# Patient Record
Sex: Male | Born: 1980 | Race: White | Hispanic: No | Smoking: Former smoker
Health system: Southern US, Community
[De-identification: ages and names within clinical notes are randomized; demographics above are authoritative.]

## PROBLEM LIST (undated history)

## (undated) DIAGNOSIS — Z87442 Personal history of urinary calculi: Secondary | ICD-10-CM

## (undated) DIAGNOSIS — R7303 Prediabetes: Secondary | ICD-10-CM

## (undated) DIAGNOSIS — R0600 Dyspnea, unspecified: Secondary | ICD-10-CM

## (undated) DIAGNOSIS — K219 Gastro-esophageal reflux disease without esophagitis: Secondary | ICD-10-CM

## (undated) DIAGNOSIS — G4730 Sleep apnea, unspecified: Secondary | ICD-10-CM

## (undated) DIAGNOSIS — J189 Pneumonia, unspecified organism: Secondary | ICD-10-CM

## (undated) DIAGNOSIS — I10 Essential (primary) hypertension: Secondary | ICD-10-CM

## (undated) DIAGNOSIS — I219 Acute myocardial infarction, unspecified: Secondary | ICD-10-CM

## (undated) DIAGNOSIS — J45909 Unspecified asthma, uncomplicated: Secondary | ICD-10-CM

## (undated) DIAGNOSIS — D869 Sarcoidosis, unspecified: Secondary | ICD-10-CM

## (undated) DIAGNOSIS — I82409 Acute embolism and thrombosis of unspecified deep veins of unspecified lower extremity: Secondary | ICD-10-CM

## (undated) HISTORY — PX: NO PAST SURGERIES: SHX2092

## (undated) HISTORY — PX: CARDIAC CATHETERIZATION: SHX172

## (undated) HISTORY — DX: Sarcoidosis, unspecified: D86.9

---

## 2019-11-13 ENCOUNTER — Emergency Department (HOSPITAL_COMMUNITY): Payer: Managed Care, Other (non HMO)

## 2019-11-13 ENCOUNTER — Encounter (HOSPITAL_COMMUNITY): Payer: Self-pay | Admitting: *Deleted

## 2019-11-13 ENCOUNTER — Other Ambulatory Visit: Payer: Self-pay

## 2019-11-13 ENCOUNTER — Emergency Department (HOSPITAL_COMMUNITY)
Admission: EM | Admit: 2019-11-13 | Discharge: 2019-11-13 | Disposition: A | Discharge status: Home / Self Care | Payer: Managed Care, Other (non HMO) | Attending: Emergency Medicine | Admitting: Emergency Medicine

## 2019-11-13 DIAGNOSIS — R911 Solitary pulmonary nodule: Secondary | ICD-10-CM | POA: Diagnosis not present

## 2019-11-13 DIAGNOSIS — M79602 Pain in left arm: Secondary | ICD-10-CM

## 2019-11-13 DIAGNOSIS — R531 Weakness: Secondary | ICD-10-CM | POA: Diagnosis not present

## 2019-11-13 DIAGNOSIS — R072 Precordial pain: Secondary | ICD-10-CM | POA: Insufficient documentation

## 2019-11-13 DIAGNOSIS — I10 Essential (primary) hypertension: Secondary | ICD-10-CM | POA: Insufficient documentation

## 2019-11-13 DIAGNOSIS — R590 Localized enlarged lymph nodes: Secondary | ICD-10-CM | POA: Diagnosis not present

## 2019-11-13 DIAGNOSIS — J45909 Unspecified asthma, uncomplicated: Secondary | ICD-10-CM | POA: Insufficient documentation

## 2019-11-13 DIAGNOSIS — R0789 Other chest pain: Secondary | ICD-10-CM | POA: Diagnosis present

## 2019-11-13 DIAGNOSIS — R29898 Other symptoms and signs involving the musculoskeletal system: Secondary | ICD-10-CM

## 2019-11-13 HISTORY — DX: Unspecified asthma, uncomplicated: J45.909

## 2019-11-13 HISTORY — DX: Acute embolism and thrombosis of unspecified deep veins of unspecified lower extremity: I82.409

## 2019-11-13 HISTORY — DX: Essential (primary) hypertension: I10

## 2019-11-13 LAB — CBC WITH DIFFERENTIAL/PLATELET
Abs Immature Granulocytes: 0.02 10*3/uL (ref 0.00–0.07)
Basophils Absolute: 0.1 10*3/uL (ref 0.0–0.1)
Basophils Relative: 1 %
Eosinophils Absolute: 0.2 10*3/uL (ref 0.0–0.5)
Eosinophils Relative: 3 %
HCT: 45.7 % (ref 39.0–52.0)
Hemoglobin: 14.4 g/dL (ref 13.0–17.0)
Immature Granulocytes: 0 %
Lymphocytes Relative: 27 %
Lymphs Abs: 2 10*3/uL (ref 0.7–4.0)
MCH: 26.8 pg (ref 26.0–34.0)
MCHC: 31.5 g/dL (ref 30.0–36.0)
MCV: 84.9 fL (ref 80.0–100.0)
Monocytes Absolute: 0.6 10*3/uL (ref 0.1–1.0)
Monocytes Relative: 8 %
Neutro Abs: 4.4 10*3/uL (ref 1.7–7.7)
Neutrophils Relative %: 61 %
Platelets: 229 10*3/uL (ref 150–400)
RBC: 5.38 MIL/uL (ref 4.22–5.81)
RDW: 13.2 % (ref 11.5–15.5)
WBC: 7.3 10*3/uL (ref 4.0–10.5)
nRBC: 0 % (ref 0.0–0.2)

## 2019-11-13 LAB — COMPREHENSIVE METABOLIC PANEL
ALT: 24 U/L (ref 0–44)
AST: 21 U/L (ref 15–41)
Albumin: 4 g/dL (ref 3.5–5.0)
Alkaline Phosphatase: 54 U/L (ref 38–126)
Anion gap: 10 (ref 5–15)
BUN: 21 mg/dL — ABNORMAL HIGH (ref 6–20)
CO2: 27 mmol/L (ref 22–32)
Calcium: 8.7 mg/dL — ABNORMAL LOW (ref 8.9–10.3)
Chloride: 103 mmol/L (ref 98–111)
Creatinine, Ser: 1.57 mg/dL — ABNORMAL HIGH (ref 0.61–1.24)
GFR calc Af Amer: >60 mL/min (ref >60)
GFR calc non Af Amer: 54 mL/min — ABNORMAL LOW (ref >60)
Glucose, Bld: 95 mg/dL (ref 70–99)
Potassium: 3.9 mmol/L (ref 3.5–5.1)
Sodium: 140 mmol/L (ref 135–145)
Total Bilirubin: 1 mg/dL (ref 0.3–1.2)
Total Protein: 7.1 g/dL (ref 6.5–8.1)

## 2019-11-13 LAB — TROPONIN I (HIGH SENSITIVITY)
Troponin I (High Sensitivity): 8 ng/L (ref <18)
Troponin I (High Sensitivity): 9 ng/L (ref <18)

## 2019-11-13 MED ORDER — IOHEXOL 350 MG/ML SOLN
100.0000 mL | Freq: Once | INTRAVENOUS | Status: AC | PRN
  Administered 2019-11-13: 14:00:00 100 mL via INTRAVENOUS

## 2019-11-13 NOTE — ED Notes (Signed)
Discharge instructions including neuro and follow up CT discussed with pt. Pt verbalized understanding with no question at this time. Pt ambulatory. To go home with family.

## 2019-11-13 NOTE — ED Notes (Signed)
Pt. Not available for assessment.

## 2019-11-13 NOTE — ED Provider Notes (Signed)
[facility] Emergency Department Provider Note MRN:  [id number]  Arrival date & time: 11/13/19     Chief Complaint   Arm Pain and Shortness of Breath   History of Present Illness   Zachary Weeks is a 38 y.o. year-old male with a history of hypertension, DVT presenting to the ED with chief complaint of arm pain and shortness of breath.  Sudden onset chest pain, shortness of breath, left arm pain upon awakening this morning.  Also endorsing some decreased strength through the left hand and arm.  Sent here after ED evaluation at [facility] hospital for MRI imaging.  Denies nausea or vomiting, no vision change, no leg numbness or weakness, no bowel or bladder dysfunction, no abdominal pain.  Pain is mild, constant, described as an ache, worse with motion.  Review of Systems  A complete 10 system review of systems was obtained and all systems are negative except as noted in the HPI and PMH.   Patient's Health History    Past Medical History:  Diagnosis Date  . Asthma   . DVT (deep venous thrombosis) ([place])   . Hypertension     History reviewed. No pertinent surgical history.  No family history on file.  Social History   Socioeconomic History  . Marital status: Married    Spouse name: Not on file  . Number of children: Not on file  . Years of education: Not on file  . Highest education level: Not on file  Occupational History  . Not on file  Tobacco Use  . Smoking status: Never Smoker  . Smokeless tobacco: Current User    Types: Chew  Substance and Sexual Activity  . Alcohol use: Yes    Comment: OCCASSIONAL  . Drug use: Never  . Sexual activity: Not on file  Other Topics Concern  . Not on file  Social History Narrative  . Not on file   Social Determinants of Health   Financial Resource Strain:   . Difficulty of Paying Living Expenses: Not on file  Food Insecurity:   . Worried About Charity fundraiser in the Last Year: Not on file  . Ran Out of Food  in the Last Year: Not on file  Transportation Needs:   . Lack of Transportation (Medical): Not on file  . Lack of Transportation (Non-Medical): Not on file  Physical Activity:   . Days of Exercise per Week: Not on file  . Minutes of Exercise per Session: Not on file  Stress:   . Feeling of Stress : Not on file  Social Connections:   . Frequency of Communication with Friends and Family: Not on file  . Frequency of Social Gatherings with Friends and Family: Not on file  . Attends Religious Services: Not on file  . Active Member of Clubs or Organizations: Not on file  . Attends Archivist Meetings: Not on file  . Marital Status: Not on file  Intimate Partner Violence:   . Fear of Current or Ex-Partner: Not on file  . Emotionally Abused: Not on file  . Physically Abused: Not on file  . Sexually Abused: Not on file     Physical Exam  Vital Signs and Nursing Notes reviewed Vitals:   11/13/19 1729 11/13/19 2000  BP: 122/78 111/73  Pulse: 78 78  Resp: 14 16  Temp: 98.1 F (36.7 C)   SpO2: 93% 100%    CONSTITUTIONAL: Well-appearing, NAD NEURO:  Alert and oriented x 3, normal sensation,  decreased grip strength to the left arm, no slurred speech, no aphasia, no neglect EYES:  eyes equal and reactive ENT/NECK:  no LAD, no JVD CARDIO: Regular rate, well-perfused, normal S1 and S2 PULM:  CTAB no wheezing or rhonchi GI/GU:  normal bowel sounds, non-distended, non-tender MSK/SPINE:  No gross deformities, no edema SKIN:  no rash, atraumatic PSYCH:  Appropriate speech and behavior  Diagnostic and Interventional Summary    EKG Interpretation  Date/Time:  Monday November 13 2019 12:24:58 EST Ventricular Rate:  85 PR Interval:    QRS Duration: 98 QT Interval:  388 QTC Calculation: 462 R Axis:   36 Text Interpretation: Sinus rhythm No STEMI Confirmed by Nanda Quinton [phone]) on 11/13/2019 2:54:02 PM      Labs Reviewed  COMPREHENSIVE METABOLIC PANEL - Abnormal; Notable  for the following components:      Result Value   BUN 21 (*)    Creatinine, Ser 1.57 (*)    Calcium 8.7 (*)    GFR calc non Af Amer 54 (*)    All other components within normal limits  CBC WITH DIFFERENTIAL/PLATELET  TROPONIN I (HIGH SENSITIVITY)  TROPONIN I (HIGH SENSITIVITY)    MR BRAIN WO CONTRAST  Final Result    MR Cervical Spine Wo Contrast  Final Result    CT Angio Chest PE W and/or Wo Contrast  Final Result    CT Head Wo Contrast  Final Result    CT Cervical Spine Wo Contrast  Final Result      Medications  iohexol (OMNIPAQUE) 350 MG/ML injection 100 mL (100 mLs Intravenous Contrast Given 11/13/19 1409)     Procedures  /  Critical Care Procedures  ED Course and Medical Decision Making  I have reviewed the triage vital signs and the nursing notes.  Pertinent labs & imaging results that were available during my care of the patient were reviewed by me and considered in my medical decision making (see below for details).     Transferred here for MRI to evaluate for cervical myelopathy versus stroke to explain his left arm weakness.  MRI cervical spine revealing some foraminal stenosis but no cord compression.  MRI brain revealing possible demyelinating lesions but no acute stroke.  Patient is appropriate for neurology follow-up.  Work-up is otherwise reassuring, no evidence of PE or dissection on CTA chest.  Strict return precautions.  Barth Kirks. Sedonia Small, [place] [email or web address]  Final Clinical Impressions(s) / ED Diagnoses     ICD-10-CM   1. Left hand weakness  R29.898   2. Precordial chest pain  R07.2   3. Left arm pain  M79.602   4. Mediastinal adenopathy  R59.0   5. Pulmonary nodule  R91.1     ED Discharge Orders         Ordered    Ambulatory referral to Neurology    Comments: An appointment is requested in approximately: 1 week   11/13/19 2253           Discharge Instructions Discussed  with and Provided to Patient:     Discharge Instructions     You were seen in the emergency department today with weakness in the left hand, arm pain, chest discomfort.  We transferred her down to [facility] for MRI.   Your CT scan of the chest did not show blood clot in the lungs but did show enlarged lymph nodes in the chest as well as a nodule in the lungs.  Radiology is recommending a repeat CT scan in 6 months to make sure this is not changing or looking concerning.  Please call your primary doctor tomorrow to discuss scheduling these imaging tests and getting close follow-up.        Maudie Flakes, MD 11/13/19 2303

## 2019-11-13 NOTE — Discharge Instructions (Addendum)
You were seen in the emergency department today with weakness in the left hand, arm pain, chest discomfort.  We transferred her down to [facility] for MRI.   Your CT scan of the chest did not show blood clot in the lungs but did show enlarged lymph nodes in the chest as well as a nodule in the lungs.  Radiology is recommending a repeat CT scan in 6 months to make sure this is not changing or looking concerning.  Please call your primary doctor tomorrow to discuss scheduling these imaging tests and getting close follow-up.

## 2019-11-13 NOTE — ED Provider Notes (Signed)
Emergency Department Provider Note   I have reviewed the triage vital signs and the nursing notes.   HISTORY  Chief Complaint Arm Pain and Shortness of Breath   HPI Zachary Weeks is a 38 y.o. male with past medical history of hypertension, asthma, newly diagnosed DVT, started on Eliquis 2 weeks ago, presents to the emergency department with chest discomfort starting this morning.  He describes his discomfort as a tightness over his center to right chest.  No pleuritic pain.  Patient continues to have discomfort in the right leg but states it is not worsening.  He has been compliant with his Eliquis since being diagnosed with DVT in the right leg.  Patient states he woke up this morning and in addition to chest tightness had weakness and "shooting, electrical pain in the LUE.  Patient feels some radiation from the neck on the left side.  No similar pain in the past.  Denies numbness but states his grip strength seemed significantly weaker compared to yesterday.  He had difficulty opening his bedroom door because of weakness.  He denies any symptoms of weakness or numbness in the left lower extremity.  No vision changes or headache.   Patient states that while he has been taking his Eliquis he was also advised by his primary doctor to continue taking naproxen as needed for pain in the right leg which she has been doing.   Past Medical History:  Diagnosis Date  . Asthma   . DVT (deep venous thrombosis) ([place])   . Hypertension     There are no problems to display for this patient.   History reviewed. No pertinent surgical history.  Allergies Patient has no known allergies.  No family history on file.  Social History Social History   Tobacco Use  . Smoking status: Never Smoker  . Smokeless tobacco: Current User    Types: Chew  Substance Use Topics  . Alcohol use: Yes    Comment: OCCASSIONAL  . Drug use: Never    Review of Systems  Constitutional: No fever/chills Eyes: No  visual changes. ENT: No sore throat. Cardiovascular: Positive chest pain. Respiratory: Denies shortness of breath. Gastrointestinal: No abdominal pain. No nausea, no vomiting.  No diarrhea.  No constipation. Genitourinary: Negative for dysuria. Musculoskeletal: Negative for back pain. Positive left arm pain.   Skin: Negative for rash. Neurological: Negative for headaches or numbness. Positive left arm/hand weakness.   10-point ROS otherwise negative.  ____________________________________________   PHYSICAL EXAM:  VITAL SIGNS: ED Triage Vitals [11/13/19 1123]  Enc Vitals Group     BP 116/90     Pulse Rate 79     Resp 20     Temp 98.1 F (36.7 C)     Temp src      SpO2 98 %   Constitutional: Alert and oriented. Well appearing and in no acute distress. Eyes: Conjunctivae are normal.  Head: Atraumatic. Nose: No congestion/rhinnorhea. Mouth/Throat: Mucous membranes are moist.  Neck: No stridor.   Cardiovascular: Normal rate, regular rhythm. Good peripheral circulation. Grossly normal heart sounds.   Respiratory: Normal respiratory effort.  No retractions. Lungs CTAB. Gastrointestinal: Soft and nontender. No distention.  Musculoskeletal: No lower extremity tenderness nor edema. No gross deformities of extremities. Neurologic:  Normal speech and language. 4/5 grip strength on the left along with 4/5 biceps/triceps. No drift. 5/5 strength in the RUE and bilateral LEs.  Skin:  Skin is warm, dry and intact. No rash noted.  ____________________________________________   Reva Bores (  all labs ordered are listed, but only abnormal results are displayed)  Labs Reviewed  COMPREHENSIVE METABOLIC PANEL - Abnormal; Notable for the following components:      Result Value   BUN 21 (*)    Creatinine, Ser 1.57 (*)    Calcium 8.7 (*)    GFR calc non Af Amer 54 (*)    All other components within normal limits  CBC WITH DIFFERENTIAL/PLATELET  TROPONIN I (HIGH SENSITIVITY)  TROPONIN I (HIGH  SENSITIVITY)   ____________________________________________  EKG   EKG Interpretation  Date/Time:  Monday November 13 2019 12:24:58 EST Ventricular Rate:  85 PR Interval:    QRS Duration: 98 QT Interval:  388 QTC Calculation: 462 R Axis:   36 Text Interpretation: Sinus rhythm No STEMI Confirmed by Nanda Quinton [id number]) on 11/13/2019 2:54:02 PM       ____________________________________________  RADIOLOGY  CT Head Wo Contrast  Result Date: 11/13/2019 CLINICAL DATA:  Neurological deficit, acute, suspected stroke EXAM: CT HEAD WITHOUT CONTRAST CT CERVICAL SPINE WITHOUT CONTRAST TECHNIQUE: Multidetector CT imaging of the head and cervical spine was performed following the standard protocol without intravenous contrast. Multiplanar CT image reconstructions of the cervical spine were also generated. COMPARISON:  None FINDINGS: CT HEAD FINDINGS Brain: Normal ventricular morphology. No midline shift or mass effect. Normal appearance of brain parenchyma. No intracranial hemorrhage, mass lesion, or evidence of acute infarction. No extra-axial fluid collections. Vascular: No hyperdense vessels Skull: Intact Sinuses/Orbits: Clear Other: N/A CT CERVICAL SPINE FINDINGS Alignment: Normal Skull base and vertebrae: Beam hardening artifacts at lower cervical spine secondary to shoulders. Skull base intact. Mineralization normal. Vertebral body heights maintained. No fracture, subluxation, or bone destruction. Soft tissues and spinal canal: Prevertebral soft tissues normal thickness. Disc levels:  No additional abnormalities Upper chest: Lung apices clear Other: N/A IMPRESSION: Normal CT head. Normal CT cervical spine. Electronically Signed   By: Lavonia Dana M.D.   On: 11/13/2019 14:36   CT Angio Chest PE W and/or Wo Contrast  Result Date: 11/13/2019 CLINICAL DATA:  Left arm pain, shortness of breath EXAM: CT ANGIOGRAPHY CHEST WITH CONTRAST TECHNIQUE: Multidetector CT imaging of the chest was performed  using the standard protocol during bolus administration of intravenous contrast. Multiplanar CT image reconstructions and MIPs were obtained to evaluate the vascular anatomy. CONTRAST:  123mL OMNIPAQUE IOHEXOL 350 MG/ML SOLN COMPARISON:  07/28/2006 FINDINGS: Cardiovascular: No filling defects in the pulmonary arteries to suggest pulmonary emboli. Heart is normal size. Aorta is normal caliber. Mediastinum/Nodes: Mildly enlarged bilateral hilar and mediastinal lymph nodes. Prevascular lymph node has a short axis diameter of 14 mm. Subcarinal lymph node has a short axis diameter of 19 mm. Right paratracheal lymph node has a short axis diameter of 12 mm. No axillary adenopathy. Lungs/Pleura: Minimal right middle lobe density medially, favor scarring as there was airspace disease in this area on prior study. No acute confluent opacities or effusions. Upper Abdomen: Imaging into the upper abdomen shows no acute findings. Musculoskeletal: Chest wall soft tissues are unremarkable. No acute bony abnormality. Review of the MIP images confirms the above findings. IMPRESSION: Mild mediastinal and bilateral hilar adenopathy, nonspecific. This could be related to inflammatory processes such as sarcoidosis, but cannot exclude lymphoproliferative disorder/lymphoma. Recommend follow-up CT in 6 months to assess stability. 5 mm right lower lobe peripheral nodule. No follow-up needed if patient is low-risk. Non-contrast chest CT can be considered in 12 months if patient is high-risk. This recommendation follows the consensus statement: Guidelines for Management of Incidental  Pulmonary Nodules Detected on CT Images: From the Fleischner Society 2017; Radiology 2017; [phone]. Right middle lobe density medially, likely scarring. No evidence of pulmonary embolus. Electronically Signed   By: Rolm Baptise M.D.   On: 11/13/2019 14:32   CT Cervical Spine Wo Contrast  Result Date: 11/13/2019 CLINICAL DATA:  Neurological deficit, acute,  suspected stroke EXAM: CT HEAD WITHOUT CONTRAST CT CERVICAL SPINE WITHOUT CONTRAST TECHNIQUE: Multidetector CT imaging of the head and cervical spine was performed following the standard protocol without intravenous contrast. Multiplanar CT image reconstructions of the cervical spine were also generated. COMPARISON:  None FINDINGS: CT HEAD FINDINGS Brain: Normal ventricular morphology. No midline shift or mass effect. Normal appearance of brain parenchyma. No intracranial hemorrhage, mass lesion, or evidence of acute infarction. No extra-axial fluid collections. Vascular: No hyperdense vessels Skull: Intact Sinuses/Orbits: Clear Other: N/A CT CERVICAL SPINE FINDINGS Alignment: Normal Skull base and vertebrae: Beam hardening artifacts at lower cervical spine secondary to shoulders. Skull base intact. Mineralization normal. Vertebral body heights maintained. No fracture, subluxation, or bone destruction. Soft tissues and spinal canal: Prevertebral soft tissues normal thickness. Disc levels:  No additional abnormalities Upper chest: Lung apices clear Other: N/A IMPRESSION: Normal CT head. Normal CT cervical spine. Electronically Signed   By: Lavonia Dana M.D.   On: 11/13/2019 14:36    ____________________________________________   PROCEDURES  Procedure(s) performed:   Procedures  None  ____________________________________________   INITIAL IMPRESSION / ASSESSMENT AND PLAN / ED COURSE  Pertinent labs & imaging results that were available during my care of the patient were reviewed by me and considered in my medical decision making (see chart for details).   Patient presents to the emergency department for evaluation of chest discomfort along with shooting pain in the left arm.  He does have some weakness in grip and biceps/triceps on that side.  Considered aortic pathology but feel this is far less likely.  He has normal blood pressure here and no ripping/tearing chest pain.  Pulses are equal.  Plan  for troponin, CTA PE scan given known history of DVT.  Ordered MRI of the head and cervical spine given his focal neurologic deficit.  Patient is too large to fit in our MRI. Checked with MRI tech and he will fit at [facility].   02:50 PM  CT imaging of the chest, head, C-spine reviewed.  Patient with bilateral adenopathy with recommendation for follow-up in 6 months for repeat imaging.  Discussed this with the patient and included in the AVS.  Patient also with pulmonary nodule.  No PE.  Discussed with Dr. Vanita Panda at [facility] who agrees with ED to ED transfer for MRI of the head and cervical spine to rule out midbrain stroke versus C-spine stenosis/compression given the new left arm/hand weakness. ____________________________________________  FINAL CLINICAL IMPRESSION(S) / ED DIAGNOSES  Final diagnoses:  Left hand weakness  Precordial chest pain  Left arm pain  Mediastinal adenopathy  Pulmonary nodule    MEDICATIONS GIVEN DURING THIS VISIT:  Medications  iohexol (OMNIPAQUE) 350 MG/ML injection 100 mL (100 mLs Intravenous Contrast Given 11/13/19 1409)     Note:  This document was prepared using Dragon voice recognition software and may include unintentional dictation errors.  Nanda Quinton, MD, [facility] Emergency Medicine    Uchenna Seufert, [place], MD 11/13/19 [phone]

## 2019-11-13 NOTE — ED Triage Notes (Signed)
C/o left arm pain and shortness of breath onset today, states he has had muscle cramps for a week, history of DVT

## 2019-11-13 NOTE — ED Notes (Signed)
In room just as pt leaving for MRI

## 2019-11-13 NOTE — ED Notes (Signed)
To CT

## 2019-11-15 ENCOUNTER — Encounter: Payer: Self-pay | Admitting: Diagnostic Neuroimaging

## 2019-11-15 ENCOUNTER — Other Ambulatory Visit: Payer: Self-pay

## 2019-11-15 ENCOUNTER — Ambulatory Visit: Payer: Managed Care, Other (non HMO) | Admitting: Diagnostic Neuroimaging

## 2019-11-15 ENCOUNTER — Telehealth: Payer: Self-pay | Admitting: Diagnostic Neuroimaging

## 2019-11-15 VITALS — BP 142/92 | HR 98 | Temp 97.3°F | Ht 73.0 in | Wt 320.0 lb

## 2019-11-15 DIAGNOSIS — M79642 Pain in left hand: Secondary | ICD-10-CM

## 2019-11-15 DIAGNOSIS — R29898 Other symptoms and signs involving the musculoskeletal system: Secondary | ICD-10-CM

## 2019-11-15 NOTE — Telephone Encounter (Signed)
Called patient and advised him of Dr [employer] message. I advised he should get on [facility] schedule so he has an appointment. He can be put on wait list as well. I advised will let Lovena Le know to schedule him. Patient verbalized understanding, appreciation.

## 2019-11-15 NOTE — Telephone Encounter (Signed)
Patient needs NCV/EMG. I advised patient at check-out that the next available would be in February. Patient states that MD told him that testing would be first of the year. I advised patient that I would need to check with MD to see if he has something specific in mind for patient. Please advise

## 2019-11-15 NOTE — Progress Notes (Signed)
[facility]  PATIENT: Zachary Weeks DOB: 17-Mar-1981  REFERRING CLINICIAN: Caryl Bis, MD   HISTORY FROM: patient  REASON FOR VISIT: new consult    HISTORICAL  CHIEF COMPLAINT:  Chief Complaint  Patient presents with  . Left arm pain, left hand weakness    rm 7 New Pt, "symptoms started 3 days ago seen in ED yesterday for work up with imaging, recent dx DVT in leg"    HISTORY OF PRESENT ILLNESS:   38 year old male here for evaluation of left arm weakness and pain.  About 2 weeks ago patient went to PCP for right leg pain and swelling, diagnosed with right leg DVT.  He has been on Eliquis for past 2 weeks.  11/13/2019 patient woke up felt tightness in the chest and left arm pain and numbness.  He felt weakness in his left grip.  Patient went to the hospital for evaluation.  He was transferred to another hospital for MRI.  MRI of the brain and cervical spine showed no acute findings.  Left C7-T1 foraminal stenosis was noted.  Also patient was noted to have mediastinal lymphadenopathy on CT angiogram of the chest, planning to have follow-up with PCP.  Patient was stable and discharged home.  No prodromal factors.  No increase or change in physical activity, stress, medications or accidents.  Symptoms are stable.  Left arm continues to have some weakness in grip strength with intermittent shooting pain up his left arm to his left shoulder.  No shortness of breath or chest pain.    REVIEW OF SYSTEMS: Full 14 system review of systems performed and negative with exception of: As per HPI.  ALLERGIES: No Known Allergies  HOME MEDICATIONS: Outpatient Medications Prior to Visit  Medication Sig Dispense Refill  . albuterol (VENTOLIN HFA) 108 (90 Base) MCG/ACT inhaler Inhale 1-2 puffs into the lungs every 6 (six) hours as needed for wheezing or shortness of breath.    Marland Kitchen atorvastatin (LIPITOR) 20 MG tablet Take 20 mg by mouth daily.    Marland Kitchen ELIQUIS 5 MG TABS tablet  Take 5 mg by mouth 2 (two) times daily.    . pantoprazole (PROTONIX) 40 MG tablet Take 40 mg by mouth daily.    . valsartan-hydrochlorothiazide (DIOVAN-HCT) 160-12.5 MG tablet Take 1 tablet by mouth daily.    . naproxen (NAPROSYN) 500 MG tablet Take 500 mg by mouth 2 (two) times daily as needed for moderate pain.     No facility-administered medications prior to visit.    PAST MEDICAL HISTORY: Past Medical History:  Diagnosis Date  . Asthma   . DVT (deep venous thrombosis) ([place])   . Hypertension     PAST SURGICAL HISTORY: No past surgical history on file.  FAMILY HISTORY: Family History  Problem Relation Age of Onset  . Pulmonary fibrosis Mother   . Cancer Mother   . Cancer Father        prostate  . Hypertension Father   . Lupus Maternal Grandmother   . Other Maternal Grandmother        clotting disorder  . Fibromyalgia Maternal Aunt   . Other Other        clotting disorder    SOCIAL HISTORY: Social History   Socioeconomic History  . Marital status: Married    Spouse name: Not on file  . Number of children: 2  . Years of education: Not on file  . Highest education level: High school graduate  Occupational History  . Not on  file  Tobacco Use  . Smoking status: Never Smoker  . Smokeless tobacco: Current User    Types: Chew, Snuff  Substance and Sexual Activity  . Alcohol use: Yes    Comment: OCCASSIONAL  . Drug use: Never  . Sexual activity: Not on file  Other Topics Concern  . Not on file  Social History Narrative   Lives with family   Caffeine- occass soda   Social Determinants of Health   Financial Resource Strain:   . Difficulty of Paying Living Expenses: Not on file  Food Insecurity:   . Worried About Charity fundraiser in the Last Year: Not on file  . Ran Out of Food in the Last Year: Not on file  Transportation Needs:   . Lack of Transportation (Medical): Not on file  . Lack of Transportation (Non-Medical): Not on file  Physical Activity:     . Days of Exercise per Week: Not on file  . Minutes of Exercise per Session: Not on file  Stress:   . Feeling of Stress : Not on file  Social Connections:   . Frequency of Communication with Friends and Family: Not on file  . Frequency of Social Gatherings with Friends and Family: Not on file  . Attends Religious Services: Not on file  . Active Member of Clubs or Organizations: Not on file  . Attends Archivist Meetings: Not on file  . Marital Status: Not on file  Intimate Partner Violence:   . Fear of Current or Ex-Partner: Not on file  . Emotionally Abused: Not on file  . Physically Abused: Not on file  . Sexually Abused: Not on file     PHYSICAL EXAM  GENERAL EXAM/CONSTITUTIONAL: Vitals:  Vitals:   11/15/19 0821  BP: (!) 142/92  Pulse: 98  Temp: (!) 97.3 F (36.3 C)  Weight: (!) 320 lb (145.2 kg)  Height: 6\' 1"  (1.854 m)     Body mass index is 42.22 kg/m. Wt Readings from Last 3 Encounters:  11/15/19 (!) 320 lb (145.2 kg)     Patient is in no distress; well developed, nourished and groomed; neck is supple  CARDIOVASCULAR:  Examination of carotid arteries is normal; no carotid bruits  Regular rate and rhythm, no murmurs  Examination of peripheral vascular system by observation and palpation is normal  EYES:  Ophthalmoscopic exam of optic discs and posterior segments is normal; no papilledema or hemorrhages  No exam data present  MUSCULOSKELETAL:  Gait, strength, tone, movements noted in Neurologic exam below  NEUROLOGIC: MENTAL STATUS:  No flowsheet data found.  awake, alert, oriented to person, place and time  recent and remote memory intact  normal attention and concentration  language fluent, comprehension intact, naming intact  fund of knowledge appropriate  CRANIAL NERVE:   2nd - no papilledema on fundoscopic exam  2nd, 3rd, 4th, 6th - pupils equal and reactive to light, visual fields full to confrontation, extraocular  muscles intact, no nystagmus  5th - facial sensation symmetric  7th - facial strength symmetric  8th - hearing intact  9th - palate elevates symmetrically, uvula midline  11th - shoulder shrug symmetric  12th - tongue protrusion midline  MOTOR:   normal bulk and tone, full strength in the BUE, BLE; EXCEPT DECR LEFT HAND FINGER ABDUCTION AND LEFT HAND GRIP (DIGITS 3-5)  SENSORY:   normal and symmetric to light touch, temperature, vibration  COORDINATION:   finger-nose-finger, fine finger movements normal  REFLEXES:   deep  tendon reflexes TRACE and symmetric  GAIT/STATION:   narrow based gait     DIAGNOSTIC DATA (LABS, IMAGING, TESTING) - I reviewed patient records, labs, notes, testing and imaging myself where available.  Lab Results  Component Value Date   WBC 7.3 11/13/2019   HGB 14.4 11/13/2019   HCT 45.7 11/13/2019   MCV 84.9 11/13/2019   PLT 229 11/13/2019      Component Value Date/Time   NA 140 11/13/2019 1230   K 3.9 11/13/2019 1230   CL 103 11/13/2019 1230   CO2 27 11/13/2019 1230   GLUCOSE 95 11/13/2019 1230   BUN 21 (H) 11/13/2019 1230   CREATININE 1.57 (H) 11/13/2019 1230   CALCIUM 8.7 (L) 11/13/2019 1230   PROT 7.1 11/13/2019 1230   ALBUMIN 4.0 11/13/2019 1230   AST 21 11/13/2019 1230   ALT 24 11/13/2019 1230   ALKPHOS 54 11/13/2019 1230   BILITOT 1.0 11/13/2019 1230   GFRNONAA 54 (L) 11/13/2019 1230   GFRAA >60 11/13/2019 1230   No results found for: CHOL, HDL, LDLCALC, LDLDIRECT, TRIG, CHOLHDL No results found for: HGBA1C No results found for: VITAMINB12 No results found for: TSH   11/13/19 CTA CHEST - Mild mediastinal and bilateral hilar adenopathy, nonspecific. This could be related to inflammatory processes such as sarcoidosis, but cannot exclude lymphoproliferative disorder/lymphoma. Recommend follow-up CT in 6 months to assess stability. - 5 mm right lower lobe peripheral nodule. No follow-up needed if patient is low-risk.  Non-contrast chest CT can be considered in 12 months if patient is high-risk. This recommendation follows the consensus statement: Guidelines for Management of Incidental Pulmonary Nodules Detected on CT Images: From the Fleischner Society 2017; Radiology 2017; 284:228-243.  Right middle lobe density medially, likely scarring.  - No evidence of pulmonary embolus.  11/13/19 MRI brain [I reviewed images myself. Minimal gliosis, but not likely demyelinating. -VRP]  - No evidence of recent infarction, intracranial hemorrhage, or mass. Minimal foci of nonspecific gliosis/demyelination in the cerebral white matter.   11/13/19 MRI cervical spine [I reviewed images myself and agree with interpretation. Moderate left C7-T1 foraminal stenosis.  -VRP]  - Multilevel degenerative changes as detailed above. No high-grade canal stenosis. Left foraminal stenosis is greatest at C7-T1. There is no abnormal cord signal.    ASSESSMENT AND PLAN  38 y.o. year old male here with new onset left arm pain and weakness, on 11/13/2019.  Could represent peripheral nerve process (cervical radiculopathy, brachial plexopathy, ulnar neuropathy).  We will proceed with EMG nerve conduction study.  Also recommend follow-up with PCP regarding mediastinal lymphadenopathy and rule out autoimmune, inflammatory or neoplastic processes.  Ddx: left C8-T1 radiculopathy vs left ulnar neuropathy  1. Left arm weakness   2. Left hand pain     PLAN:  - check EMG/NCS - follow up mediastinal lymphadenopathy with PCP  Orders Placed This Encounter  Procedures  . NCV with EMG(electromyography)   Return for for NCV/EMG.    Penni Bombard, MD 0000000, 0000000 AM Certified in Neurology, Neurophysiology and Neuroimaging  [facility] [address] [place], [place] [address] [phone]

## 2019-11-15 NOTE — Patient Instructions (Signed)
-   check EMG/NCS (electrical nerve testing)  - follow up mediastinal lymphadenopathy (enlarged lymph notes) with Dr. Quillian Quince (PCP)

## 2019-11-15 NOTE — Telephone Encounter (Signed)
Pls advise pt that we will try to work patient in sooner (if any cancellations). Also will request if Dr Krista Blue has any availability. -VRP

## 2019-12-04 NOTE — Telephone Encounter (Signed)
Pt is scheduled on 12/06/19 with Dr. Jannifer Franklin.

## 2019-12-06 ENCOUNTER — Ambulatory Visit (INDEPENDENT_AMBULATORY_CARE_PROVIDER_SITE_OTHER): Payer: Managed Care, Other (non HMO) | Admitting: Neurology

## 2019-12-06 ENCOUNTER — Other Ambulatory Visit: Payer: Self-pay

## 2019-12-06 ENCOUNTER — Ambulatory Visit: Payer: Managed Care, Other (non HMO) | Admitting: Neurology

## 2019-12-06 ENCOUNTER — Encounter: Payer: Self-pay | Admitting: Neurology

## 2019-12-06 DIAGNOSIS — R29898 Other symptoms and signs involving the musculoskeletal system: Secondary | ICD-10-CM

## 2019-12-06 DIAGNOSIS — G540 Brachial plexus disorders: Secondary | ICD-10-CM

## 2019-12-06 DIAGNOSIS — M79642 Pain in left hand: Secondary | ICD-10-CM

## 2019-12-06 HISTORY — DX: Brachial plexus disorders: G54.0

## 2019-12-06 NOTE — Progress Notes (Signed)
Please refer to EMG and nerve conduction procedure note.  

## 2019-12-06 NOTE — Procedures (Signed)
     HISTORY:  Zachary Weeks is a 39 year old gentleman with a spontaneous onset in mid December of discomfort in the left shoulder and arm with onset of weakness in the left hand.  The pain has resolved but the left hand weakness has continued.  The patient also reports a history of enlarged lymph nodes in the chest.  The patient is being evaluated for a possible neuropathy or a cervical radiculopathy.  He denies any neck pain.  NERVE CONDUCTION STUDIES:  Nerve conduction studies were performed on both upper extremities. The distal motor latencies and motor amplitudes for the median and ulnar nerves were within normal limits. The nerve conduction velocities for these nerves were also normal. The sensory latencies for the median, radial, and ulnar nerves were normal. The F wave latencies for the ulnar nerves were within normal limits.   EMG STUDIES:  EMG study was performed on the left upper extremity:  The first dorsal interosseous muscle reveals 2 to 4 K units with decreased recruitment. 2+ positive waves were noted. The abductor pollicis brevis muscle reveals 2 to 4 K units with decreased recruitment. 1+ positive waves were noted. The extensor indicis proprius muscle reveals 1 to 3 K units with decreased recruitment. 2+ positive waves were noted. The pronator teres muscle reveals 2 to 3 K units with decreased recruitment. 3+ positive waves were noted. The biceps muscle reveals 1 to 2 K units with full recruitment. No fibrillations or positive waves were noted. The triceps muscle reveals 2 to 4 K units with full recruitment. No fibrillations or positive waves were noted. The anterior deltoid muscle reveals 2 to 3 K units with full recruitment. No fibrillations or positive waves were noted. The cervical paraspinal muscles were tested at 2 levels. No abnormalities of insertional activity were seen at either level tested. There was good relaxation.   IMPRESSION:  Nerve conduction studies  done on both upper extremities were within normal limits.  No evidence of neuropathy is seen.  EMG evaluation of the left upper extremity shows denervation below the elbow diffusely involving muscles innervated by the C7, C8, and T1 nerve roots.  Sparing of the triceps muscle however is seen.  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  Clinical correlation is required.  Jill Alexanders MD 12/06/2019 2:28 PM  [facility] [address] [place], [place] [address]  Phone [phone] Fax [phone]

## 2019-12-11 NOTE — Progress Notes (Signed)
[place]    Nerve / Sites Muscle Latency Ref. Amplitude Ref. Rel Amp Segments Distance Velocity Ref. Area    ms ms mV mV %  cm m/s m/s mVms  R Median - APB     Wrist APB 3.2 ?4.4 13.6 ?4.0 100 Wrist - APB 7   59.8     Upper arm APB 7.8  12.6  92.7 Upper arm - Wrist 23 50 ?49 55.0  L Median - APB     Wrist APB 3.1 ?4.4 10.8 ?4.0 100 Wrist - APB 7   43.0     Upper arm APB 7.1  10.7  99.3 Upper arm - Wrist 23 58 ?49 41.5  R Ulnar - ADM     Wrist ADM 3.0 ?3.3 3.1 ?6.0 100 Wrist - ADM 7   8.5     B.Elbow ADM 6.1  7.0  224 B.Elbow - Wrist 22 69 ?49 29.8     A.Elbow ADM 8.1  7.3  105 A.Elbow - B.Elbow 10 52 ?49 29.4         A.Elbow - Wrist      L Ulnar - ADM     Wrist ADM 2.9 ?3.3 8.2 ?6.0 100 Wrist - ADM 7   32.2     B.Elbow ADM 6.7  8.3  100 B.Elbow - Wrist 22 58 ?49 32.1     A.Elbow ADM 8.7  8.0  96.3 A.Elbow - B.Elbow 10 49 ?49 31.6         A.Elbow - Wrist                 SNC    Nerve / Sites Rec. Site Peak Lat Ref.  Amp Ref. Segments Distance Peak Diff Ref.    ms ms V V  cm ms ms  R Radial - Anatomical snuff box (Forearm)     Forearm Wrist 2.1 ?2.9 26 ?15 Forearm - Wrist 10    L Radial - Anatomical snuff box (Forearm)     Forearm Wrist 1.9 ?2.9 28 ?15 Forearm - Wrist 10    R Median, Ulnar - Transcarpal comparison     Median Palm Wrist 1.8 ?2.2 84 ?35 Median Palm - Wrist 8       Ulnar Palm Wrist 1.9 ?2.2 19 ?12 Ulnar Palm - Wrist 8          Median Palm - Ulnar Palm  -0.1 ?0.4  L Median, Ulnar - Transcarpal comparison     Median Palm Wrist 1.8 ?2.2 101 ?35 Median Palm - Wrist 8       Ulnar Palm Wrist 1.7 ?2.2 12 ?12 Ulnar Palm - Wrist 8          Median Palm - Ulnar Palm  0.1 ?0.4  R Median - Orthodromic (Dig II, Mid palm)     Dig II Wrist 2.9 ?3.4 12 ?10 Dig II - Wrist 13    L Median - Orthodromic (Dig II, Mid palm)     Dig II Wrist 2.8 ?3.4 19 ?10 Dig II - Wrist 13    R Ulnar - Orthodromic, (Dig V, Mid palm)     Dig V Wrist 2.4 ?3.1 5 ?5 Dig V - Wrist 11    L Ulnar - Orthodromic,  (Dig V, Mid palm)     Dig V Wrist 2.5 ?3.1 6 ?5 Dig V - Wrist 11  F  Wave    Nerve F Lat Ref.   ms ms  R Ulnar - ADM 28.1 ?32.0  L Ulnar - ADM 30.4 ?32.0

## 2020-01-04 ENCOUNTER — Encounter: Payer: Managed Care, Other (non HMO) | Admitting: Diagnostic Neuroimaging

## 2020-01-19 NOTE — Progress Notes (Signed)
Emg suggests brachial plexopathy. Patient also has mediastinal lymphadenopathy on CT chest. These could be related. Recommend PCP evaluation of lymphadenopathy. May need biopsy. -VRP

## 2020-02-12 ENCOUNTER — Telehealth: Payer: Self-pay | Admitting: *Deleted

## 2020-02-12 NOTE — Telephone Encounter (Signed)
Called patient and advised him the  Emg suggests brachial plexopathy. Dr Leta Baptist stated he also has mediastinal lymphadenopathy on CT chest. These could be related. Dr Leta Baptist recommends  PCP evaluation of lymphadenopathy. He may need biopsy. Patient stated his PCP didn't get copy of EMG results. EMG was ordered by [facility] hospital MD. I advised I will fax copy to PCP if he agrees; patient agreed. He state dhe has othre symptoms that are concerning him, and he wants to discuss with PCP. I advised him Il'l fax results today. Patient verbalized understanding, appreciation. EMG/NCS faxed to Dr Quillian Quince, f [phone].

## 2020-10-18 ENCOUNTER — Ambulatory Visit: Payer: Managed Care, Other (non HMO) | Admitting: Pulmonary Disease

## 2020-10-18 ENCOUNTER — Encounter: Payer: Self-pay | Admitting: Pulmonary Disease

## 2020-10-18 ENCOUNTER — Other Ambulatory Visit: Payer: Self-pay

## 2020-10-18 VITALS — BP 120/90 | HR 91 | Temp 97.9°F | Ht 72.0 in | Wt 335.0 lb

## 2020-10-18 DIAGNOSIS — R590 Localized enlarged lymph nodes: Secondary | ICD-10-CM | POA: Diagnosis not present

## 2020-10-18 DIAGNOSIS — G4733 Obstructive sleep apnea (adult) (pediatric): Secondary | ICD-10-CM | POA: Insufficient documentation

## 2020-10-18 DIAGNOSIS — N183 Chronic kidney disease, stage 3 unspecified: Secondary | ICD-10-CM | POA: Insufficient documentation

## 2020-10-18 DIAGNOSIS — N182 Chronic kidney disease, stage 2 (mild): Secondary | ICD-10-CM | POA: Diagnosis not present

## 2020-10-18 DIAGNOSIS — N1831 Chronic kidney disease, stage 3a: Secondary | ICD-10-CM

## 2020-10-18 DIAGNOSIS — D869 Sarcoidosis, unspecified: Secondary | ICD-10-CM | POA: Insufficient documentation

## 2020-10-18 NOTE — Assessment & Plan Note (Signed)
Differential of mediastinal and hilar lymphadenopathy includes benign condition such as sarcoidosis, less likely subacute infection since he has always lived in [place], unlikely malignancy and this never smoker, no other lymphadenopathy to suggest lymphoma and low-grade hypermetabolism on PET scan. He has mild oxygen desaturation not to the point of requiring oxygen.  He does not have any parenchymal involvement on imaging.  The strategy here would be to proceed with bronchoscopy with EBUS to see if we can diagnose sarcoidosis.  Skin lesions do not seem to be significant enough to biopsy.  We will also schedule PFTs to get a sense of baseline lung function.  If biopsy shows granulomas we will proceed with treatment with steroids

## 2020-10-18 NOTE — Assessment & Plan Note (Signed)
High pretest probability of OSA Proceed with home sleep testing   The pathophysiology of obstructive sleep apnea , it's cardiovascular consequences & modes of treatment including CPAP were discused with the patient in detail & they evidenced understanding.

## 2020-10-18 NOTE — Assessment & Plan Note (Signed)
Nephrology consultation in [place]

## 2020-10-18 NOTE — Patient Instructions (Addendum)
  Ambulatory satn Home sleep study has been scheduled Schedule PFTs Schedule bronchoscopy with biopsy of lymph nodes in [facility]  Renal consultation

## 2020-10-18 NOTE — Progress Notes (Signed)
Subjective:    Patient ID: Zachary Weeks, male    DOB: 1981/01/02, 39 y.o.   MRN: [id number]  HPI  Chief Complaint  Patient presents with  . Consult    Patient is here for shortness of breath and feels like he can't catch his breath sometimes. Dry cough, going on for a little over a year.     39 year old obese Man referred for evaluation of mediastinal and hilar lymphadenopathy. He presented to the ED 11/13/2019 with left arm pain, weakness and shortness of breath.  Underwent neurological evaluation and CT angiogram chest which picked up mediastinal and hilar lymphadenopathy -subcarinal, prevascular.  Subsequent neurological evaluation included EMG, MRI and no clear cause was found for his and weakness, this was attributed to brachial plexus involvement.  He also developed right lower extremity DVT in 10/2019 and was on Eliquis for a period of time.  Underwent hematology evaluation and detailed hypercoagulable work-up which was mostly negative except PCR-ABL positive with suggested CML.  He underwent bone marrow biopsy 02/2020 which did not show any evidence of leukemia and repeat BCR ABL was negative.  I have reviewed hematology evaluation. He also underwent PET scan 04/2020 which showed mild hypermetabolism in mediastinal and hilar lymphadenopathy favored to represent benign changes.  Repeat CT chest with contrast 08/2020 showed persistent enlarged lymph nodes  08/2020 BUN/creatinine 15/1.67  He reports shortness of breath on walking and other activities for the past year which has been stable and nonprogressive.  He also reports a dry cough.  Reports skin lesions on his right shin which he believes are healed tick bites no redness of eyes  He was asked by the hematologist discontinue Eliquis  Wife is noted loud snoring and he reports weight gain in the past 2 years.  No witnessed apneas or gasping or choking episodes in his sleep  PMH -reflux, hypertension, undergoing evaluation for sleep  apnea  Ambulatory saturation-oxygen saturation was 92% on room air at rest and desaturated 91% on walking Significant tests/ events reviewed  10/2019 CT angio chest >> Mild mediastinal and bilateral hilar adenopathy, Prevascular lymph node has a short axis diameter of 14 mm. Subcarinal lymph node has a short axis diameter of 19 mm. Right paratracheal lymph node has a short axis diameter of 12 mm. No axillary adenopathy. 5 mm right lower lobe peripheral nodule  PET 04/2020 >> Mediastinal and bilateral hilar lymphadenopathy with associated increased metabolic activity, favored to represent benign, reactive changes.  CT chest W con 08/2020 Unchanged enlarged mediastinal and bilateral hilar lymph nodes,  which remain nonspecific    Past Medical History:  Diagnosis Date  . Asthma   . DVT (deep venous thrombosis) ([place])   . Hypertension   . 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     Review of Systems   Shortness of breath with activity Nonproductive cough Acid heartburn Weight gain  Constitutional: negative for anorexia, fevers and sweats  Eyes: negative for irritation, redness and visual disturbance  Ears, nose, mouth, throat, and face: negative for earaches, epistaxis, nasal congestion and sore throat  Respiratory: negative for sputum and wheezing  Cardiovascular: negative for chest pain,  lower extremity edema, orthopnea, palpitations and syncope  Gastrointestinal: negative for abdominal pain, constipation, diarrhea, melena, nausea and vomiting  Genitourinary:negative for dysuria, frequency and hematuria  Hematologic/lymphatic: negative for bleeding, easy bruising and lymphadenopathy  Musculoskeletal:negative for arthralgias, muscle weakness and stiff joints  Neurological: negative for coordination problems, gait problems, headaches and weakness  Endocrine: negative for diabetic symptoms  including polydipsia, polyuria and weight loss     Objective:   Physical Exam  Gen. Pleasant,  obese, in no distress, normal affect ENT - no pallor,icterus, no post nasal drip, class 2-3 airway Neck: No JVD, no thyromegaly, no carotid bruits Lungs: no use of accessory muscles, no dullness to percussion, decreased without rales or rhonchi  Cardiovascular: Rhythm regular, heart sounds  normal, no murmurs or gallops, no peripheral edema Abdomen: soft and non-tender, no hepatosplenomegaly, BS normal. Musculoskeletal: No deformities, no cyanosis or clubbing Neuro:  alert, non focal, no tremors SKin- lesions on RT shin ? Healing tick bites      Assessment & Plan:

## 2020-10-18 NOTE — H&P (View-Only) (Signed)
Subjective:    Patient ID: Zachary Weeks, male    DOB: 02/28/1981, 39 y.o.   MRN: [id number]  HPI  Chief Complaint  Patient presents with  . Consult    Patient is here for shortness of breath and feels like he can't catch his breath sometimes. Dry cough, going on for a little over a year.     39 year old obese Man referred for evaluation of mediastinal and hilar lymphadenopathy. He presented to the ED 11/13/2019 with left arm pain, weakness and shortness of breath.  Underwent neurological evaluation and CT angiogram chest which picked up mediastinal and hilar lymphadenopathy -subcarinal, prevascular.  Subsequent neurological evaluation included EMG, MRI and no clear cause was found for his and weakness, this was attributed to brachial plexus involvement.  He also developed right lower extremity DVT in 10/2019 and was on Eliquis for a period of time.  Underwent hematology evaluation and detailed hypercoagulable work-up which was mostly negative except PCR-ABL positive with suggested CML.  He underwent bone marrow biopsy 02/2020 which did not show any evidence of leukemia and repeat BCR ABL was negative.  I have reviewed hematology evaluation. He also underwent PET scan 04/2020 which showed mild hypermetabolism in mediastinal and hilar lymphadenopathy favored to represent benign changes.  Repeat CT chest with contrast 08/2020 showed persistent enlarged lymph nodes  08/2020 BUN/creatinine 15/1.67  He reports shortness of breath on walking and other activities for the past year which has been stable and nonprogressive.  He also reports a dry cough.  Reports skin lesions on his right shin which he believes are healed tick bites no redness of eyes  He was asked by the hematologist discontinue Eliquis  Wife is noted loud snoring and he reports weight gain in the past 2 years.  No witnessed apneas or gasping or choking episodes in his sleep  PMH -reflux, hypertension, undergoing evaluation for sleep  apnea  Ambulatory saturation-oxygen saturation was 92% on room air at rest and desaturated 91% on walking Significant tests/ events reviewed  10/2019 CT angio chest >> Mild mediastinal and bilateral hilar adenopathy, Prevascular lymph node has a short axis diameter of 14 mm. Subcarinal lymph node has a short axis diameter of 19 mm. Right paratracheal lymph node has a short axis diameter of 12 mm. No axillary adenopathy. 5 mm right lower lobe peripheral nodule  PET 04/2020 >> Mediastinal and bilateral hilar lymphadenopathy with associated increased metabolic activity, favored to represent benign, reactive changes.  CT chest W con 08/2020 Unchanged enlarged mediastinal and bilateral hilar lymph nodes,  which remain nonspecific    Past Medical History:  Diagnosis Date  . Asthma   . DVT (deep venous thrombosis) ([place])   . Hypertension   . 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     Review of Systems   Shortness of breath with activity Nonproductive cough Acid heartburn Weight gain  Constitutional: negative for anorexia, fevers and sweats  Eyes: negative for irritation, redness and visual disturbance  Ears, nose, mouth, throat, and face: negative for earaches, epistaxis, nasal congestion and sore throat  Respiratory: negative for sputum and wheezing  Cardiovascular: negative for chest pain,  lower extremity edema, orthopnea, palpitations and syncope  Gastrointestinal: negative for abdominal pain, constipation, diarrhea, melena, nausea and vomiting  Genitourinary:negative for dysuria, frequency and hematuria  Hematologic/lymphatic: negative for bleeding, easy bruising and lymphadenopathy  Musculoskeletal:negative for arthralgias, muscle weakness and stiff joints  Neurological: negative for coordination problems, gait problems, headaches and weakness  Endocrine: negative for diabetic symptoms  including polydipsia, polyuria and weight loss     Objective:   Physical Exam  Gen. Pleasant,  obese, in no distress, normal affect ENT - no pallor,icterus, no post nasal drip, class 2-3 airway Neck: No JVD, no thyromegaly, no carotid bruits Lungs: no use of accessory muscles, no dullness to percussion, decreased without rales or rhonchi  Cardiovascular: Rhythm regular, heart sounds  normal, no murmurs or gallops, no peripheral edema Abdomen: soft and non-tender, no hepatosplenomegaly, BS normal. Musculoskeletal: No deformities, no cyanosis or clubbing Neuro:  alert, non focal, no tremors SKin- lesions on RT shin ? Healing tick bites      Assessment & Plan:

## 2020-10-23 ENCOUNTER — Telehealth: Payer: Self-pay | Admitting: *Deleted

## 2020-10-23 NOTE — Telephone Encounter (Signed)
-----   Message from Joellen Jersey sent at 10/21/2020  9:05 AM EST ----- Regarding: bronch/ebus Bronch/ebus@[facility] 11/01/20@7 :30am(only time to be done) covid 10/29/20@10 :15

## 2020-10-29 ENCOUNTER — Encounter: Payer: Self-pay | Admitting: Pulmonary Disease

## 2020-10-29 ENCOUNTER — Other Ambulatory Visit (HOSPITAL_COMMUNITY)
Admission: RE | Admit: 2020-10-29 | Discharge: 2020-10-29 | Disposition: A | Discharge status: Home / Self Care | Payer: Managed Care, Other (non HMO) | Source: Ambulatory Visit | Attending: Pulmonary Disease | Admitting: Pulmonary Disease

## 2020-10-29 DIAGNOSIS — Z20822 Contact with and (suspected) exposure to covid-19: Secondary | ICD-10-CM | POA: Diagnosis not present

## 2020-10-29 DIAGNOSIS — Z01812 Encounter for preprocedural laboratory examination: Secondary | ICD-10-CM | POA: Insufficient documentation

## 2020-10-29 LAB — SARS CORONAVIRUS 2 (TAT 6-24 HRS): SARS Coronavirus 2: NEGATIVE

## 2020-10-31 ENCOUNTER — Encounter (HOSPITAL_COMMUNITY): Payer: Self-pay | Admitting: Pulmonary Disease

## 2020-10-31 ENCOUNTER — Other Ambulatory Visit: Payer: Self-pay

## 2020-10-31 ENCOUNTER — Telehealth: Payer: Self-pay | Admitting: Pulmonary Disease

## 2020-10-31 NOTE — Telephone Encounter (Signed)
Spoke with the pt  He states that nobody from hospital has called him with pre procedure instructions for Bronch 11/01/20  We called Endo and spoke with person in charge today to let them know, and was advised that pt will be called today  Pt aware  Nothing further needed

## 2020-10-31 NOTE — Anesthesia Preprocedure Evaluation (Addendum)
Anesthesia Evaluation  Patient identified by MRN, date of birth, ID band Patient awake    Reviewed: Allergy & Precautions, NPO status , Patient's Chart, lab work & pertinent test results  Airway Mallampati: III  TM Distance: >3 FB Neck ROM: Full    Dental no notable dental hx.    Pulmonary asthma , former smoker,    Pulmonary exam normal breath sounds clear to auscultation       Cardiovascular hypertension, Pt. on medications + DVT  Normal cardiovascular exam Rhythm:Regular Rate:Normal  ECG: SR, rate 85   Neuro/Psych  Neuromuscular disease negative psych ROS   GI/Hepatic Neg liver ROS, GERD  Medicated and Controlled,  Endo/Other  Morbid obesity  Renal/GU negative Renal ROS     Musculoskeletal negative musculoskeletal ROS (+)   Abdominal (+) + obese,   Peds  Hematology hld   Anesthesia Other Findings BILATERAL LUNG MASS  Reproductive/Obstetrics                            Anesthesia Physical Anesthesia Plan  ASA: III  Anesthesia Plan: General   Post-op Pain Management:    Induction: Intravenous  PONV Risk Score and Plan: 2 and Ondansetron, Dexamethasone, Midazolam and Treatment may vary due to age or medical condition  Airway Management Planned: Oral ETT  Additional Equipment:   Intra-op Plan:   Post-operative Plan: Extubation in OR  Informed Consent: I have reviewed the patients History and Physical, chart, labs and discussed the procedure including the risks, benefits and alternatives for the proposed anesthesia with the patient or authorized representative who has indicated his/her understanding and acceptance.     Dental advisory given  Plan Discussed with: CRNA  Anesthesia Plan Comments:        Anesthesia Quick Evaluation

## 2020-10-31 NOTE — Progress Notes (Signed)
Mr. Zachary Weeks denies chest pain or shortness of breath at this time. Patient was tested for Covid and has been in quarantine since that time.  Mr. Zachary Weeks has shortness of breath at times and that is why he saw a Pulmonologist, patient's "mother died from Pulmonary Fibrosis and he wants to know early if he has it."

## 2020-11-01 ENCOUNTER — Ambulatory Visit (HOSPITAL_COMMUNITY): Payer: Managed Care, Other (non HMO) | Admitting: Physician Assistant

## 2020-11-01 ENCOUNTER — Encounter (HOSPITAL_COMMUNITY): Payer: Self-pay | Admitting: Pulmonary Disease

## 2020-11-01 ENCOUNTER — Encounter (HOSPITAL_COMMUNITY)
Admission: RE | Disposition: A | Discharge status: Home / Self Care | Payer: Self-pay | Source: Home / Self Care | Attending: Pulmonary Disease

## 2020-11-01 ENCOUNTER — Ambulatory Visit (HOSPITAL_COMMUNITY)
Admission: RE | Admit: 2020-11-01 | Discharge: 2020-11-01 | Disposition: A | Discharge status: Home / Self Care | Payer: Managed Care, Other (non HMO) | Attending: Pulmonary Disease | Admitting: Pulmonary Disease

## 2020-11-01 DIAGNOSIS — Z79899 Other long term (current) drug therapy: Secondary | ICD-10-CM | POA: Insufficient documentation

## 2020-11-01 DIAGNOSIS — Z6841 Body Mass Index (BMI) 40.0 and over, adult: Secondary | ICD-10-CM | POA: Diagnosis not present

## 2020-11-01 DIAGNOSIS — Z86718 Personal history of other venous thrombosis and embolism: Secondary | ICD-10-CM | POA: Insufficient documentation

## 2020-11-01 DIAGNOSIS — Z87891 Personal history of nicotine dependence: Secondary | ICD-10-CM | POA: Insufficient documentation

## 2020-11-01 DIAGNOSIS — R0602 Shortness of breath: Secondary | ICD-10-CM | POA: Diagnosis not present

## 2020-11-01 DIAGNOSIS — R059 Cough, unspecified: Secondary | ICD-10-CM | POA: Diagnosis not present

## 2020-11-01 DIAGNOSIS — R635 Abnormal weight gain: Secondary | ICD-10-CM | POA: Insufficient documentation

## 2020-11-01 DIAGNOSIS — R590 Localized enlarged lymph nodes: Secondary | ICD-10-CM | POA: Insufficient documentation

## 2020-11-01 DIAGNOSIS — K219 Gastro-esophageal reflux disease without esophagitis: Secondary | ICD-10-CM | POA: Insufficient documentation

## 2020-11-01 DIAGNOSIS — I10 Essential (primary) hypertension: Secondary | ICD-10-CM | POA: Insufficient documentation

## 2020-11-01 HISTORY — DX: Personal history of urinary calculi: Z87.442

## 2020-11-01 HISTORY — PX: VIDEO BRONCHOSCOPY: SHX5072

## 2020-11-01 HISTORY — DX: Dyspnea, unspecified: R06.00

## 2020-11-01 HISTORY — DX: Pneumonia, unspecified organism: J18.9

## 2020-11-01 HISTORY — PX: BRONCHIAL NEEDLE ASPIRATION BIOPSY: SHX5106

## 2020-11-01 HISTORY — DX: Gastro-esophageal reflux disease without esophagitis: K21.9

## 2020-11-01 HISTORY — PX: BRONCHIAL WASHINGS: SHX5105

## 2020-11-01 LAB — CBC
HCT: 43.7 % (ref 39.0–52.0)
Hemoglobin: 13.2 g/dL (ref 13.0–17.0)
MCH: 22.2 pg — ABNORMAL LOW (ref 26.0–34.0)
MCHC: 30.2 g/dL (ref 30.0–36.0)
MCV: 73.6 fL — ABNORMAL LOW (ref 80.0–100.0)
Platelets: 229 10*3/uL (ref 150–400)
RBC: 5.94 MIL/uL — ABNORMAL HIGH (ref 4.22–5.81)
RDW: 16.2 % — ABNORMAL HIGH (ref 11.5–15.5)
WBC: 6.2 10*3/uL (ref 4.0–10.5)
nRBC: 0 % (ref 0.0–0.2)

## 2020-11-01 LAB — BODY FLUID CELL COUNT WITH DIFFERENTIAL
Eos, Fluid: 1 %
Lymphs, Fluid: 3 %
Monocyte-Macrophage-Serous Fluid: 26 % — ABNORMAL LOW (ref 50–90)
Neutrophil Count, Fluid: 70 % — ABNORMAL HIGH (ref 0–25)
Total Nucleated Cell Count, Fluid: 30 cu mm (ref 0–1000)

## 2020-11-01 LAB — BASIC METABOLIC PANEL
Anion gap: 9 (ref 5–15)
BUN: 13 mg/dL (ref 6–20)
CO2: 28 mmol/L (ref 22–32)
Calcium: 8.8 mg/dL — ABNORMAL LOW (ref 8.9–10.3)
Chloride: 102 mmol/L (ref 98–111)
Creatinine, Ser: 1.4 mg/dL — ABNORMAL HIGH (ref 0.61–1.24)
GFR, Estimated: >60 mL/min (ref >60)
Glucose, Bld: 100 mg/dL — ABNORMAL HIGH (ref 70–99)
Potassium: 4.6 mmol/L (ref 3.5–5.1)
Sodium: 139 mmol/L (ref 135–145)

## 2020-11-01 SURGERY — VIDEO BRONCHOSCOPY WITHOUT FLUORO
Anesthesia: General

## 2020-11-01 MED ORDER — CHLORHEXIDINE GLUCONATE 0.12 % MT SOLN
OROMUCOSAL | Status: AC
  Administered 2020-11-01: 15 mL
  Filled 2020-11-01: qty 15

## 2020-11-01 MED ORDER — LIDOCAINE 2% (20 MG/ML) 5 ML SYRINGE
INTRAMUSCULAR | Status: DC | PRN
  Administered 2020-11-01: 60 mg via INTRAVENOUS

## 2020-11-01 MED ORDER — ROCURONIUM BROMIDE 10 MG/ML (PF) SYRINGE
PREFILLED_SYRINGE | INTRAVENOUS | Status: DC | PRN
  Administered 2020-11-01: 60 mg via INTRAVENOUS

## 2020-11-01 MED ORDER — OXYCODONE HCL 5 MG PO TABS: 5.0000 mg | ORAL_TABLET | Freq: Once | ORAL | Status: DC | PRN

## 2020-11-01 MED ORDER — PROMETHAZINE HCL 25 MG/ML IJ SOLN: 6.2500 mg | INTRAMUSCULAR | Status: DC | PRN

## 2020-11-01 MED ORDER — OXYCODONE HCL 5 MG/5ML PO SOLN: 5.0000 mg | Freq: Once | ORAL | Status: DC | PRN

## 2020-11-01 MED ORDER — FENTANYL CITRATE (PF) 100 MCG/2ML IJ SOLN
25.0000 ug | INTRAMUSCULAR | Status: DC | PRN
Start: 2020-11-01 — End: 2020-11-01

## 2020-11-01 MED ORDER — PROPOFOL 10 MG/ML IV BOLUS
INTRAVENOUS | Status: DC | PRN
  Administered 2020-11-01: 150 mg via INTRAVENOUS
  Administered 2020-11-01: 50 mg via INTRAVENOUS

## 2020-11-01 MED ORDER — ACETAMINOPHEN 10 MG/ML IV SOLN
1000.0000 mg | Freq: Once | INTRAVENOUS | Status: DC | PRN
Start: 2020-11-01 — End: 2020-11-01

## 2020-11-01 MED ORDER — FENTANYL CITRATE (PF) 100 MCG/2ML IJ SOLN
INTRAMUSCULAR | Status: DC | PRN
  Administered 2020-11-01: 100 ug via INTRAVENOUS

## 2020-11-01 MED ORDER — LACTATED RINGERS IV SOLN: INTRAVENOUS | Status: DC

## 2020-11-01 MED ORDER — ONDANSETRON HCL 4 MG/2ML IJ SOLN
INTRAMUSCULAR | Status: DC | PRN
  Administered 2020-11-01: 4 mg via INTRAVENOUS

## 2020-11-01 MED ORDER — SUGAMMADEX SODIUM 200 MG/2ML IV SOLN
INTRAVENOUS | Status: DC | PRN
  Administered 2020-11-01: 400 mg via INTRAVENOUS

## 2020-11-01 MED ORDER — MIDAZOLAM HCL 2 MG/2ML IJ SOLN
INTRAMUSCULAR | Status: DC | PRN
  Administered 2020-11-01: 2 mg via INTRAVENOUS

## 2020-11-01 MED ORDER — DEXAMETHASONE SODIUM PHOSPHATE 10 MG/ML IJ SOLN
INTRAMUSCULAR | Status: DC | PRN
  Administered 2020-11-01: 5 mg via INTRAVENOUS

## 2020-11-01 NOTE — Op Note (Signed)
  Name:  [name] MRN:  [id number] DOB:  February 13, 1981  PROCEDURE NOTE  Procedure(s): Flexible bronchoscopy [phone]) Bronchial alveolar lavage [phone]) of the RUL Endobronchial ultrasound ([id number]) Transbronchial needle aspiration (67672) of the subcarinal station 7 LN   Indications:  Hilar / mediastinal lymphadenopathy.  Consent:  Written informed consent was obtained prior to the procedure. The risks of the procedure including coughing, bleeding and the small chance of lung puncture requiring chest tube were discussed in great detail. The benefits & alternatives including serial follow up were also discussed.  Anesthesia:  General endotracheal.  Procedure summary:  Appropriate equipment was assembled.  The patient was  identified as Zachary Weeks. Interim history obtained and brought to the operating room. Safety timeout was performed. The patient was placed supine on the operating table, airway established and general anesthesia administered by Anesthesia team.   After the appropriate level of anesthesia was assured, flexible video bronchoscope was lubricated and inserted through the endotracheal tube.    Airway examination was performed bilaterally to subsegmental level.  Minimal clear secretions were noted, mucosa appeared normal and no endobronchial lesions were identified.  Endobronchial ultrasound video bronchoscope was then lubricated and inserted through the endotracheal tube. Surveillance of the mediastinal and and bilateral hilar lymph node stations was performed.  Pathologically enlarged lymph nodes were noted at station 7 & staion 4  Endobronchial ultrasound guided transbronchial needle aspiration of station7  (passes x 6-7), was performed, after which EBUS bronchoscope was withdrawn.  Flexible video bronchoscope was used again to perform rinspection.  After ensuring hemostasis , the bronchoscope was withdrawn.  The patient was extubated in operating room and transferred to  PACU.   Specimens sent: Bronchial alveolar lavage specimen of the RUL for  microbiology and cell count TBNA for cytology - prelim - lymphocytes  Complications:  No immediate complications were noted.  Hemodynamic parameters and oxygenation remained stable throughout the procedure.  Estimated blood loss:  Less then 5 mL.   Kara Mead MD. Shade Flood. [facility] Pager [phone] If no response call [phone]   11/01/2020 8:30 AM

## 2020-11-01 NOTE — Anesthesia Procedure Notes (Signed)
Procedure Name: Intubation Date/Time: 11/01/2020 7:45 AM Performed by: Dorthea Cove, CRNA Pre-anesthesia Checklist: Patient identified, Emergency Drugs available, Suction available and Patient being monitored Patient Re-evaluated:Patient Re-evaluated prior to induction Oxygen Delivery Method: Circle system utilized Preoxygenation: Pre-oxygenation with 100% oxygen Induction Type: IV induction Ventilation: Mask ventilation without difficulty and Two handed mask ventilation required Laryngoscope Size: Glidescope and 4 Grade View: Grade I Tube type: Oral Tube size: 8.5 mm Number of attempts: 1 Airway Equipment and Method: Stylet and Oral airway Placement Confirmation: ETT inserted through vocal cords under direct vision,  positive ETCO2 and breath sounds checked- equal and bilateral Secured at: 24 cm Tube secured with: Tape Dental Injury: Teeth and Oropharynx as per pre-operative assessment

## 2020-11-01 NOTE — Discharge Instructions (Signed)
Biopsy results will be called in to you in 1 week

## 2020-11-01 NOTE — Transfer of Care (Signed)
Immediate Anesthesia Transfer of Care Note  Patient: Zachary Weeks  Procedure(s) Performed: VIDEO BRONCHOSCOPY WITH ENDOBRONCHIAL ULTRASOUND (N/A ) BRONCHIAL NEEDLE ASPIRATION BIOPSIES BRONCHIAL WASHINGS  Patient Location: PACU  Anesthesia Type:General  Level of Consciousness: awake, alert  and oriented  Airway & Oxygen Therapy: Patient Spontanous Breathing and Patient connected to face mask oxygen  Post-op Assessment: Report given to RN and Post -op Vital signs reviewed and stable  Post vital signs: Reviewed and stable  Last Vitals:  Vitals Value Taken Time  BP 130/61 11/01/20 0851  Temp    Pulse 105 11/01/20 0852  Resp 18 11/01/20 0852  SpO2 95 % 11/01/20 0852  Vitals shown include unvalidated device data.  Last Pain:  Vitals:   11/01/20 0617  TempSrc: Oral  PainSc: 0-No pain         Complications: No complications documented.

## 2020-11-01 NOTE — Interval H&P Note (Signed)
History and Physical Interval Note:  11/01/2020 7:26 AM  Zachary Weeks  has presented today for surgery, with the diagnosis of BILATERAL LUNG MASS.  The various methods of treatment have been discussed with the patient and family. After consideration of risks, benefits and other options for treatment, the patient has consented to  Procedure(s): [place] (N/A) as a surgical intervention.  The patient's history has been reviewed, patient examined, no change in status, stable for surgery.  I have reviewed the patient's chart and labs.  Questions were answered to the patient's satisfaction.     Leanna Sato Elsworth Soho

## 2020-11-02 LAB — ACID FAST SMEAR (AFB, MYCOBACTERIA): Acid Fast Smear: NEGATIVE

## 2020-11-02 NOTE — Anesthesia Postprocedure Evaluation (Signed)
Anesthesia Post Note  Patient: Zachary Weeks  Procedure(s) Performed: VIDEO BRONCHOSCOPY WITH ENDOBRONCHIAL ULTRASOUND (N/A ) BRONCHIAL NEEDLE ASPIRATION BIOPSIES BRONCHIAL WASHINGS     Patient location during evaluation: PACU Anesthesia Type: General Level of consciousness: awake Pain management: pain level controlled Vital Signs Assessment: post-procedure vital signs reviewed and stable Respiratory status: spontaneous breathing, nonlabored ventilation, respiratory function stable and patient connected to nasal cannula oxygen Cardiovascular status: blood pressure returned to baseline and stable Postop Assessment: no apparent nausea or vomiting Anesthetic complications: no   No complications documented.  Last Vitals:  Vitals:   11/01/20 0951 11/01/20 0955  BP: 106/77   Pulse: 96 93  Resp: 18 15  Temp:  36.8 C  SpO2: 94% 95%    Last Pain:  Vitals:   11/01/20 0915  TempSrc:   PainSc: 0-No pain                 Eulala Newcombe P Trew Sunde

## 2020-11-03 ENCOUNTER — Encounter (HOSPITAL_COMMUNITY): Payer: Self-pay | Admitting: Pulmonary Disease

## 2020-11-04 LAB — CYTOLOGY - NON PAP

## 2020-11-06 ENCOUNTER — Telehealth: Payer: Self-pay | Admitting: Pulmonary Disease

## 2020-11-06 DIAGNOSIS — R0602 Shortness of breath: Secondary | ICD-10-CM

## 2020-11-06 NOTE — Telephone Encounter (Signed)
Called and spoke with pt to confirm that RA had called and spoken with him and he stated that he had. Stated to pt that he could go to [facility] for the cxr and he verbalized understanding. Order has been placed for the cxr. Nothing further needed.

## 2020-11-06 NOTE — Telephone Encounter (Signed)
He has mild shortness of breath, wonders if this is residual effects of anesthesia. He was seen to desaturate under anesthesia suggestive of OSA.  He is undergoing evaluation by [facility] but has not had a sleep study yet.  Please place order for him to get chest x-ray at [facility] so that I can review by tomorrow. He will check his O2 saturation with an oximeter and let us know if low He will call us back in 1 week if he is unable to schedule sleep study with [facility]

## 2020-11-06 NOTE — Progress Notes (Signed)
Per verbal from Dr Elsworth Soho, Dr Elsworth Soho spoke with patient on the phone today (11/06/2020) and went over this lab result. Nothing further needed at this time.

## 2020-11-06 NOTE — Telephone Encounter (Signed)
Primary Pulmonologist: Elsworth Soho Last office visit and with whom: 10/18/2020 What do we see them for (pulmonary problems): Bilateral hilar adenophathy syndrome and OSa Last OV assessment/plan: 10/18/2020 Assessment & Plan Note by Rigoberto Noel, MD at 10/18/2020 12:40 PM  Author: Rigoberto Noel, MD Author Type: Physician Filed: 10/18/2020 12:42 PM  Note Status: Written Cosign: Cosign Not Required Encounter Date: 10/18/2020  Problem: Mediastinal lymphadenopathy  Editor: Rigoberto Noel, MD (Physician)               Differential of mediastinal and hilar lymphadenopathy includes benign condition such as sarcoidosis, less likely subacute infection since he has always lived in [place], unlikely malignancy and this never smoker, no other lymphadenopathy to suggest lymphoma and low-grade hypermetabolism on PET scan. He has mild oxygen desaturation not to the point of requiring oxygen.  He does not have any parenchymal involvement on imaging.  The strategy here would be to proceed with bronchoscopy with EBUS to see if we can diagnose sarcoidosis.  Skin lesions do not seem to be significant enough to biopsy.  We will also schedule PFTs to get a sense of baseline lung function.  If biopsy shows granulomas we will proceed with treatment with steroids        Patient Instructions by Rigoberto Noel, MD at 10/18/2020 11:30 AM  Author: Rigoberto Noel, MD Author Type: Physician Filed: 10/18/2020 12:24 PM  Note Status: Addendum Mickle Mallory: Cosign Not Required Encounter Date: 10/18/2020  Editor: Rigoberto Noel, MD (Physician)      Prior Versions: 1. Rigoberto Noel, MD (Physician) at 10/18/2020 12:23 PM - Signed     Ambulatory satn Home sleep study has been scheduled Schedule PFTs Schedule bronchoscopy with biopsy of lymph nodes in [facility]  Renal consultation        Instructions     Return in about 2 months (around 12/18/2020).   Ambulatory satn Home sleep study has been  scheduled Schedule PFTs Schedule bronchoscopy with biopsy of lymph nodes in [facility]  Renal consultation        After Visit Summary (Printed 10/18/2020)     Reason for call:  Patient states he has been having increase sob since his bronch on 10/31/2020, now when he stands up he gets sob and he has some dizziness.  He thinks it is possibly related to the anesthesia he received.  He is unable to check his oxygen saturations at home.  He is having no other symptoms.  Dr. Elsworth Soho, please advise.  Thank you.  (examples of things to ask: : When did symptoms start? Fever? Cough? Productive? Color to sputum? More sputum than usual? Wheezing? Have you needed increased oxygen? Are you taking your respiratory medications? What over the counter measures have you tried?)  No Known Allergies  Immunization History  Administered Date(s) Administered  . Moderna Sars-Covid-2 Vaccination 06/03/2020, 07/01/2020  . Tdap 08/13/1995, 08/13/2020

## 2020-11-07 ENCOUNTER — Ambulatory Visit (HOSPITAL_COMMUNITY)
Admission: RE | Admit: 2020-11-07 | Discharge: 2020-11-07 | Disposition: A | Discharge status: Home / Self Care | Payer: Managed Care, Other (non HMO) | Source: Ambulatory Visit | Attending: Pulmonary Disease | Admitting: Pulmonary Disease

## 2020-11-07 ENCOUNTER — Other Ambulatory Visit: Payer: Self-pay

## 2020-11-07 DIAGNOSIS — R0602 Shortness of breath: Secondary | ICD-10-CM

## 2020-11-07 NOTE — Telephone Encounter (Signed)
Please let him know that chest x-ray appears clear.  No evidence of damage from the procedure

## 2020-12-04 LAB — FUNGUS CULTURE WITH STAIN

## 2020-12-04 LAB — FUNGUS CULTURE RESULT

## 2020-12-04 LAB — FUNGAL ORGANISM REFLEX

## 2020-12-13 ENCOUNTER — Other Ambulatory Visit: Payer: Self-pay

## 2020-12-13 ENCOUNTER — Ambulatory Visit: Payer: Managed Care, Other (non HMO) | Admitting: Pulmonary Disease

## 2020-12-13 ENCOUNTER — Encounter: Payer: Self-pay | Admitting: Pulmonary Disease

## 2020-12-13 DIAGNOSIS — R590 Localized enlarged lymph nodes: Secondary | ICD-10-CM | POA: Diagnosis not present

## 2020-12-13 DIAGNOSIS — G4733 Obstructive sleep apnea (adult) (pediatric): Secondary | ICD-10-CM

## 2020-12-13 NOTE — Patient Instructions (Signed)
°  Repeat CT chest W con in may 2022  Check CPAP report & sleep study  with [facility] We will take care of CPAP supplies as needed

## 2020-12-13 NOTE — Assessment & Plan Note (Signed)
He was diagnosed with severe OSA on CPAP is already helping him within 2 weeks.  He had a luna device severe unable to obtain a download today we will asked DME to obtain it for [place] and reviewed to make sure the settings are okay. He likes his full facemask and seems to have settled down well which is a good prognostic sign I would be curious to see if his pedal edema improves.  He is already on thiazide diuretic and we can continue that if hypoxia worsens or shortness of breath we can consider a trial of Lasix  Weight loss encouraged, compliance with goal of at least 4-6 hrs every night is the expectation. Advised against medications with sedative side effects Cautioned against driving when sleepy - understanding that sleepiness will vary on a day to day basis

## 2020-12-13 NOTE — Progress Notes (Signed)
   Subjective:    Patient ID: Zachary Weeks, male    DOB: [birth year], 40 y.o.   MRN: [id number]  HPI  40 year old obese Man  for FU of mediastinal and hilar lymphadenopathy & OSA   PMH - RLE DVT in 10/2019   Underwent hematology evaluation and detailed hypercoagulable work-up which was mostly negative except PCR-ABL positive .  bone marrow biopsy 02/2020 neg  Chief Complaint  Patient presents with  . Follow-up    No complaints currently    We discussed bronchoscopy results.  All of some shortness of breath, chest x-ray did not show any complications.  He underwent sleep study and was told he has severe OSA, he received Luna CPAP device from [facility].  Seems like he received an AirFit F 30 fullface mask which has really helped.  He is compliant he feels better has more energy feels like breathing is improved too Oxygen saturation 93% today he has hardly used albuterol inhaler  Significant tests/ events reviewed  HST 11/2020 [facility] severe OSA TBNA 10/2020 neg, scant lymphoid tissue  10/18/20 Ambulatory saturation-oxygen saturation was 92% on room air at rest and desaturated 91% on walking   10/2019 CT angio chest >> Mild mediastinal and bilateral hilar adenopathy, Prevascular lymph node has a short axis diameter of 14 mm. Subcarinal lymph node has a short axis diameter of 19 mm. Right paratracheal lymph node has a short axis diameter of 12 mm. No axillary adenopathy. 5 mm right lower lobe peripheral nodule  PET 04/2020 >> Mediastinal and bilateral hilar lymphadenopathy with associated increased metabolic activity, favored to represent benign, reactive changes.  CT chest W con 08/2020 Unchanged enlarged mediastinal and bilateral hilar lymph nodes,  which remain nonspecific  Review of Systems neg for any significant sore throat, dysphagia, itching, sneezing, nasal congestion or excess/ purulent secretions, fever, chills, sweats, unintended wt loss, pleuritic or exertional cp, hempoptysis,  orthopnea pnd or change in chronic leg swelling. Also denies presyncope, palpitations, heartburn, abdominal pain, nausea, vomiting, diarrhea or change in bowel or urinary habits, dysuria,hematuria, rash, arthralgias, visual complaints, headache, numbness weakness or ataxia.     Objective:   Physical Exam  Gen. Pleasant, obese, in no distress ENT - no lesions, no post nasal drip Neck: No JVD, no thyromegaly, no carotid bruits Lungs: no use of accessory muscles, no dullness to percussion, decreased without rales or rhonchi  Cardiovascular: Rhythm regular, heart sounds  normal, no murmurs or gallops, 1+ peripheral edema Musculoskeletal: No deformities, no cyanosis or clubbing , no tremors       Assessment & Plan:

## 2020-12-13 NOTE — Assessment & Plan Note (Signed)
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  No evidence of malignancy on TB NA or bone marrow biopsy. 68-monthfollow-up CT chest with contrast in April

## 2020-12-14 LAB — ACID FAST CULTURE WITH REFLEXED SENSITIVITIES (MYCOBACTERIA): Acid Fast Culture: NEGATIVE

## 2020-12-19 ENCOUNTER — Other Ambulatory Visit: Payer: Self-pay | Admitting: Nephrology

## 2020-12-19 ENCOUNTER — Other Ambulatory Visit (HOSPITAL_COMMUNITY): Payer: Self-pay | Admitting: Nephrology

## 2020-12-19 DIAGNOSIS — I129 Hypertensive chronic kidney disease with stage 1 through stage 4 chronic kidney disease, or unspecified chronic kidney disease: Secondary | ICD-10-CM

## 2020-12-19 DIAGNOSIS — N1831 Chronic kidney disease, stage 3a: Secondary | ICD-10-CM

## 2020-12-27 ENCOUNTER — Other Ambulatory Visit: Payer: Self-pay

## 2020-12-27 ENCOUNTER — Ambulatory Visit (HOSPITAL_COMMUNITY)
Admission: RE | Admit: 2020-12-27 | Discharge: 2020-12-27 | Disposition: A | Discharge status: Home / Self Care | Payer: Managed Care, Other (non HMO) | Source: Ambulatory Visit | Attending: Nephrology | Admitting: Nephrology

## 2020-12-27 DIAGNOSIS — I129 Hypertensive chronic kidney disease with stage 1 through stage 4 chronic kidney disease, or unspecified chronic kidney disease: Secondary | ICD-10-CM | POA: Insufficient documentation

## 2020-12-27 DIAGNOSIS — N1831 Chronic kidney disease, stage 3a: Secondary | ICD-10-CM | POA: Diagnosis not present

## 2021-01-20 ENCOUNTER — Encounter: Payer: Self-pay | Admitting: Internal Medicine

## 2021-01-22 NOTE — Progress Notes (Signed)
Subjective: 1. Renal stones   2. Urgency of urination      Consult requested by Ulice Bold MD  Zachary Weeks is a 40 yo male with CKD 3 who had a renal US as part of his evaluation and was found to have bilateral non-obstructing renal stones.   He had a PET CT for adenopathy on 04/24/20 and no stones were seen.   He has previously passed stones.  He has had no flank pain or hematuria.  His UA is clear today.   He has some increased frequency and urgency. He has no hesitancy or a reduced stream.  He has increased his water intake but doesn't drink coffee or tea.   He has normocalcemia.  ROS:  ROS  No Known Allergies  Past Medical History:  Diagnosis Date  . Asthma   . DVT (deep venous thrombosis) ([facility])   . Dyspnea    at times - no known reason.   Marland Kitchen GERD (gastroesophageal reflux disease)   . History of kidney stones    passed  . Hypertension   . 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  . Pneumonia    29- 62 years of age    Past Surgical History:  Procedure Laterality Date  . BRONCHIAL NEEDLE ASPIRATION BIOPSY  11/01/2020   Procedure: BRONCHIAL NEEDLE ASPIRATION BIOPSIES;  Surgeon: Rigoberto Noel, MD;  Location[facility] ENDOSCOPY;  Service: Cardiopulmonary;;  . BRONCHIAL WASHINGS  11/01/2020   Procedure: BRONCHIAL WASHINGS;  Surgeon: Rigoberto Noel, MD;  Location: [facility] ENDOSCOPY;  Service: Cardiopulmonary;;  . NO PAST SURGERIES    . VIDEO BRONCHOSCOPY N/A 11/01/2020   Procedure: VIDEO BRONCHOSCOPY WITH ENDOBRONCHIAL ULTRASOUND;  Surgeon: Rigoberto Noel, MD;  Location: [facility];  Service: Cardiopulmonary;  Laterality: N/A;    Social History   Socioeconomic History  . Marital status: Married    Spouse name: Not on file  . Number of children: 2  . Years of education: Not on file  . Highest education level: High school graduate  Occupational History  . Not on file  Tobacco Use  . Smoking status: Former Smoker    Packs/day: 1.00    Years: 3.00    Pack years: 3.00    Types: Cigarettes     Quit date: 1999    Years since quitting: 23.1  . Smokeless tobacco: Current User    Types: Chew, Snuff  Vaping Use  . Vaping Use: Never used  Substance and Sexual Activity  . Alcohol use: Yes    Comment: rarely- maybe 2 mixed drinks in a year  . Drug use: Never  . Sexual activity: Not on file  Other Topics Concern  . Not on file  Social History Narrative   Lives with family   Caffeine- occass soda   Social Determinants of Health   Financial Resource Strain: Not on file  Food Insecurity: Not on file  Transportation Needs: Not on file  Physical Activity: Not on file  Stress: Not on file  Social Connections: Not on file  Intimate Partner Violence: Not on file    Family History  Problem Relation Age of Onset  . Pulmonary fibrosis Mother   . Cancer Mother   . Cancer Father        prostate  . Hypertension Father   . Lupus Maternal Grandmother   . Other Maternal Grandmother        clotting disorder  . Fibromyalgia Maternal Aunt   . Other Other  clotting disorder    Anti-infectives: Anti-infectives (From admission, onward)   None      Current Outpatient Medications  Medication Sig Dispense Refill  . albuterol (VENTOLIN HFA) 108 (90 Base) MCG/ACT inhaler Inhale 1-2 puffs into the lungs every 6 (six) hours as needed for wheezing or shortness of breath.    Marland Kitchen atorvastatin (LIPITOR) 20 MG tablet Take 20 mg by mouth daily.    . cholecalciferol (VITAMIN D) 25 MCG (1000 UNIT) tablet Take 1,000 Units by mouth daily.    . Cholecalciferol 25 MCG (1000 UT) capsule Take by mouth.    . FEROSUL 325 (65 Fe) MG tablet Take 325 mg by mouth 4 (four) times daily.    . pantoprazole (PROTONIX) 40 MG tablet Take 40 mg by mouth daily.    . valsartan-hydrochlorothiazide (DIOVAN-HCT) 160-12.5 MG tablet Take 1 tablet by mouth daily.     No current facility-administered medications for this visit.     Objective: Vital signs in last 24 hours: BP 122/87   Pulse 93   Temp 98.4  F (36.9 C)   Ht 6' (1.829 m)   Wt (!) 335 lb (152 kg)   BMI 45.43 kg/m   Intake/Output from previous day: No intake/output data recorded. Intake/Output this shift: _0 @   Physical Exam Constitutional:      Appearance: Normal appearance. He is obese.  HENT:     Head: Normocephalic and atraumatic.  Cardiovascular:     Rate and Rhythm: Normal rate and regular rhythm.     Heart sounds: Normal heart sounds.  Pulmonary:     Effort: Pulmonary effort is normal.     Breath sounds: Normal breath sounds.  Abdominal:     Palpations: Abdomen is soft. There is no mass.     Tenderness: There is no abdominal tenderness.  Genitourinary:    Comments: Deferred.  Will examine at next visit. Musculoskeletal:        General: No swelling or tenderness. Normal range of motion.  Skin:    General: Skin is warm and dry.  Neurological:     General: No focal deficit present.     Mental Status: He is alert and oriented to person, place, and time.  Psychiatric:        Mood and Affect: Mood normal.        Behavior: Behavior normal.     Lab Results:  Recent Results (from the past 2160 hour(s))  SARS CORONAVIRUS 2 (TAT 6-24 HRS) Nasopharyngeal Nasopharyngeal Swab     Status: None   Collection Time: 10/29/20  9:52 AM   Specimen: Nasopharyngeal Swab  Result Value Ref Range   SARS Coronavirus 2 NEGATIVE NEGATIVE    Comment: (NOTE) SARS-CoV-2 target nucleic acids are NOT DETECTED.  The SARS-CoV-2 RNA is generally detectable in upper and lower respiratory specimens during the acute phase of infection. Negative results do not preclude SARS-CoV-2 infection, do not rule out co-infections with other pathogens, and should not be used as the sole basis for treatment or other patient management decisions. Negative results must be combined with clinical observations, patient history, and epidemiological information. The expected result is Negative.  Fact Sheet for  Patients: SugarRoll.be  Fact Sheet for Healthcare Providers: https://www.woods-mathews.com/  This test is not yet approved or cleared by the [place] FDA and  has been authorized for detection and/or diagnosis of SARS-CoV-2 by FDA under an Emergency Use Authorization (EUA). This EUA will remain  in effect (meaning this test can be used) for the  duration of the COVID-19 declaration under Se ction 564(b)(1) of the Act, 21 U.S.C. section 360bbb-3(b)(1), unless the authorization is terminated or revoked sooner.  Performed at [facility], [place] [address], [place], [place] [address]   Basic metabolic panel per protocol     Status: Abnormal   Collection Time: 11/01/20  6:50 AM  Result Value Ref Range   Sodium 139 135 - 145 mmol/L   Potassium 4.6 3.5 - 5.1 mmol/L   Chloride 102 98 - 111 mmol/L   CO2 28 22 - 32 mmol/L   Glucose, Bld 100 (H) 70 - 99 mg/dL    Comment: Glucose reference range applies only to samples taken after fasting for at least 8 hours.   BUN 13 6 - 20 mg/dL   Creatinine, Ser 1.40 (H) 0.61 - 1.24 mg/dL   Calcium 8.8 (L) 8.9 - 10.3 mg/dL   GFR, Estimated >60 >60 mL/min    Comment: (NOTE) Calculated using the CKD-EPI Creatinine Equation (2021)    Anion gap 9 5 - 15    Comment: Performed at [facility] [address] [place], [place] [address]  CBC per protocol     Status: Abnormal   Collection Time: 11/01/20  6:50 AM  Result Value Ref Range   WBC 6.2 4.0 - 10.5 K/uL   RBC 5.94 (H) 4.22 - 5.81 MIL/uL   Hemoglobin 13.2 13.0 - 17.0 g/dL   HCT 43.7 39.0 - 52.0 %   MCV 73.6 (L) 80.0 - 100.0 fL   MCH 22.2 (L) 26.0 - 34.0 pg   MCHC 30.2 30.0 - 36.0 g/dL   RDW 16.2 (H) 11.5 - 15.5 %   Platelets 229 150 - 400 K/uL   nRBC 0.0 0.0 - 0.2 %    Comment: Performed at [facility], [place] [address], [place], [place] [address]  Body fluid cell count with differential     Status: Abnormal   Collection Time:  11/01/20  7:54 AM  Result Value Ref Range   Fluid Type-FCT [place]    Color, Fluid COLORLESS (A) YELLOW   Appearance, Fluid HAZY (A) CLEAR   Total Nucleated Cell Count, Fluid 30 0 - 1,000 cu mm    Comment: COUNT MAY BE INACCURATE DUE TO FIBRIN CLUMPS.   Neutrophil Count, Fluid 70 (H) 0 - 25 %   Lymphs, Fluid 3 %   Monocyte-Macrophage-Serous Fluid 26 (L) 50 - 90 %   Eos, Fluid 1 %   Other Cells, Fluid MANY LINING CELLS. %    Comment: Performed at [facility], [place] [address] [place], [place] [address]  Fungus Culture With Stain     Status: None   Collection Time: 11/01/20  7:54 AM   Specimen: Bronchial Alveolar Lavage; Respiratory  Result Value Ref Range   Fungus Stain Final report    Fungus (Mycology) Culture Final report     Comment: (NOTE) Performed At: [facility] [place], [place] [id number] Rush Farmer MD [id number]    Fungal Source RUL BAL SPEC A     Comment: Performed at [facility], [place] [address] [place], [place] [address]  Acid Fast Culture with reflexed sensitivities     Status: None   Collection Time: 11/01/20  7:54 AM   Specimen: Bronchial Alveolar Lavage; Respiratory  Result Value Ref Range   Acid Fast Culture Negative     Comment: (NOTE) No acid fast bacilli isolated after 6 weeks. Performed  At: [facility] [place], [place] [id number] Rush Farmer MD [id number]    Source of Sample RUL BAL [facility] A     Comment: Performed at [facility], [place] [address] [place], [place] [address]  Acid Fast Smear (AFB)     Status: None   Collection Time: 11/01/20  7:54 AM   Specimen: Bronchial Alveolar Lavage; Respiratory  Result Value Ref Range   AFB Specimen Processing Concentration    Acid Fast Smear Negative     Comment: (NOTE) Performed At: [facility] [place], [place] [id number] Rush Farmer MD [id number]    Source (AFB) RUL BAL SPEC A     Comment:  Performed at [facility], [place] [address], [place], [place] [address]  Fungus Culture Result     Status: None   Collection Time: 11/01/20  7:54 AM  Result Value Ref Range   Result 1 Comment     Comment: (NOTE) KOH/Calcofluor preparation:  no fungus observed. Performed At: [facility] [place], [place] [id number] Rush Farmer MD [id number]   Fungal organism reflex     Status: None   Collection Time: 11/01/20  7:54 AM  Result Value Ref Range   Fungal result 1 Comment     Comment: (NOTE) No yeast or mold isolated after 4 weeks. Performed At: [facility] [place], [place] [id number] Rush Farmer MD [id number]   Cytology - Non PAP;     Status: None   Collection Time: 11/01/20  8:03 AM  Result Value Ref Range   CYTOLOGY - NON GYN      CYTOLOGY - NON PAP CASE: [id number] PATIENT: Francesca Jewett Non-Gynecological Cytology Report     Clinical History: Bilateral lung mass    FINAL MICROSCOPIC DIAGNOSIS:  A. LYMPH NODE, 7, FINE NEEDLE ASPIRATION: - No malignant cells identified - Scant lymphoid tissue.  SPECIMEN ADEQUACY:  A. Satisfactory for Evaluation  GROSS:  Received is/are (B)(1)2 Slides (1 Quick stain). (2)2 Slides (1 Quick stain). Also received are 15cc's of peach saline solution from needle rinses.(TS:GW:gw) Smears: (B)4 Concentration Method (Thin Prep): (B)1 Cell Block: (B)1 Conventional Additional Studies: N/A    Final Diagnosis performed by Vicente Males, MD.   Electronically signed 11/04/2020 Technical component performed at [employer]. [facility], [place] [address], [place], [place] [address].  Professional component performed at [facility], [place] [address], [place], [place] [address].  Immunohistochemistry Technical component (if applicable) was  performed at [facility]. [address] [place], [place], [place] [address].   IMMUNOHISTOCHEMISTRY  DISCLAIMER (if applicable): Some of these immunohistochemical stains may have been developed and the performance characteristics determine by [facility]. Some may not have been cleared or approved by the U.S. Food and Drug Administration. The FDA has determined that such clearance or approval is not necessary. This test is used for clinical purposes. It should not be regarded as investigational or for research. This laboratory is certified under the [place] (CLIA-88) as qualified to perform high complexity clinical laboratory testing.  The controls stained appropriately.   Urinalysis, Routine w reflex microscopic     Status: Abnormal   Collection Time: 01/23/21  9:14 AM  Result Value Ref Range   Specific Gravity, UA 1.025 1.005 - 1.030   pH, UA 5.5 5.0 - 7.5   Color, UA Amber (A) Yellow   Appearance Ur Clear Clear  Leukocytes,UA Negative Negative   Protein,UA Negative Negative/Trace   Glucose, UA Negative Negative   Ketones, UA Negative Negative   RBC, UA Negative Negative   Bilirubin, UA Negative Negative   Urobilinogen, Ur 0.2 0.2 - 1.0 mg/dL   Nitrite, UA Negative Negative   Microscopic Examination Comment     Comment: Microscopic follows if indicated.   UA is clear  Studies/Results: I have reviewed the [place] films and report and the PET/CT films and report.     Assessment/Plan: Renal stone.   The stones were seen on [place] but not on a CT in 6/21.  In order to confirm the presence of the stones, I will order a CT stone study.    Frequency and urgency.   I will get a PSA and have him return for a flowrate, PVR and prostate exam.   No orders of the defined types were placed in this encounter.    Orders Placed This Encounter  Procedures  . CT RENAL STONE STUDY    Standing Status:   Future    Standing Expiration Date:   02/23/2021    Order Specific Question:   Preferred imaging location?    Answer:   [facility]     Order Specific Question:   Radiology Contrast Protocol - do NOT remove file path    Answer:   [email or web address]  . Urinalysis, Routine w reflex microscopic  . PSA, total and free     Return for Next available with CT results for a flowrate, PVR and exam.    CC: Dr. Ulice Bold and Dr. Gar Ponto.      Irine Seal 01/23/2021 [phone]

## 2021-01-23 ENCOUNTER — Ambulatory Visit (INDEPENDENT_AMBULATORY_CARE_PROVIDER_SITE_OTHER): Payer: Managed Care, Other (non HMO) | Admitting: Urology

## 2021-01-23 ENCOUNTER — Other Ambulatory Visit: Payer: Self-pay

## 2021-01-23 ENCOUNTER — Encounter: Payer: Self-pay | Admitting: Urology

## 2021-01-23 VITALS — BP 122/87 | HR 93 | Temp 98.4°F | Ht 72.0 in | Wt 335.0 lb

## 2021-01-23 DIAGNOSIS — N200 Calculus of kidney: Secondary | ICD-10-CM | POA: Diagnosis not present

## 2021-01-23 DIAGNOSIS — R3915 Urgency of urination: Secondary | ICD-10-CM | POA: Diagnosis not present

## 2021-01-23 LAB — URINALYSIS, ROUTINE W REFLEX MICROSCOPIC
Bilirubin, UA: NEGATIVE
Glucose, UA: NEGATIVE
Ketones, UA: NEGATIVE
Leukocytes,UA: NEGATIVE
Nitrite, UA: NEGATIVE
Protein,UA: NEGATIVE
RBC, UA: NEGATIVE
Specific Gravity, UA: 1.025 (ref 1.005–1.030)
Urobilinogen, Ur: 0.2 mg/dL (ref 0.2–1.0)
pH, UA: 5.5 (ref 5.0–7.5)

## 2021-01-23 NOTE — Progress Notes (Signed)
Urological Symptom Review  Patient is experiencing the following symptoms: Frequent urination Hard to postpone urination Get up at night to urinate Leakage of urine  Kidney stones  Review of Systems  Gastrointestinal (upper)  : Indigestion/heartburn  Gastrointestinal (lower) : Negative for lower GI symptoms  Constitutional : Negative for symptoms  Skin: Negative for skin symptoms  Eyes: Negative for eye symptoms  Ear/Nose/Throat : Negative for Ear/Nose/Throat symptoms  Hematologic/Lymphatic: Negative for Hematologic/Lymphatic symptoms  Cardiovascular : Negative for cardiovascular symptoms  Respiratory : Negative for respiratory symptoms  Endocrine: Excessive thirst  Musculoskeletal: Negative for musculoskeletal symptoms  Neurological: Negative for neurological symptoms  Psychologic: Negative for psychiatric symptoms

## 2021-01-24 LAB — PSA, TOTAL AND FREE
PSA, Free Pct: 31 %
PSA, Free: 0.31 ng/mL
Prostate Specific Ag, Serum: 1 ng/mL (ref 0.0–4.0)

## 2021-01-27 ENCOUNTER — Telehealth: Payer: Self-pay

## 2021-01-27 NOTE — Telephone Encounter (Signed)
Message left with normal psa results on patients voicemail.

## 2021-01-27 NOTE — Telephone Encounter (Signed)
-----   Message from Irine Seal, MD sent at 01/27/2021  8:34 AM EST ----- PSA is normal

## 2021-02-19 ENCOUNTER — Ambulatory Visit: Payer: Managed Care, Other (non HMO) | Admitting: Gastroenterology

## 2021-02-19 ENCOUNTER — Encounter: Payer: Self-pay | Admitting: Internal Medicine

## 2021-02-21 ENCOUNTER — Ambulatory Visit (HOSPITAL_COMMUNITY): Payer: Managed Care, Other (non HMO)

## 2021-03-13 ENCOUNTER — Ambulatory Visit: Payer: Managed Care, Other (non HMO) | Admitting: Urology

## 2021-04-04 ENCOUNTER — Ambulatory Visit (HOSPITAL_COMMUNITY): Payer: Managed Care, Other (non HMO)

## 2021-04-09 ENCOUNTER — Other Ambulatory Visit: Payer: Self-pay

## 2021-04-09 DIAGNOSIS — N200 Calculus of kidney: Secondary | ICD-10-CM

## 2021-04-09 DIAGNOSIS — R109 Unspecified abdominal pain: Secondary | ICD-10-CM

## 2021-05-04 ENCOUNTER — Inpatient Hospital Stay (HOSPITAL_COMMUNITY)
Admission: EM | Admit: 2021-05-04 | Discharge: 2021-05-07 | DRG: 280 | Disposition: A | Discharge status: Home / Self Care | Payer: Managed Care, Other (non HMO) | Attending: Internal Medicine | Admitting: Internal Medicine

## 2021-05-04 ENCOUNTER — Encounter (HOSPITAL_COMMUNITY): Payer: Self-pay

## 2021-05-04 ENCOUNTER — Other Ambulatory Visit: Payer: Self-pay

## 2021-05-04 DIAGNOSIS — G4733 Obstructive sleep apnea (adult) (pediatric): Secondary | ICD-10-CM | POA: Diagnosis present

## 2021-05-04 DIAGNOSIS — Z832 Family history of diseases of the blood and blood-forming organs and certain disorders involving the immune mechanism: Secondary | ICD-10-CM

## 2021-05-04 DIAGNOSIS — Z79899 Other long term (current) drug therapy: Secondary | ICD-10-CM

## 2021-05-04 DIAGNOSIS — J45909 Unspecified asthma, uncomplicated: Secondary | ICD-10-CM | POA: Diagnosis present

## 2021-05-04 DIAGNOSIS — N183 Chronic kidney disease, stage 3 unspecified: Secondary | ICD-10-CM | POA: Diagnosis present

## 2021-05-04 DIAGNOSIS — Z86718 Personal history of other venous thrombosis and embolism: Secondary | ICD-10-CM

## 2021-05-04 DIAGNOSIS — N1831 Chronic kidney disease, stage 3a: Secondary | ICD-10-CM | POA: Diagnosis present

## 2021-05-04 DIAGNOSIS — Z87442 Personal history of urinary calculi: Secondary | ICD-10-CM

## 2021-05-04 DIAGNOSIS — R0902 Hypoxemia: Secondary | ICD-10-CM | POA: Diagnosis present

## 2021-05-04 DIAGNOSIS — K219 Gastro-esophageal reflux disease without esophagitis: Secondary | ICD-10-CM | POA: Diagnosis present

## 2021-05-04 DIAGNOSIS — Z8249 Family history of ischemic heart disease and other diseases of the circulatory system: Secondary | ICD-10-CM

## 2021-05-04 DIAGNOSIS — Z720 Tobacco use: Secondary | ICD-10-CM

## 2021-05-04 DIAGNOSIS — I214 Non-ST elevation (NSTEMI) myocardial infarction: Secondary | ICD-10-CM | POA: Diagnosis not present

## 2021-05-04 DIAGNOSIS — R0602 Shortness of breath: Secondary | ICD-10-CM

## 2021-05-04 DIAGNOSIS — Z809 Family history of malignant neoplasm, unspecified: Secondary | ICD-10-CM

## 2021-05-04 DIAGNOSIS — Z20822 Contact with and (suspected) exposure to covid-19: Secondary | ICD-10-CM | POA: Diagnosis present

## 2021-05-04 DIAGNOSIS — R590 Localized enlarged lymph nodes: Secondary | ICD-10-CM | POA: Diagnosis present

## 2021-05-04 DIAGNOSIS — I129 Hypertensive chronic kidney disease with stage 1 through stage 4 chronic kidney disease, or unspecified chronic kidney disease: Secondary | ICD-10-CM | POA: Diagnosis present

## 2021-05-04 DIAGNOSIS — R739 Hyperglycemia, unspecified: Secondary | ICD-10-CM | POA: Diagnosis present

## 2021-05-04 DIAGNOSIS — J9811 Atelectasis: Secondary | ICD-10-CM | POA: Diagnosis present

## 2021-05-04 DIAGNOSIS — J9601 Acute respiratory failure with hypoxia: Secondary | ICD-10-CM | POA: Diagnosis present

## 2021-05-04 DIAGNOSIS — I10 Essential (primary) hypertension: Secondary | ICD-10-CM | POA: Diagnosis present

## 2021-05-04 DIAGNOSIS — E785 Hyperlipidemia, unspecified: Secondary | ICD-10-CM | POA: Diagnosis present

## 2021-05-04 DIAGNOSIS — I2510 Atherosclerotic heart disease of native coronary artery without angina pectoris: Secondary | ICD-10-CM | POA: Diagnosis present

## 2021-05-04 DIAGNOSIS — R079 Chest pain, unspecified: Secondary | ICD-10-CM

## 2021-05-04 DIAGNOSIS — Z6841 Body Mass Index (BMI) 40.0 and over, adult: Secondary | ICD-10-CM

## 2021-05-04 DIAGNOSIS — D869 Sarcoidosis, unspecified: Secondary | ICD-10-CM | POA: Diagnosis present

## 2021-05-04 NOTE — ED Triage Notes (Signed)
Pt arrived POV c/o intermittent chest pain x 3 days with SOB. States home oxygen levels in the high 80's made him divide to come to ED

## 2021-05-05 ENCOUNTER — Emergency Department (HOSPITAL_COMMUNITY): Payer: Managed Care, Other (non HMO)

## 2021-05-05 ENCOUNTER — Ambulatory Visit (HOSPITAL_COMMUNITY)
Admission: EM | Disposition: A | Discharge status: Home / Self Care | Payer: Self-pay | Source: Home / Self Care | Attending: Internal Medicine

## 2021-05-05 DIAGNOSIS — E785 Hyperlipidemia, unspecified: Secondary | ICD-10-CM

## 2021-05-05 DIAGNOSIS — I129 Hypertensive chronic kidney disease with stage 1 through stage 4 chronic kidney disease, or unspecified chronic kidney disease: Secondary | ICD-10-CM | POA: Diagnosis not present

## 2021-05-05 DIAGNOSIS — G4733 Obstructive sleep apnea (adult) (pediatric): Secondary | ICD-10-CM | POA: Diagnosis not present

## 2021-05-05 DIAGNOSIS — R739 Hyperglycemia, unspecified: Secondary | ICD-10-CM

## 2021-05-05 DIAGNOSIS — J45909 Unspecified asthma, uncomplicated: Secondary | ICD-10-CM | POA: Diagnosis not present

## 2021-05-05 DIAGNOSIS — Z809 Family history of malignant neoplasm, unspecified: Secondary | ICD-10-CM | POA: Diagnosis not present

## 2021-05-05 DIAGNOSIS — I214 Non-ST elevation (NSTEMI) myocardial infarction: Secondary | ICD-10-CM | POA: Diagnosis present

## 2021-05-05 DIAGNOSIS — Z20822 Contact with and (suspected) exposure to covid-19: Secondary | ICD-10-CM | POA: Diagnosis not present

## 2021-05-05 DIAGNOSIS — K219 Gastro-esophageal reflux disease without esophagitis: Secondary | ICD-10-CM

## 2021-05-05 DIAGNOSIS — Z832 Family history of diseases of the blood and blood-forming organs and certain disorders involving the immune mechanism: Secondary | ICD-10-CM | POA: Diagnosis not present

## 2021-05-05 DIAGNOSIS — Z79899 Other long term (current) drug therapy: Secondary | ICD-10-CM | POA: Diagnosis not present

## 2021-05-05 DIAGNOSIS — R590 Localized enlarged lymph nodes: Secondary | ICD-10-CM | POA: Diagnosis not present

## 2021-05-05 DIAGNOSIS — Z87442 Personal history of urinary calculi: Secondary | ICD-10-CM | POA: Diagnosis not present

## 2021-05-05 DIAGNOSIS — J9601 Acute respiratory failure with hypoxia: Secondary | ICD-10-CM | POA: Diagnosis not present

## 2021-05-05 DIAGNOSIS — Z6841 Body Mass Index (BMI) 40.0 and over, adult: Secondary | ICD-10-CM | POA: Diagnosis not present

## 2021-05-05 DIAGNOSIS — Z720 Tobacco use: Secondary | ICD-10-CM | POA: Diagnosis not present

## 2021-05-05 DIAGNOSIS — I10 Essential (primary) hypertension: Secondary | ICD-10-CM | POA: Diagnosis not present

## 2021-05-05 DIAGNOSIS — Z86718 Personal history of other venous thrombosis and embolism: Secondary | ICD-10-CM | POA: Diagnosis not present

## 2021-05-05 DIAGNOSIS — N1831 Chronic kidney disease, stage 3a: Secondary | ICD-10-CM | POA: Diagnosis not present

## 2021-05-05 DIAGNOSIS — J9811 Atelectasis: Secondary | ICD-10-CM | POA: Diagnosis not present

## 2021-05-05 DIAGNOSIS — Z8249 Family history of ischemic heart disease and other diseases of the circulatory system: Secondary | ICD-10-CM | POA: Diagnosis not present

## 2021-05-05 DIAGNOSIS — I2510 Atherosclerotic heart disease of native coronary artery without angina pectoris: Secondary | ICD-10-CM | POA: Diagnosis not present

## 2021-05-05 HISTORY — PX: LEFT HEART CATH AND CORONARY ANGIOGRAPHY: CATH118249

## 2021-05-05 HISTORY — PX: CORONARY ULTRASOUND/IVUS: CATH118244

## 2021-05-05 LAB — BASIC METABOLIC PANEL
Anion gap: 9 (ref 5–15)
Anion gap: 9 (ref 5–15)
BUN: 16 mg/dL (ref 6–20)
BUN: 18 mg/dL (ref 6–20)
CO2: 24 mmol/L (ref 22–32)
CO2: 24 mmol/L (ref 22–32)
Calcium: 8.5 mg/dL — ABNORMAL LOW (ref 8.9–10.3)
Calcium: 8.9 mg/dL (ref 8.9–10.3)
Chloride: 100 mmol/L (ref 98–111)
Chloride: 101 mmol/L (ref 98–111)
Creatinine, Ser: 1.4 mg/dL — ABNORMAL HIGH (ref 0.61–1.24)
Creatinine, Ser: 1.68 mg/dL — ABNORMAL HIGH (ref 0.61–1.24)
GFR, Estimated: 52 mL/min — ABNORMAL LOW (ref >60)
GFR, Estimated: >60 mL/min (ref >60)
Glucose, Bld: 141 mg/dL — ABNORMAL HIGH (ref 70–99)
Glucose, Bld: 234 mg/dL — ABNORMAL HIGH (ref 70–99)
Potassium: 3.6 mmol/L (ref 3.5–5.1)
Potassium: 4.1 mmol/L (ref 3.5–5.1)
Sodium: 133 mmol/L — ABNORMAL LOW (ref 135–145)
Sodium: 134 mmol/L — ABNORMAL LOW (ref 135–145)

## 2021-05-05 LAB — HIV ANTIBODY (ROUTINE TESTING W REFLEX): HIV Screen 4th Generation wRfx: NONREACTIVE

## 2021-05-05 LAB — CBC
HCT: 50.7 % (ref 39.0–52.0)
Hemoglobin: 16.6 g/dL (ref 13.0–17.0)
MCH: 28 pg (ref 26.0–34.0)
MCHC: 32.7 g/dL (ref 30.0–36.0)
MCV: 85.6 fL (ref 80.0–100.0)
Platelets: 190 10*3/uL (ref 150–400)
RBC: 5.92 MIL/uL — ABNORMAL HIGH (ref 4.22–5.81)
RDW: 13.6 % (ref 11.5–15.5)
WBC: 9 10*3/uL (ref 4.0–10.5)
nRBC: 0 % (ref 0.0–0.2)

## 2021-05-05 LAB — TROPONIN I (HIGH SENSITIVITY)
Troponin I (High Sensitivity): 3829 ng/L (ref <18)
Troponin I (High Sensitivity): 4161 ng/L (ref <18)
Troponin I (High Sensitivity): 4403 ng/L (ref <18)
Troponin I (High Sensitivity): 4682 ng/L (ref <18)

## 2021-05-05 LAB — HEPARIN LEVEL (UNFRACTIONATED): Heparin Unfractionated: <0.1 IU/mL — ABNORMAL LOW (ref 0.30–0.70)

## 2021-05-05 LAB — LIPID PANEL
Cholesterol: 158 mg/dL (ref 0–200)
HDL: 32 mg/dL — ABNORMAL LOW (ref >40)
LDL Cholesterol: 81 mg/dL (ref 0–99)
Total CHOL/HDL Ratio: 4.9 RATIO
Triglycerides: 227 mg/dL — ABNORMAL HIGH (ref <150)
VLDL: 45 mg/dL — ABNORMAL HIGH (ref 0–40)

## 2021-05-05 LAB — TSH: TSH: 3.716 u[IU]/mL (ref 0.350–4.500)

## 2021-05-05 LAB — RESP PANEL BY RT-PCR (FLU A&B, COVID) ARPGX2
Influenza A by PCR: NEGATIVE
Influenza B by PCR: NEGATIVE
SARS Coronavirus 2 by RT PCR: NEGATIVE

## 2021-05-05 LAB — BRAIN NATRIURETIC PEPTIDE: B Natriuretic Peptide: 44 pg/mL (ref 0.0–100.0)

## 2021-05-05 LAB — POCT ACTIVATED CLOTTING TIME: Activated Clotting Time: 300 seconds

## 2021-05-05 SURGERY — LEFT HEART CATH AND CORONARY ANGIOGRAPHY
Anesthesia: LOCAL

## 2021-05-05 MED ORDER — ACETAMINOPHEN 325 MG PO TABS: 650.0000 mg | ORAL_TABLET | ORAL | Status: DC | PRN

## 2021-05-05 MED ORDER — FENTANYL CITRATE (PF) 100 MCG/2ML IJ SOLN
INTRAMUSCULAR | Status: AC
  Filled 2021-05-05: qty 2

## 2021-05-05 MED ORDER — SODIUM CHLORIDE 0.9 % IV SOLN: INTRAVENOUS | Status: DC

## 2021-05-05 MED ORDER — VERAPAMIL HCL 2.5 MG/ML IV SOLN
INTRAVENOUS | Status: DC | PRN
  Administered 2021-05-05: 10 mL via INTRA_ARTERIAL

## 2021-05-05 MED ORDER — LIDOCAINE HCL (PF) 1 % IJ SOLN
INTRAMUSCULAR | Status: AC
  Filled 2021-05-05: qty 30

## 2021-05-05 MED ORDER — METOPROLOL TARTRATE 25 MG PO TABS
25.0000 mg | ORAL_TABLET | Freq: Two times a day (BID) | ORAL | Status: DC
  Administered 2021-05-05 – 2021-05-07 (×5): 25 mg via ORAL
  Filled 2021-05-05 (×5): qty 1

## 2021-05-05 MED ORDER — FUROSEMIDE 10 MG/ML IJ SOLN
40.0000 mg | Freq: Once | INTRAMUSCULAR | Status: AC
  Administered 2021-05-05: 40 mg via INTRAVENOUS
  Filled 2021-05-05: qty 4

## 2021-05-05 MED ORDER — FENTANYL CITRATE (PF) 100 MCG/2ML IJ SOLN
INTRAMUSCULAR | Status: DC | PRN
  Administered 2021-05-05: 50 ug via INTRAVENOUS

## 2021-05-05 MED ORDER — HEPARIN BOLUS VIA INFUSION
3000.0000 [IU] | Freq: Once | INTRAVENOUS | Status: AC
  Administered 2021-05-05: 3000 [IU] via INTRAVENOUS

## 2021-05-05 MED ORDER — TICAGRELOR 90 MG PO TABS
90.0000 mg | ORAL_TABLET | Freq: Two times a day (BID) | ORAL | Status: DC
  Administered 2021-05-06 – 2021-05-07 (×3): 90 mg via ORAL
  Filled 2021-05-05 (×3): qty 1

## 2021-05-05 MED ORDER — HEPARIN (PORCINE) 25000 UT/250ML-% IV SOLN
2000.0000 [IU]/h | INTRAVENOUS | Status: AC
  Administered 2021-05-06: 2000 [IU]/h via INTRAVENOUS
  Administered 2021-05-06: 1750 [IU]/h via INTRAVENOUS
  Filled 2021-05-05 (×2): qty 250

## 2021-05-05 MED ORDER — ATORVASTATIN CALCIUM 40 MG PO TABS: 40.0000 mg | ORAL_TABLET | Freq: Every day | ORAL | Status: DC

## 2021-05-05 MED ORDER — ASPIRIN 81 MG PO CHEW
324.0000 mg | CHEWABLE_TABLET | Freq: Once | ORAL | Status: AC
  Administered 2021-05-05: 324 mg via ORAL
  Filled 2021-05-05: qty 4

## 2021-05-05 MED ORDER — TICAGRELOR 90 MG PO TABS
ORAL_TABLET | ORAL | Status: DC | PRN
  Administered 2021-05-05: 180 mg via ORAL

## 2021-05-05 MED ORDER — TICAGRELOR 90 MG PO TABS
ORAL_TABLET | ORAL | Status: AC
  Filled 2021-05-05: qty 2

## 2021-05-05 MED ORDER — SODIUM CHLORIDE 0.9 % IV SOLN: INTRAVENOUS | Status: AC

## 2021-05-05 MED ORDER — HEPARIN BOLUS VIA INFUSION
4000.0000 [IU] | Freq: Once | INTRAVENOUS | Status: AC
  Administered 2021-05-05: 4000 [IU] via INTRAVENOUS

## 2021-05-05 MED ORDER — HEPARIN (PORCINE) IN NACL 1000-0.9 UT/500ML-% IV SOLN
INTRAVENOUS | Status: AC
  Filled 2021-05-05: qty 1000

## 2021-05-05 MED ORDER — VERAPAMIL HCL 2.5 MG/ML IV SOLN
INTRAVENOUS | Status: AC
  Filled 2021-05-05: qty 2

## 2021-05-05 MED ORDER — ONDANSETRON HCL 4 MG/2ML IJ SOLN: 4.0000 mg | Freq: Four times a day (QID) | INTRAMUSCULAR | Status: DC | PRN

## 2021-05-05 MED ORDER — HEPARIN (PORCINE) 25000 UT/250ML-% IV SOLN
1750.0000 [IU]/h | INTRAVENOUS | Status: DC
  Administered 2021-05-05: 1400 [IU]/h via INTRAVENOUS
  Filled 2021-05-05 (×2): qty 250

## 2021-05-05 MED ORDER — MIDAZOLAM HCL 2 MG/2ML IJ SOLN
INTRAMUSCULAR | Status: AC
  Filled 2021-05-05: qty 2

## 2021-05-05 MED ORDER — SODIUM CHLORIDE 0.9% FLUSH: 3.0000 mL | INTRAVENOUS | Status: DC | PRN

## 2021-05-05 MED ORDER — HEPARIN SODIUM (PORCINE) 1000 UNIT/ML IJ SOLN
INTRAMUSCULAR | Status: AC
  Filled 2021-05-05: qty 1

## 2021-05-05 MED ORDER — NITROGLYCERIN 0.4 MG SL SUBL: 0.4000 mg | SUBLINGUAL_TABLET | SUBLINGUAL | Status: DC | PRN

## 2021-05-05 MED ORDER — ALBUTEROL SULFATE (2.5 MG/3ML) 0.083% IN NEBU: 3.0000 mL | INHALATION_SOLUTION | Freq: Four times a day (QID) | RESPIRATORY_TRACT | Status: DC | PRN

## 2021-05-05 MED ORDER — SODIUM CHLORIDE 0.9% FLUSH
3.0000 mL | Freq: Two times a day (BID) | INTRAVENOUS | Status: DC
  Administered 2021-05-05 – 2021-05-07 (×3): 3 mL via INTRAVENOUS

## 2021-05-05 MED ORDER — SODIUM CHLORIDE 0.9 % IV SOLN: 250.0000 mL | INTRAVENOUS | Status: DC | PRN

## 2021-05-05 MED ORDER — ACETAMINOPHEN 500 MG PO TABS: 1000.0000 mg | ORAL_TABLET | Freq: Once | ORAL | Status: DC

## 2021-05-05 MED ORDER — LABETALOL HCL 5 MG/ML IV SOLN: 10.0000 mg | INTRAVENOUS | Status: AC | PRN

## 2021-05-05 MED ORDER — ASPIRIN EC 81 MG PO TBEC
81.0000 mg | DELAYED_RELEASE_TABLET | Freq: Every day | ORAL | Status: DC
  Administered 2021-05-05 – 2021-05-07 (×3): 81 mg via ORAL
  Filled 2021-05-05 (×3): qty 1

## 2021-05-05 MED ORDER — ATORVASTATIN CALCIUM 40 MG PO TABS
40.0000 mg | ORAL_TABLET | Freq: Every day | ORAL | Status: DC
  Administered 2021-05-05 – 2021-05-07 (×3): 40 mg via ORAL
  Filled 2021-05-05 (×3): qty 1

## 2021-05-05 MED ORDER — PANTOPRAZOLE SODIUM 40 MG PO TBEC: 40.0000 mg | DELAYED_RELEASE_TABLET | Freq: Every day | ORAL | Status: DC

## 2021-05-05 MED ORDER — IOHEXOL 350 MG/ML SOLN
75.0000 mL | Freq: Once | INTRAVENOUS | Status: AC | PRN
  Administered 2021-05-05: 75 mL via INTRAVENOUS

## 2021-05-05 MED ORDER — HYDRALAZINE HCL 20 MG/ML IJ SOLN: 10.0000 mg | INTRAMUSCULAR | Status: AC | PRN

## 2021-05-05 MED ORDER — NITROGLYCERIN 1 MG/10 ML FOR IR/CATH LAB
INTRA_ARTERIAL | Status: AC
  Filled 2021-05-05: qty 10

## 2021-05-05 MED ORDER — LABETALOL HCL 5 MG/ML IV SOLN: 10.0000 mg | Freq: Once | INTRAVENOUS | Status: DC

## 2021-05-05 MED ORDER — ALPRAZOLAM 0.25 MG PO TABS: 0.2500 mg | ORAL_TABLET | Freq: Two times a day (BID) | ORAL | Status: DC | PRN

## 2021-05-05 MED ORDER — HEPARIN (PORCINE) IN NACL 1000-0.9 UT/500ML-% IV SOLN
INTRAVENOUS | Status: DC | PRN
  Administered 2021-05-05 (×2): 500 mL

## 2021-05-05 MED ORDER — IOHEXOL 350 MG/ML SOLN
INTRAVENOUS | Status: DC | PRN
  Administered 2021-05-05: 85 mL

## 2021-05-05 MED ORDER — LIDOCAINE HCL (PF) 1 % IJ SOLN
INTRAMUSCULAR | Status: DC | PRN
  Administered 2021-05-05: 2 mL

## 2021-05-05 MED ORDER — PANTOPRAZOLE SODIUM 40 MG PO TBEC
40.0000 mg | DELAYED_RELEASE_TABLET | Freq: Every day | ORAL | Status: DC
  Administered 2021-05-05 – 2021-05-07 (×3): 40 mg via ORAL
  Filled 2021-05-05 (×3): qty 1

## 2021-05-05 MED ORDER — HEPARIN SODIUM (PORCINE) 1000 UNIT/ML IJ SOLN
INTRAMUSCULAR | Status: DC | PRN
  Administered 2021-05-05 (×2): 6000 [IU] via INTRAVENOUS

## 2021-05-05 MED ORDER — MIDAZOLAM HCL 2 MG/2ML IJ SOLN
INTRAMUSCULAR | Status: DC | PRN
  Administered 2021-05-05: 1 mg via INTRAVENOUS

## 2021-05-05 SURGICAL SUPPLY — 16 items
CATH 5FR JL3.5 JR4 ANG PIG MP (CATHETERS) ×1 IMPLANT
CATH INFINITI 5FR JL4 (CATHETERS) ×1 IMPLANT
CATH LAUNCHER 6FR EBU 3 (CATHETERS) ×1 IMPLANT
CATH OPTICROSS HD (CATHETERS) ×1 IMPLANT
DEVICE RAD COMP TR BAND LRG (VASCULAR PRODUCTS) ×1 IMPLANT
GLIDESHEATH SLEND A-KIT 6F 22G (SHEATH) ×1 IMPLANT
GUIDEWIRE INQWIRE 1.5J.035X260 (WIRE) IMPLANT
INQWIRE 1.5J .035X260CM (WIRE) ×4
KIT ESSENTIALS PG (KITS) ×2 IMPLANT
KIT HEART LEFT (KITS) ×2 IMPLANT
KIT HEMO VALVE WATCHDOG (MISCELLANEOUS) ×1 IMPLANT
PACK CARDIAC CATHETERIZATION (CUSTOM PROCEDURE TRAY) ×2 IMPLANT
SLED PULL BACK IVUS (MISCELLANEOUS) ×1 IMPLANT
TRANSDUCER W/STOPCOCK (MISCELLANEOUS) ×2 IMPLANT
TUBING CIL FLEX 10 FLL-RA (TUBING) ×2 IMPLANT
WIRE COUGAR XT STRL 190CM (WIRE) ×1 IMPLANT

## 2021-05-05 NOTE — H&P (Signed)
Zachary Weeks is an 40 y.o. male.   Chief Complaint: Chest pain HPI:   40 y.o. Caucasian male  with hypertension, asthma, OSA on CPAP, stable nonspecific hilar and mediastinal adenopathy followed by pulmonology, h/o DVT, now admitted with chest pain.  Patient presented to [facility] emergency room on 05/04/2021 with chest pain and shortness of breath for 3 days.  He also noticed his  oxygen saturation to be in high 80s.  Work-up in [facility] emergency room showed EKG with sinus tachycardia, without any acute ischemic changes, HS troponin peaked at 4600, chest x-ray showed no acute cardiopulmonary abnormality, CTA showed stable diffuse nonspecific mediastinal and hilar adenopathy, dependent atelectasis and minimal scarring.  Initial creatinine was 1.68, improved to 1.40 after IV fluids.  Patient was transferred to [facility] for further management of NSTEMI.  While in [facility], ER, his oxygen saturation has largely remained above 92%.    Past Medical History:  Diagnosis Date   Asthma    DVT (deep venous thrombosis) (HCC)    Dyspnea    at times - no known reason.    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   Pneumonia    75- 47 years of age    Past Surgical History:  Procedure Laterality Date   BRONCHIAL NEEDLE ASPIRATION BIOPSY  11/01/2020   Procedure: BRONCHIAL NEEDLE ASPIRATION BIOPSIES;  Surgeon: Rigoberto Noel, MD;  Location: [facility] ENDOSCOPY;  Service: Cardiopulmonary;;   BRONCHIAL WASHINGS  11/01/2020   Procedure: BRONCHIAL WASHINGS;  Surgeon: Rigoberto Noel, MD;  Location: [place] ENDOSCOPY;  Service: Cardiopulmonary;;   NO PAST SURGERIES     VIDEO BRONCHOSCOPY N/A 11/01/2020   Procedure: VIDEO BRONCHOSCOPY WITH ENDOBRONCHIAL ULTRASOUND;  Surgeon: Rigoberto Noel, MD;  Location: [place];  Service: Cardiopulmonary;  Laterality: N/A;     Family History  Problem Relation Age of  Onset   Pulmonary fibrosis Mother    Cancer Mother    Cancer Father        prostate   Hypertension Father    Lupus Maternal Grandmother    Other Maternal Grandmother        clotting disorder   Fibromyalgia Maternal Aunt    Other Other        clotting disorder    Social History:  reports that he quit smoking about 23 years ago. His smoking use included cigarettes. He has a 3.00 pack-year smoking history. His smokeless tobacco use includes chew and snuff. He reports current alcohol use. He reports that he does not use drugs.  Allergies: No Known Allergies  Review of Systems  Constitutional: Negative for decreased appetite, malaise/fatigue, weight gain and weight loss.  HENT:  Negative for congestion.   Eyes:  Negative for visual disturbance.  Cardiovascular:  Positive for chest pain and dyspnea on exertion. Negative for leg swelling, palpitations and syncope.  Respiratory:  Positive for shortness of breath. Negative for cough.   Endocrine: Negative for cold intolerance.  Hematologic/Lymphatic: Does not bruise/bleed easily.  Skin:  Negative for itching and rash.  Musculoskeletal:  Negative for myalgias.  Gastrointestinal:  Negative for abdominal pain, nausea and vomiting.  Genitourinary:  Negative for dysuria.  Neurological:  Negative for dizziness and weakness.  Psychiatric/Behavioral:  The patient is not nervous/anxious.   All other systems reviewed and are negative.   Blood pressure 117/87, pulse 64, temperature 98.4 F (36.9 C),  temperature source Oral, resp. rate 17, height 6' (1.829 m), weight (!) 145.2 kg, SpO2 95 %. Body mass index is 43.4 kg/m.  Physical Exam Vitals and nursing note reviewed.  Constitutional:      General: He is not in acute distress.    Appearance: He is well-developed.  HENT:     Head: Normocephalic and atraumatic.  Eyes:     Conjunctiva/sclera: Conjunctivae normal.     Pupils: Pupils are equal, round, and reactive to light.  Neck:      Vascular: No JVD.  Cardiovascular:     Rate and Rhythm: Normal rate and regular rhythm.     Pulses: Normal pulses and intact distal pulses.     Heart sounds: No murmur heard. Pulmonary:     Effort: Pulmonary effort is normal.     Breath sounds: Normal breath sounds. No wheezing or rales.  Abdominal:     General: Bowel sounds are normal.     Palpations: Abdomen is soft.     Tenderness: There is no rebound.  Musculoskeletal:        General: No tenderness. Normal range of motion.     Right lower leg: No edema.     Left lower leg: No edema.  Lymphadenopathy:     Cervical: No cervical adenopathy.  Skin:    General: Skin is warm and dry.  Neurological:     Mental Status: He is alert and oriented to person, place, and time.     Cranial Nerves: No cranial nerve deficit.    Results for orders placed or performed during the hospital encounter of 05/04/21 (from the past 48 hour(s))  Basic metabolic panel     Status: Abnormal   Collection Time: 05/05/21 12:11 AM  Result Value Ref Range   Sodium 133 (L) 135 - 145 mmol/L   Potassium 3.6 3.5 - 5.1 mmol/L   Chloride 100 98 - 111 mmol/L   CO2 24 22 - 32 mmol/L   Glucose, Bld 234 (H) 70 - 99 mg/dL    Comment: Glucose reference range applies only to samples taken after fasting for at least 8 hours.   BUN 18 6 - 20 mg/dL   Creatinine, Ser 1.68 (H) 0.61 - 1.24 mg/dL   Calcium 8.9 8.9 - 10.3 mg/dL   GFR, Estimated 52 (L) >60 mL/min    Comment: (NOTE) Calculated using the CKD-EPI Creatinine Equation (2021)    Anion gap 9 5 - 15    Comment: Performed at [facility], [address], [place], [place] [address]  CBC     Status: Abnormal   Collection Time: 05/05/21 12:11 AM  Result Value Ref Range   WBC 9.0 4.0 - 10.5 K/uL   RBC 5.92 (H) 4.22 - 5.81 MIL/uL   Hemoglobin 16.6 13.0 - 17.0 g/dL   HCT 50.7 39.0 - 52.0 %   MCV 85.6 80.0 - 100.0 fL   MCH 28.0 26.0 - 34.0 pg   MCHC 32.7 30.0 - 36.0 g/dL   RDW 13.6 11.5 - 15.5 %   Platelets 190 150  - 400 K/uL   nRBC 0.0 0.0 - 0.2 %    Comment: Performed at [facility], [address], [place], [place] [address]  Troponin I (High Sensitivity)     Status: Abnormal   Collection Time: 05/05/21 12:11 AM  Result Value Ref Range   Troponin I (High Sensitivity) 4,161 (HH) <18 ng/L    Comment: CRITICAL RESULT CALLED TO, READ BACK BY AND VERIFIED WITH:  NICKOLS,K @ 9767 ON 05/05/21 BY JUW (NOTE) Elevated high sensitivity troponin I (hsTnI) values and significant  changes across serial measurements may suggest ACS but many other  chronic and acute conditions are known to elevate hsTnI results.  Refer to the Links section for chest pain algorithms and additional  guidance. Performed at [facility], [address] [place], [place] [address]   Brain natriuretic peptide     Status: None   Collection Time: 05/05/21 12:11 AM  Result Value Ref Range   B Natriuretic Peptide 44.0 0.0 - 100.0 pg/mL    Comment: Performed at [facility], [address], [place], [place] [address]  TSH     Status: None   Collection Time: 05/05/21 12:11 AM  Result Value Ref Range   TSH 3.716 0.350 - 4.500 uIU/mL    Comment: Performed by a 3rd Generation assay with a functional sensitivity of <=0.01 uIU/mL. Performed at [facility], [address] [place], [place] [address]   Troponin I (High Sensitivity)     Status: Abnormal   Collection Time: 05/05/21  2:04 AM  Result Value Ref Range   Troponin I (High Sensitivity) 4,682 (HH) <18 ng/L    Comment: CRITICAL RESULT CALLED TO, READ BACK BY AND VERIFIED WITH: WALKER,T @ 0305 ON 05/05/21 BY JUW (NOTE) Elevated high sensitivity troponin I (hsTnI) values and significant  changes across serial measurements may suggest ACS but many other  chronic and acute conditions are known to elevate hsTnI results.  Refer to the Links section for chest pain algorithms and additional  guidance. Performed at [facility], [address], [place], [place] [address]   Troponin I (High  Sensitivity)     Status: Abnormal   Collection Time: 05/05/21  7:27 AM  Result Value Ref Range   Troponin I (High Sensitivity) 4,403 (HH) <18 ng/L    Comment: CRITICAL VALUE NOTED.  VALUE IS CONSISTENT WITH PREVIOUSLY REPORTED AND CALLED VALUE. (NOTE) Elevated high sensitivity troponin I (hsTnI) values and significant  changes across serial measurements may suggest ACS but many other  chronic and acute conditions are known to elevate hsTnI results.  Refer to the Links section for chest pain algorithms and additional  guidance. Performed at [facility], [address] [place], [place] [address]   Lipid panel     Status: Abnormal   Collection Time: 05/05/21  7:27 AM  Result Value Ref Range   Cholesterol 158 0 - 200 mg/dL   Triglycerides 227 (H) <150 mg/dL   HDL 32 (L) >40 mg/dL   Total CHOL/HDL Ratio 4.9 RATIO   VLDL 45 (H) 0 - 40 mg/dL   LDL Cholesterol 81 0 - 99 mg/dL    Comment:        Total Cholesterol/HDL:CHD Risk Coronary Heart Disease Risk Table                     Men   Women  1/2 Average Risk   3.4   3.3  Average Risk       5.0   4.4  2 X Average Risk   9.6   7.1  3 X Average Risk  23.4   11.0        Use the calculated Patient Ratio above and the CHD Risk Table to determine the patient's CHD Risk.        ATP III CLASSIFICATION (LDL):  <100     mg/dL   Optimal  100-129  mg/dL   Near  or Above                    Optimal  130-159  mg/dL   Borderline  160-189  mg/dL   High  >190     mg/dL   Very High Performed at [facility], [address], [place], [place] [address]   Heparin level (unfractionated)     Status: Abnormal   Collection Time: 05/05/21  9:15 AM  Result Value Ref Range   Heparin Unfractionated <0.10 (L) 0.30 - 0.70 IU/mL    Comment: (NOTE) The clinical reportable range upper limit is being lowered to >1.10 to align with the FDA approved guidance for the current laboratory assay.  If heparin results are below expected values, and patient dosage has   been confirmed, suggest follow up testing of antithrombin III levels. Performed at [facility], [address] [place], [place] [address]   Troponin I (High Sensitivity)     Status: Abnormal   Collection Time: 05/05/21  9:15 AM  Result Value Ref Range   Troponin I (High Sensitivity) 3,829 (HH) <18 ng/L    Comment: CRITICAL VALUE NOTED.  VALUE IS CONSISTENT WITH PREVIOUSLY REPORTED AND CALLED VALUE. (NOTE) Elevated high sensitivity troponin I (hsTnI) values and significant  changes across serial measurements may suggest ACS but many other  chronic and acute conditions are known to elevate hsTnI results.  Refer to the Links section for chest pain algorithms and additional  guidance. Performed at [facility], [address] [place], [place] [address]   Basic metabolic panel     Status: Abnormal   Collection Time: 05/05/21  9:15 AM  Result Value Ref Range   Sodium 134 (L) 135 - 145 mmol/L   Potassium 4.1 3.5 - 5.1 mmol/L   Chloride 101 98 - 111 mmol/L   CO2 24 22 - 32 mmol/L   Glucose, Bld 141 (H) 70 - 99 mg/dL    Comment: Glucose reference range applies only to samples taken after fasting for at least 8 hours.   BUN 16 6 - 20 mg/dL   Creatinine, Ser 1.40 (H) 0.61 - 1.24 mg/dL   Calcium 8.5 (L) 8.9 - 10.3 mg/dL   GFR, Estimated >60 >60 mL/min    Comment: (NOTE) Calculated using the CKD-EPI Creatinine Equation (2021)    Anion gap 9 5 - 15    Comment: Performed at [facility], [address], [place], [place] [address]    Labs:   Lab Results  Component Value Date   WBC 9.0 05/05/2021   HGB 16.6 05/05/2021   HCT 50.7 05/05/2021   MCV 85.6 05/05/2021   PLT 190 05/05/2021    Recent Labs  Lab 05/05/21 0915  NA 134*  K 4.1  CL 101  CO2 24  BUN 16  CREATININE 1.40*  CALCIUM 8.5*  GLUCOSE 141*    Lipid Panel     Component Value Date/Time   CHOL 158 05/05/2021 0727   TRIG 227 (H) 05/05/2021 0727   HDL 32 (L) 05/05/2021 0727   CHOLHDL 4.9 05/05/2021 0727   VLDL  45 (H) 05/05/2021 0727   LDLCALC 81 05/05/2021 0727    BNP (last 3 results) Recent Labs    05/05/21 0011  BNP 44.0    HEMOGLOBIN A1C No results found for: HGBA1C, MPG  Cardiac Panel (last 3 results) No results for input(s): CKTOTAL, CKMB, RELINDX in the last 8760 hours.  Invalid input(s): TROPONINHS  No results found for: CKTOTAL, CKMB, CKMBINDEX  TSH Recent Labs    05/05/21 0011  TSH 3.716     (Not in a hospital admission)     Current Facility-Administered Medications:    0.9 %  sodium chloride infusion, , Intravenous, Continuous, Barton Dubois, MD, Last Rate: 100 mL/hr at 05/05/21 0935, Rate Change at 05/05/21 0935   0.9 %  sodium chloride infusion, , Intravenous, Continuous, Brandonn Capelli J, MD   acetaminophen (TYLENOL) tablet 650 mg, 650 mg, Oral, Q4H PRN, Barton Dubois, MD   ALPRAZolam Duanne Moron) tablet 0.25 mg, 0.25 mg, Oral, BID PRN, Barton Dubois, MD   aspirin EC tablet 81 mg, 81 mg, Oral, Daily, Barton Dubois, MD, 81 mg at 05/05/21 0936   atorvastatin (LIPITOR) tablet 40 mg, 40 mg, Oral, Daily, Barton Dubois, MD, 40 mg at 05/05/21 0936   heparin ADULT infusion 100 units/mL (25000 units/236mL), 1,750 Units/hr, Intravenous, Continuous, Barton Dubois, MD, Last Rate: 17.5 mL/hr at 05/05/21 1041, 1,750 Units/hr at 05/05/21 1041   metoprolol tartrate (LOPRESSOR) tablet 25 mg, 25 mg, Oral, BID, Barton Dubois, MD, 25 mg at 05/05/21 0936   nitroGLYCERIN (NITROSTAT) SL tablet 0.4 mg, 0.4 mg, Sublingual, Q5 min PRN, Barton Dubois, MD   ondansetron [facility]) injection 4 mg, 4 mg, Intravenous, Q6H PRN, Barton Dubois, MD   pantoprazole (PROTONIX) EC tablet 40 mg, 40 mg, Oral, Daily, Barton Dubois, MD, 40 mg at 05/05/21 1610  Current Outpatient Medications:    albuterol (VENTOLIN HFA) 108 (90 Base) MCG/ACT inhaler, Inhale 1-2 puffs into the lungs every 6 (six) hours as needed for wheezing or shortness of breath., Disp: , Rfl:    atorvastatin (LIPITOR) 20 MG  tablet, Take 20 mg by mouth daily., Disp: , Rfl:    cholecalciferol (VITAMIN D) 25 MCG (1000 UNIT) tablet, Take 1,000 Units by mouth daily., Disp: , Rfl:    pantoprazole (PROTONIX) 40 MG tablet, Take 40 mg by mouth daily., Disp: , Rfl:    valsartan-hydrochlorothiazide (DIOVAN-HCT) 160-12.5 MG tablet, Take 1 tablet by mouth daily., Disp: , Rfl:    Today's Vitals   05/05/21 1015 05/05/21 1030 05/05/21 1045 05/05/21 1123  BP: (!) 127/96 124/82 117/87   Pulse: 76 63 64   Resp: 16 17    Temp:      TempSrc:      SpO2: 94% 96% 95%   Weight:      Height:      PainSc:    0-No pain   Body mass index is 43.4 kg/m.     CARDIAC STUDIES:  EKG 05/04/2021: Sinus tachycardia 114 bpm No acute ischemic changes  Echocardiogram: Pending    Assessment/Plan  40 y.o. Caucasian male  with hypertension, asthma, OSA on CPAP, stable nonspecific hilar and mediastinal adenopathy followed by pulmonology, h/o DVT, now admitted with chest pain, NSTEMI  NSTEMI: Peak Trop HS 4600.  Currently chest pain-free. Continue IV heparin.  Plan for coronary angiogram and possible intervention today. Echocardiogram  Hilar and mediastinal adenopathy: Nonspecific, stable Outpatient f/u a. pulmonology   Nigel Mormon, MD Pager: [phone] Office: [phone]

## 2021-05-05 NOTE — Progress Notes (Signed)
ANTICOAGULATION CONSULT NOTE - Initial Consult  Pharmacy Consult for heparin Indication: chest pain/ACS  No Known Allergies  Patient Measurements: Height: 6' (182.9 cm) Weight: (!) 145.2 kg (320 lb) IBW/kg (Calculated) : 77.6 Heparin Dosing Weight: 111.4 kg  Vital Signs: Temp: 98.4 F (36.9 C) (06/12 2352) Temp Source: Oral (06/12 2352) BP: 115/71 (06/13 0030) Pulse Rate: 102 (06/13 0030)  Labs: Recent Labs    05/05/21 0011  HGB 16.6  HCT 50.7  PLT 190  CREATININE 1.68*  TROPONINIHS 4,161*    Estimated Creatinine Clearance: 84.7 mL/min (A) (by C-G formula based on SCr of 1.68 mg/dL (H)).   Medical History: Past Medical History:  Diagnosis Date   Asthma    DVT (deep venous thrombosis) (HCC)    Dyspnea    at times - no known reason.    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   Pneumonia    12- 30 years of age    Medications:  See medication history  Assessment: 40 yo man to start heparin for CP.  He was not on anticoagulation PTA.  Hg 16.6, PTLC 190 Goal of Therapy:  Heparin level 0.3-0.7 units/ml Monitor platelets by anticoagulation protocol: Yes   Plan:  Heparin 4000 unit bolus and drip at 1400 units/hr Check heparin level in 6-8 hours Daily HL and CBC Monitor for bleeding complications  Excell Seltzer Poteet 05/05/2021,1:02 AM

## 2021-05-05 NOTE — Progress Notes (Signed)
Received  via [facility] alert and oriented X 4, skin warm and dry, resp even and unlabored.  Pt denies any chest pain or discomfort at this time.  IVF infusing with a Heparin drip in the right AC, SL in the left AC.  Pt put on monitor, and consent signed.  Pt waiting for cath procedure.

## 2021-05-05 NOTE — ED Notes (Signed)
Date and time results received: 05/05/21 0306  Test: troponin Critical Value: 4682  Name of Provider Notified: Mesner, MD  Orders Received? Or Actions Taken?: acknowledged

## 2021-05-05 NOTE — Interval H&P Note (Signed)
History and Physical Interval Note:  05/05/2021 3:42 PM  Zachary Weeks  has presented today for surgery, with the diagnosis of nstemi.  The various methods of treatment have been discussed with the patient and family. After consideration of risks, benefits and other options for treatment, the patient has consented to  Procedure(s): LEFT HEART CATH AND CORONARY ANGIOGRAPHY (N/A) as a surgical intervention.  The patient's history has been reviewed, patient examined, no change in status, stable for surgery.  I have reviewed the patient's chart and labs.  Questions were answered to the patient's satisfaction.    2016 Appropriate Use Criteria for Coronary Revascularization in Patients With Acute Coronary Syndrome NSTEMI/UA High Risk (TIMI Score 5-7) NSTEMI/Unstable angina, stabilized patient at high risk Link Here: sistemancia.com Indication:  Revascularization by PCI or CABG of 1 or more arteries in a patient with NSTEMI or unstable angina with Stabilization after presentation High risk for clinical events A (7) Indication: 16; Score 7   [place]

## 2021-05-05 NOTE — ED Provider Notes (Signed)
[facility] EMERGENCY DEPARTMENT Provider Note   CSN: [id number] Arrival date & time: 05/04/21  2337     History Chief Complaint  Patient presents with   Chest Pain    Zachary Weeks is a 40 y.o. male.   Chest Pain Pain location:  Substernal area, L chest and L lateral chest Pain quality: sharp   Pain radiates to:  Does not radiate Pain severity:  Mild Onset quality:  Gradual Duration:  3 days Timing:  Intermittent Chronicity:  New Context: not breathing, not intercourse, not lifting, not raising an arm, not stress and not trauma   Relieved by:  None tried Worsened by:  Nothing Ineffective treatments:  None tried     Past Medical History:  Diagnosis Date   Asthma    DVT (deep venous thrombosis) (HCC)    Dyspnea    at times - no known reason.    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   Pneumonia    67- 61 years of age    Patient Active Problem List   Diagnosis Date Noted   Mediastinal lymphadenopathy 10/18/2020   OSA (obstructive sleep apnea) 10/18/2020   CKD (chronic kidney disease) stage 3, GFR 30-59 ml/min ([place]) 10/18/2020   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    Past Surgical History:  Procedure Laterality Date   BRONCHIAL NEEDLE ASPIRATION BIOPSY  11/01/2020   Procedure: BRONCHIAL NEEDLE ASPIRATION BIOPSIES;  Surgeon: Rigoberto Noel, MD;  Location: [facility] ENDOSCOPY;  Service: Cardiopulmonary;;   BRONCHIAL WASHINGS  11/01/2020   Procedure: BRONCHIAL WASHINGS;  Surgeon: Rigoberto Noel, MD;  Location: [facility] ENDOSCOPY;  Service: Cardiopulmonary;;   NO PAST SURGERIES     VIDEO BRONCHOSCOPY N/A 11/01/2020   Procedure: VIDEO BRONCHOSCOPY WITH ENDOBRONCHIAL ULTRASOUND;  Surgeon: Rigoberto Noel, MD;  Location: [facility];  Service: Cardiopulmonary;  Laterality: N/A;       Family History  Problem Relation Age of Onset   Pulmonary fibrosis Mother    Cancer Mother    Cancer  Father        prostate   Hypertension Father    Lupus Maternal Grandmother    Other Maternal Grandmother        clotting disorder   Fibromyalgia Maternal Aunt    Other Other        clotting disorder    Social History   Tobacco Use   Smoking status: Former    Packs/day: 1.00    Years: 3.00    Pack years: 3.00    Types: Cigarettes    Quit date: 1999    Years since quitting: 23.4   Smokeless tobacco: Current    Types: Chew, Snuff  Vaping Use   Vaping Use: Never used  Substance Use Topics   Alcohol use: Yes    Comment: rarely- maybe 2 mixed drinks in a year   Drug use: Never    Home Medications Prior to Admission medications   Medication Sig Start Date End Date Taking? Authorizing Provider  albuterol (VENTOLIN HFA) 108 (90 Base) MCG/ACT inhaler Inhale 1-2 puffs into the lungs every 6 (six) hours as needed for wheezing or shortness of breath.    [provider]  atorvastatin (LIPITOR) 20 MG tablet Take 20 mg by mouth daily.    [provider]  cholecalciferol (VITAMIN D) 25 MCG (1000 UNIT) tablet Take 1,000 Units by mouth daily. 01/17/21   [provider]  Cholecalciferol 25 MCG (1000 UT) capsule Take by mouth. 01/17/21 01/17/22  [provider]  FEROSUL 325 (65 Fe) MG tablet Take 325 mg by mouth 4 (four) times daily. 01/17/21   [provider]  pantoprazole (PROTONIX) 40 MG tablet Take 40 mg by mouth daily.    [provider]  valsartan-hydrochlorothiazide (DIOVAN-HCT) 160-12.5 MG tablet Take 1 tablet by mouth daily.    [provider]    Allergies    Patient has no known allergies.  Review of Systems   Review of Systems  Cardiovascular:  Positive for chest pain.  All other systems reviewed and are negative.  Physical Exam Updated Vital Signs BP 128/75   Pulse 78   Temp 98.4 F (36.9 C) (Oral)   Resp (!) 21   Ht 6' (1.829 m)   Wt (!) 145.2 kg   SpO2 92%   BMI 43.40 kg/m   Physical Exam Vitals and  nursing note reviewed.  Constitutional:      Appearance: He is well-developed.  HENT:     Head: Normocephalic and atraumatic.  Cardiovascular:     Rate and Rhythm: Normal rate.  Pulmonary:     Effort: Pulmonary effort is normal. No respiratory distress.  Abdominal:     General: There is no distension.     Palpations: Abdomen is soft. There is no mass.     Tenderness: There is no abdominal tenderness. There is no guarding or rebound.  Musculoskeletal:        General: Normal range of motion.     Cervical back: Normal range of motion.     Right lower leg: No tenderness. No edema.     Left lower leg: No tenderness. No edema.  Skin:    General: Skin is warm and dry.  Neurological:     Mental Status: He is alert.    ED Results / Procedures / Treatments   Labs (all labs ordered are listed, but only abnormal results are displayed) Labs Reviewed  BASIC METABOLIC PANEL - Abnormal; Notable for the following components:      Result Value   Sodium 133 (*)    Glucose, Bld 234 (*)    Creatinine, Ser 1.68 (*)    GFR, Estimated 52 (*)    All other components within normal limits  CBC - Abnormal; Notable for the following components:   RBC 5.92 (*)    All other components within normal limits  TROPONIN I (HIGH SENSITIVITY) - Abnormal; Notable for the following components:   Troponin I (High Sensitivity) 4,161 (*)    All other components within normal limits  TROPONIN I (HIGH SENSITIVITY) - Abnormal; Notable for the following components:   Troponin I (High Sensitivity) 4,682 (*)    All other components within normal limits  BRAIN NATRIURETIC PEPTIDE  HEPARIN LEVEL (UNFRACTIONATED)    EKG EKG Interpretation  Date/Time:  Sunday May 04 2021 23:49:06 EDT Ventricular Rate:  114 PR Interval:  163 QRS Duration: 89 QT Interval:  346 QTC Calculation: 477 R Axis:   56 Text Interpretation: Sinus tachycardia Borderline prolonged QT interval Confirmed by Merrily Pew [phone]) on 05/04/2021  11:58:13 PM  Radiology DG Chest 2 View  Result Date: 05/05/2021 CLINICAL DATA:  Chest pain EXAM: CHEST - 2 VIEW COMPARISON:  11/07/2020 FINDINGS: Heart and mediastinal contours are within normal limits. No focal opacities or effusions. No acute bony abnormality. IMPRESSION: No active cardiopulmonary disease. Electronically Signed   By: Rolm Baptise M.D.  On: 05/05/2021 00:38   CT Angio Chest PE W and/or Wo Contrast  Result Date: 05/05/2021 CLINICAL DATA:  Intermittent chest pain for 3 days with shortness of breath, hypoxic at home. History of asthma his and EXAM: CT ANGIOGRAPHY CHEST CT ABDOMEN AND PELVIS WITH CONTRAST TECHNIQUE: Multidetector CT imaging of the chest was performed using the standard protocol during bolus administration of intravenous contrast. Multiplanar CT image reconstructions and MIPs were obtained to evaluate the vascular anatomy. Multidetector CT imaging of the abdomen and pelvis was performed using the standard protocol during bolus administration of intravenous contrast. CONTRAST:  33mL OMNIPAQUE IOHEXOL 350 MG/ML SOLN COMPARISON:  PET-CT 04/24/2020 (images only) renal ultrasound 12/27/2020, CT 02/19/2013, CT chest 08/23/2020 FINDINGS: CTA CHEST FINDINGS Cardiovascular: Satisfactory opacification the pulmonary arteries to the segmental level. No pulmonary artery filling defects are identified accounting for some mild motion artifact. Central pulmonary arteries are normal caliber. Normal heart size. No pericardial effusion. The aortic root is suboptimally assessed given cardiac pulsation artifact. The aorta is normal caliber. No acute luminal abnormality of the imaged aorta. No periaortic stranding or hemorrhage. Shared origin of the brachiocephalic and left common carotid arteries. Proximal great vessels are otherwise unremarkable and free of acute abnormality. No major venous abnormalities nor significant venous reflux. Mediastinum/Nodes: Multiple enlarged mediastinal and hilar  nodes are present. Index nodes include: Stable 22 mm subcarinal nodal deposit, difficult to the distinguish from the margins of the esophagus (6/155). Stable 15 mm AP window lymph node (6/114). Stable 17 mm right hilar node (6/148). Stable 12 mm left hilar node (6/157). No other acute abnormality of the esophagus. Trachea is unremarkable. Thyroid gland and thoracic inlet is free of acute abnormality. No concerning axillary adenopathy. Lungs/Pleura: Dependent atelectatic changes are present. Some mild subpleural reticular change, likely chronic scarring, present in the right middle lobe (6/213). Additional scarring, atelectasis versus less likely airspace disease in the dependent right lower lobe (6/190). No other focal airspace opacity. No pneumothorax or visible effusion. Airways are patent Musculoskeletal: No acute osseous abnormality or suspicious osseous lesion. Worrisome chest wall mass or lesion. CT ABDOMEN and PELVIS FINDINGS Hepatobiliary: No worrisome focal liver lesions. Smooth liver surface contour. Normal hepatic attenuation. Gallbladder largely decompressed accounting for some mild gallbladder wall thickening. No visible calcified gallstones. No biliary ductal dilatation. Pancreas: No pancreatic ductal dilatation or surrounding inflammatory changes. Spleen: Top normal splenic size. No concerning focal splenic lesion. Adrenals/Urinary Tract: Normal adrenal glands. Kidneys are normally located with symmetric enhancement and excretion. Slightly diminutive right kidney may reflect some asymmetric atrophy. No suspicious renal lesion. Few scattered punctate nonobstructing calculi are present both kidneys, largest measuring up to 2 mm in the left kidney (3/41). No obstructive urolithiasis or hydronephrosis. Urinary bladder is unremarkable. Stomach/Bowel: Distal esophagus, stomach and duodenal sweep are unremarkable. No small bowel wall thickening or dilatation. No evidence of obstruction. A normal appendix is  visualized. No colonic dilatation or wall thickening. Moderate colonic stool burden. Vascular/Lymphatic: No significant vascular findings are present. No enlarged abdominal or pelvic lymph nodes. Reproductive: Coarse eccentric calcification of the prostate, typically benign. No concerning abnormalities of the prostate or seminal vesicles. Other: No abdominopelvic free fluid or free gas. No bowel containing hernias. Musculoskeletal: No acute osseous abnormality or suspicious osseous lesion. Mild degenerative changes most pronounced at L5-S1. Additional mild degenerative features in the bilateral hips. Review of the MIP images confirms the above findings. IMPRESSION: 1. Stable appearance of diffuse nonspecific mediastinal and hilar adenopathy. 2. Minimal scarring in the right middle  lobe. 3. Dependent atelectasis focal subpleural opacity in the right lower lobe, likely additional atelectasis or scarring with airspace disease less favored. 4. No acute abdominopelvic abnormality. 5. Asymmetric atrophy of the right kidney. 6. Bilateral nonobstructing calculi without obstructive urolithiasis or hydronephrosis. These results were called by telephone at the time of interpretation on 05/05/2021 at 3:01 am to provider [facility] , who verbally acknowledged these results. Electronically Signed   By: Lovena Le M.D.   On: 05/05/2021 03:01   CT ABDOMEN PELVIS W CONTRAST  Result Date: 05/05/2021 CLINICAL DATA:  Intermittent chest pain for 3 days with shortness of breath, hypoxic at home. History of asthma his and EXAM: CT ANGIOGRAPHY CHEST CT ABDOMEN AND PELVIS WITH CONTRAST TECHNIQUE: Multidetector CT imaging of the chest was performed using the standard protocol during bolus administration of intravenous contrast. Multiplanar CT image reconstructions and MIPs were obtained to evaluate the vascular anatomy. Multidetector CT imaging of the abdomen and pelvis was performed using the standard protocol during bolus  administration of intravenous contrast. CONTRAST:  69mL OMNIPAQUE IOHEXOL 350 MG/ML SOLN COMPARISON:  PET-CT 04/24/2020 (images only) renal ultrasound 12/27/2020, CT 02/19/2013, CT chest 08/23/2020 FINDINGS: CTA CHEST FINDINGS Cardiovascular: Satisfactory opacification the pulmonary arteries to the segmental level. No pulmonary artery filling defects are identified accounting for some mild motion artifact. Central pulmonary arteries are normal caliber. Normal heart size. No pericardial effusion. The aortic root is suboptimally assessed given cardiac pulsation artifact. The aorta is normal caliber. No acute luminal abnormality of the imaged aorta. No periaortic stranding or hemorrhage. Shared origin of the brachiocephalic and left common carotid arteries. Proximal great vessels are otherwise unremarkable and free of acute abnormality. No major venous abnormalities nor significant venous reflux. Mediastinum/Nodes: Multiple enlarged mediastinal and hilar nodes are present. Index nodes include: Stable 22 mm subcarinal nodal deposit, difficult to the distinguish from the margins of the esophagus (6/155). Stable 15 mm AP window lymph node (6/114). Stable 17 mm right hilar node (6/148). Stable 12 mm left hilar node (6/157). No other acute abnormality of the esophagus. Trachea is unremarkable. Thyroid gland and thoracic inlet is free of acute abnormality. No concerning axillary adenopathy. Lungs/Pleura: Dependent atelectatic changes are present. Some mild subpleural reticular change, likely chronic scarring, present in the right middle lobe (6/213). Additional scarring, atelectasis versus less likely airspace disease in the dependent right lower lobe (6/190). No other focal airspace opacity. No pneumothorax or visible effusion. Airways are patent Musculoskeletal: No acute osseous abnormality or suspicious osseous lesion. Worrisome chest wall mass or lesion. CT ABDOMEN and PELVIS FINDINGS Hepatobiliary: No worrisome focal  liver lesions. Smooth liver surface contour. Normal hepatic attenuation. Gallbladder largely decompressed accounting for some mild gallbladder wall thickening. No visible calcified gallstones. No biliary ductal dilatation. Pancreas: No pancreatic ductal dilatation or surrounding inflammatory changes. Spleen: Top normal splenic size. No concerning focal splenic lesion. Adrenals/Urinary Tract: Normal adrenal glands. Kidneys are normally located with symmetric enhancement and excretion. Slightly diminutive right kidney may reflect some asymmetric atrophy. No suspicious renal lesion. Few scattered punctate nonobstructing calculi are present both kidneys, largest measuring up to 2 mm in the left kidney (3/41). No obstructive urolithiasis or hydronephrosis. Urinary bladder is unremarkable. Stomach/Bowel: Distal esophagus, stomach and duodenal sweep are unremarkable. No small bowel wall thickening or dilatation. No evidence of obstruction. A normal appendix is visualized. No colonic dilatation or wall thickening. Moderate colonic stool burden. Vascular/Lymphatic: No significant vascular findings are present. No enlarged abdominal or pelvic lymph nodes. Reproductive: Coarse eccentric calcification  of the prostate, typically benign. No concerning abnormalities of the prostate or seminal vesicles. Other: No abdominopelvic free fluid or free gas. No bowel containing hernias. Musculoskeletal: No acute osseous abnormality or suspicious osseous lesion. Mild degenerative changes most pronounced at L5-S1. Additional mild degenerative features in the bilateral hips. Review of the MIP images confirms the above findings. IMPRESSION: 1. Stable appearance of diffuse nonspecific mediastinal and hilar adenopathy. 2. Minimal scarring in the right middle lobe. 3. Dependent atelectasis focal subpleural opacity in the right lower lobe, likely additional atelectasis or scarring with airspace disease less favored. 4. No acute abdominopelvic  abnormality. 5. Asymmetric atrophy of the right kidney. 6. Bilateral nonobstructing calculi without obstructive urolithiasis or hydronephrosis. These results were called by telephone at the time of interpretation on 05/05/2021 at 3:01 am to provider [facility] , who verbally acknowledged these results. Electronically Signed   By: Lovena Le M.D.   On: 05/05/2021 03:01    Procedures .Critical Care  Date/Time: 05/05/2021 6:52 AM Performed by: Merrily Pew, MD Authorized by: Merrily Pew, MD   Critical care provider statement:    Critical care time (minutes):  45   Critical care was necessary to treat or prevent imminent or life-threatening deterioration of the following conditions:  Cardiac failure   Critical care was time spent personally by me on the following activities:  Discussions with consultants, evaluation of patient's response to treatment, examination of patient, ordering and performing treatments and interventions, ordering and review of laboratory studies, ordering and review of radiographic studies, pulse oximetry, re-evaluation of patient's condition, obtaining history from patient or surrogate and review of old charts   Medications Ordered in ED Medications  heparin ADULT infusion 100 units/mL (25000 units/277mL) (1,400 Units/hr Intravenous New Bag/Given 05/05/21 0118)  heparin bolus via infusion 4,000 Units (4,000 Units Intravenous Bolus from Bag 05/05/21 0118)  aspirin chewable tablet 324 mg (324 mg Oral Given 05/05/21 0112)  iohexol (OMNIPAQUE) 350 MG/ML injection 75 mL (75 mLs Intravenous Contrast Given 05/05/21 0216)    ED Course  I have reviewed the triage vital signs and the nursing notes.  Pertinent labs & imaging results that were available during my care of the patient were reviewed by me and considered in my medical decision making (see chart for details).    MDM Rules/Calculators/A&P                         Eval for chest pain. If xr negative, will need ct to  rule out PE.  Troponins high and getting higher. NSTEMI, heparin initiated. PE scan negative. D/w Dr. Terri Skains, requests hospitalist admission to [facility]. Discussed with Dr. Olevia Bowens, will be holdover for dayshift to see and put in bed request.   Final Clinical Impression(s) / ED Diagnoses Final diagnoses:  NSTEMI (non-ST elevated myocardial infarction) ([place])  Nonspecific chest pain    Rx / DC Orders ED Discharge Orders     None        Octavious Zidek, Corene Cornea, MD 05/05/21 [phone]

## 2021-05-05 NOTE — Progress Notes (Signed)
ANTICOAGULATION CONSULT NOTE -   Pharmacy Consult for heparin Indication: chest pain/ACS  No Known Allergies  Patient Measurements: Height: 6' (182.9 cm) Weight: (!) 145.2 kg (320 lb) IBW/kg (Calculated) : 77.6 Heparin Dosing Weight: 111.4 kg  Vital Signs: Temp: 98.4 F (36.9 C) (06/12 2352) Temp Source: Oral (06/12 2352) BP: 124/80 (06/13 1000) Pulse Rate: 71 (06/13 1000)  Labs: Recent Labs    05/05/21 0011 05/05/21 0204 05/05/21 0727 05/05/21 0915  HGB 16.6  --   --   --   HCT 50.7  --   --   --   PLT 190  --   --   --   HEPARINUNFRC  --   --   --  <0.10*  CREATININE 1.68*  --   --   --   TROPONINIHS 4,161* 4,682* 4,403* 3,829*     Estimated Creatinine Clearance: 84.7 mL/min (A) (by C-G formula based on SCr of 1.68 mg/dL (H)).   Medical History: Past Medical History:  Diagnosis Date   Asthma    DVT (deep venous thrombosis) (HCC)    Dyspnea    at times - no known reason.    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   Pneumonia    35- 17 years of age    Medications:  See medication history  Assessment: 40 yo man to start heparin for CP.  He was not on anticoagulation PTA.  Hg CBC WNL  HL <0.10- subtherapeutic  Troponin 3829  Goal of Therapy:  Heparin level 0.3-0.7 units/ml Monitor platelets by anticoagulation protocol: Yes   Plan:  Rebolus heparin 3000 units IV x 1 Increase heparin infusion to 1750 units/hr Check heparin level in 6-8 hours Daily HL and CBC Monitor for bleeding complications  Margot Ables, PharmD Clinical Pharmacist 05/05/2021 10:18 AM

## 2021-05-05 NOTE — ED Notes (Signed)
CRITICAL VALUE Troponin of 4161 reported to EDP at this time

## 2021-05-05 NOTE — Plan of Care (Signed)

## 2021-05-05 NOTE — Progress Notes (Signed)
Zachary Weeks for heparin Indication: chest pain/ACS  No Known Allergies  Patient Measurements: Height: 6' (182.9 cm) Weight: (!) 145.2 kg (320 lb) IBW/kg (Calculated) : 77.6 Heparin Dosing Weight: 111.4 kg  Vital Signs: Temp: 97.8 F (36.6 C) (06/13 1400) Temp Source: Oral (06/13 1400) BP: 119/61 (06/13 1510) Pulse Rate: 71 (06/13 1510)  Labs: Recent Labs    05/05/21 0011 05/05/21 0204 05/05/21 0727 05/05/21 0915  HGB 16.6  --   --   --   HCT 50.7  --   --   --   PLT 190  --   --   --   HEPARINUNFRC  --   --   --  <0.10*  CREATININE 1.68*  --   --  1.40*  TROPONINIHS 4,161* 4,682* 4,403* 3,829*     Estimated Creatinine Clearance: 101.7 mL/min (A) (by C-G formula based on SCr of 1.4 mg/dL (H)).   Assessment: [name] presented with chest pain and started on IV heparin.  Underwent cath today, which didn't reveal any treatable lesion.  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  Pharmacy consulted to resume IV heparin 8 hours post sheath removal.  Sheath removed at 1709.  No bleeding nor hematoma documented on procedural log.  Goal of Therapy:  Heparin level 0.3-0.7 units/ml Monitor platelets by anticoagulation protocol: Yes   Plan:  On 6/14 at 0115, restart heparin infusion at 1750 units/hr Check 6 hr heparin level Daily heparin level and CBC F/u heparin LOT  Farrell Broerman D. Mina Marble, PharmD, BCPS, [place] 05/05/2021, 5:59 PM

## 2021-05-05 NOTE — Plan of Care (Signed)

## 2021-05-05 NOTE — H&P (Signed)
History and Physical    Zachary Weeks OHY:[id number] DOB: [birth year] DOA: 05/04/2021  PCP: Caryl Bis, MD   Patient coming from: home  I have personally briefly reviewed patient's old medical records in [place]  Chief Complaint: chest pain  HPI: Zachary Weeks is a 40 y.o. male with medical history significant of hypertension, gastroesophageal flux disease, class III obesity, Obstructive sleep apnea on CPAP, nonspecific mediastinal adenopathy, history of asthma and prior history of DVT; who presented to the hospital secondary to chest pain.  Pain is localized in the middle of his chest slightly tilted to the left side; Not radiated, associated with mild shortness of breath when present; lasting 30 to 40 minutes at a time, pending placement for the last 3 days and a detailed admission essentially not relieved until he presented to the emergency department and received morphine and nitroglycerin.  Patient expressed some palpitations feelings and reports no fever, no nausea, no vomiting, no dysuria, no hematuria, no focal weakness, no headaches, no blurred vision or any other complaints.  Patient received 2 COVID vaccination shots in July and August of 2021 (Moderna; no Booster shots). Patient denies sick contacts.  ED Course: Chest x-ray demonstrating no acute cardiopulmonary process; CT angiogram hospital now pulmonary embolism.  Patient with a stable diffuse nonspecific mediastinal and hilar adenopathy appreciated on CT of the chest.  Patient is afebrile and otherwise hemodynamically stable.  Continue electrocardiogram demonstrated sinus tachycardia without acute ischemic process or significant T wave changes.  Patient's troponin peak 4600 meeting Criteria for NSTEMI. Cardiology consulted and recommended IVF's, 2-D echo, heparin drip and transfer to [facility] for cath evaluation.   Review of Systems: As per HPI otherwise all other systems reviewed and are negative.  Past Medical History:   Diagnosis Date   Asthma    DVT (deep venous thrombosis) (HCC)    Dyspnea    at times - no known reason.    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   Pneumonia    66- 69 years of age    Past Surgical History:  Procedure Laterality Date   BRONCHIAL NEEDLE ASPIRATION BIOPSY  11/01/2020   Procedure: BRONCHIAL NEEDLE ASPIRATION BIOPSIES;  Surgeon: Rigoberto Noel, MD;  Location: [facility] ENDOSCOPY;  Service: Cardiopulmonary;;   BRONCHIAL WASHINGS  11/01/2020   Procedure: BRONCHIAL WASHINGS;  Surgeon: Rigoberto Noel, MD;  Location: [facility] ENDOSCOPY;  Service: Cardiopulmonary;;   NO PAST SURGERIES     VIDEO BRONCHOSCOPY N/A 11/01/2020   Procedure: VIDEO BRONCHOSCOPY WITH ENDOBRONCHIAL ULTRASOUND;  Surgeon: Rigoberto Noel, MD;  Location: [facility];  Service: Cardiopulmonary;  Laterality: N/A;    Social History  reports that he quit smoking about 23 years ago. His smoking use included cigarettes. He has a 3.00 pack-year smoking history. His smokeless tobacco use includes chew and snuff. He reports current alcohol use. He reports that he does not use drugs.  No Known Allergies  Family History  Problem Relation Age of Onset   Pulmonary fibrosis Mother    Cancer Mother    Cancer Father        prostate   Hypertension Father    Lupus Maternal Grandmother    Other Maternal Grandmother        clotting disorder   Fibromyalgia Maternal Aunt    Other Other        clotting disorder    Prior  to Admission medications   Medication Sig Start Date End Date Taking? Authorizing Provider  albuterol (VENTOLIN HFA) 108 (90 Base) MCG/ACT inhaler Inhale 1-2 puffs into the lungs every 6 (six) hours as needed for wheezing or shortness of breath.    [provider]  atorvastatin (LIPITOR) 20 MG tablet Take 20 mg by mouth daily.    [provider]  cholecalciferol (VITAMIN D) 25 MCG (1000 UNIT) tablet Take  1,000 Units by mouth daily. 01/17/21   [provider]  Cholecalciferol 25 MCG (1000 UT) capsule Take by mouth. 01/17/21 01/17/22  [provider]  FEROSUL 325 (65 Fe) MG tablet Take 325 mg by mouth 4 (four) times daily. 01/17/21   [provider]  pantoprazole (PROTONIX) 40 MG tablet Take 40 mg by mouth daily.    [provider]  valsartan-hydrochlorothiazide (DIOVAN-HCT) 160-12.5 MG tablet Take 1 tablet by mouth daily.    [provider]    Physical Exam: Vitals:   05/05/21 0715 05/05/21 0730 05/05/21 0800 05/05/21 0845  BP: 113/71 121/80 119/90 115/80  Pulse: 78 84 72 88  Resp: (!) 21     Temp:      TempSrc:      SpO2: 94% 95% 95% 93%  Weight:      Height:        Constitutional: Afebrile, currently no complaining of active chest pain.  2 L nasal cannula supplementation in place.  Denies nausea or vomiting Vitals:   05/05/21 0715 05/05/21 0730 05/05/21 0800 05/05/21 0845  BP: 113/71 121/80 119/90 115/80  Pulse: 78 84 72 88  Resp: (!) 21     Temp:      TempSrc:      SpO2: 94% 95% 95% 93%  Weight:      Height:       Eyes: PERRL, lids and conjunctivae normal.  No icterus, no nystagmus. ENMT: Mucous membranes are moist. Posterior pharynx clear of any exudate or lesions. Neck: normal, supple, no masses, no thyromegaly; unable to properly assess JVD with body habitus.  Respiratory: clear to auscultation bilaterally, no wheezing, no crackles. Normal respiratory effort. No accessory muscle use.  Cardiovascular: Regular rate and rhythm, no murmurs / rubs / gallops. No extremity edema. 2+ pedal pulses.  Abdomen: Obese, no tenderness, no masses palpated. No hepatosplenomegaly. Bowel sounds positive.  Musculoskeletal: no clubbing / cyanosis. No joint deformity upper and lower extremities. Good ROM, no contractures. Normal muscle tone.  Skin: no rashes, no petechiae. Neurologic: CN 2-12 grossly intact. Sensation intact, DTR normal. Strength 5/5  in all 4.  Psychiatric: Normal judgment and insight. Alert and oriented x 3. Normal mood.   Labs on Admission: I have personally reviewed following labs and imaging studies  CBC: Recent Labs  Lab 05/05/21 0011  WBC 9.0  HGB 16.6  HCT 50.7  MCV 85.6  PLT 253    Basic Metabolic Panel: Recent Labs  Lab 05/05/21 0011  NA 133*  K 3.6  CL 100  CO2 24  GLUCOSE 234*  BUN 18  CREATININE 1.68*  CALCIUM 8.9    GFR: Estimated Creatinine Clearance: 84.7 mL/min (A) (by C-G formula based on SCr of 1.68 mg/dL (H)).  Urine analysis:    Component Value Date/Time   APPEARANCEUR Clear 01/23/2021 0914   GLUCOSEU Negative 01/23/2021 0914   BILIRUBINUR Negative 01/23/2021 0914   PROTEINUR Negative 01/23/2021 0914   NITRITE Negative 01/23/2021 0914   LEUKOCYTESUR Negative 01/23/2021 0914    Radiological Exams on Admission: DG  Chest 2 View  Result Date: 05/05/2021 CLINICAL DATA:  Chest pain EXAM: CHEST - 2 VIEW COMPARISON:  11/07/2020 FINDINGS: Heart and mediastinal contours are within normal limits. No focal opacities or effusions. No acute bony abnormality. IMPRESSION: No active cardiopulmonary disease. Electronically Signed   By: Rolm Baptise M.D.   On: 05/05/2021 00:38   CT Angio Chest PE W and/or Wo Contrast  Result Date: 05/05/2021 CLINICAL DATA:  Intermittent chest pain for 3 days with shortness of breath, hypoxic at home. History of asthma his and EXAM: CT ANGIOGRAPHY CHEST CT ABDOMEN AND PELVIS WITH CONTRAST TECHNIQUE: Multidetector CT imaging of the chest was performed using the standard protocol during bolus administration of intravenous contrast. Multiplanar CT image reconstructions and MIPs were obtained to evaluate the vascular anatomy. Multidetector CT imaging of the abdomen and pelvis was performed using the standard protocol during bolus administration of intravenous contrast. CONTRAST:  24mL OMNIPAQUE IOHEXOL 350 MG/ML SOLN COMPARISON:  PET-CT 04/24/2020 (images only)  renal ultrasound 12/27/2020, CT 02/19/2013, CT chest 08/23/2020 FINDINGS: CTA CHEST FINDINGS Cardiovascular: Satisfactory opacification the pulmonary arteries to the segmental level. No pulmonary artery filling defects are identified accounting for some mild motion artifact. Central pulmonary arteries are normal caliber. Normal heart size. No pericardial effusion. The aortic root is suboptimally assessed given cardiac pulsation artifact. The aorta is normal caliber. No acute luminal abnormality of the imaged aorta. No periaortic stranding or hemorrhage. Shared origin of the brachiocephalic and left common carotid arteries. Proximal great vessels are otherwise unremarkable and free of acute abnormality. No major venous abnormalities nor significant venous reflux. Mediastinum/Nodes: Multiple enlarged mediastinal and hilar nodes are present. Index nodes include: Stable 22 mm subcarinal nodal deposit, difficult to the distinguish from the margins of the esophagus (6/155). Stable 15 mm AP window lymph node (6/114). Stable 17 mm right hilar node (6/148). Stable 12 mm left hilar node (6/157). No other acute abnormality of the esophagus. Trachea is unremarkable. Thyroid gland and thoracic inlet is free of acute abnormality. No concerning axillary adenopathy. Lungs/Pleura: Dependent atelectatic changes are present. Some mild subpleural reticular change, likely chronic scarring, present in the right middle lobe (6/213). Additional scarring, atelectasis versus less likely airspace disease in the dependent right lower lobe (6/190). No other focal airspace opacity. No pneumothorax or visible effusion. Airways are patent Musculoskeletal: No acute osseous abnormality or suspicious osseous lesion. Worrisome chest wall mass or lesion. CT ABDOMEN and PELVIS FINDINGS Hepatobiliary: No worrisome focal liver lesions. Smooth liver surface contour. Normal hepatic attenuation. Gallbladder largely decompressed accounting for some mild  gallbladder wall thickening. No visible calcified gallstones. No biliary ductal dilatation. Pancreas: No pancreatic ductal dilatation or surrounding inflammatory changes. Spleen: Top normal splenic size. No concerning focal splenic lesion. Adrenals/Urinary Tract: Normal adrenal glands. Kidneys are normally located with symmetric enhancement and excretion. Slightly diminutive right kidney may reflect some asymmetric atrophy. No suspicious renal lesion. Few scattered punctate nonobstructing calculi are present both kidneys, largest measuring up to 2 mm in the left kidney (3/41). No obstructive urolithiasis or hydronephrosis. Urinary bladder is unremarkable. Stomach/Bowel: Distal esophagus, stomach and duodenal sweep are unremarkable. No small bowel wall thickening or dilatation. No evidence of obstruction. A normal appendix is visualized. No colonic dilatation or wall thickening. Moderate colonic stool burden. Vascular/Lymphatic: No significant vascular findings are present. No enlarged abdominal or pelvic lymph nodes. Reproductive: Coarse eccentric calcification of the prostate, typically benign. No concerning abnormalities of the prostate or seminal vesicles. Other: No abdominopelvic free fluid or free gas. No  bowel containing hernias. Musculoskeletal: No acute osseous abnormality or suspicious osseous lesion. Mild degenerative changes most pronounced at L5-S1. Additional mild degenerative features in the bilateral hips. Review of the MIP images confirms the above findings. IMPRESSION: 1. Stable appearance of diffuse nonspecific mediastinal and hilar adenopathy. 2. Minimal scarring in the right middle lobe. 3. Dependent atelectasis focal subpleural opacity in the right lower lobe, likely additional atelectasis or scarring with airspace disease less favored. 4. No acute abdominopelvic abnormality. 5. Asymmetric atrophy of the right kidney. 6. Bilateral nonobstructing calculi without obstructive urolithiasis or  hydronephrosis. These results were called by telephone at the time of interpretation on 05/05/2021 at 3:01 am to provider [facility] , who verbally acknowledged these results. Electronically Signed   By: Lovena Le M.D.   On: 05/05/2021 03:01   CT ABDOMEN PELVIS W CONTRAST  Result Date: 05/05/2021 CLINICAL DATA:  Intermittent chest pain for 3 days with shortness of breath, hypoxic at home. History of asthma his and EXAM: CT ANGIOGRAPHY CHEST CT ABDOMEN AND PELVIS WITH CONTRAST TECHNIQUE: Multidetector CT imaging of the chest was performed using the standard protocol during bolus administration of intravenous contrast. Multiplanar CT image reconstructions and MIPs were obtained to evaluate the vascular anatomy. Multidetector CT imaging of the abdomen and pelvis was performed using the standard protocol during bolus administration of intravenous contrast. CONTRAST:  56mL OMNIPAQUE IOHEXOL 350 MG/ML SOLN COMPARISON:  PET-CT 04/24/2020 (images only) renal ultrasound 12/27/2020, CT 02/19/2013, CT chest 08/23/2020 FINDINGS: CTA CHEST FINDINGS Cardiovascular: Satisfactory opacification the pulmonary arteries to the segmental level. No pulmonary artery filling defects are identified accounting for some mild motion artifact. Central pulmonary arteries are normal caliber. Normal heart size. No pericardial effusion. The aortic root is suboptimally assessed given cardiac pulsation artifact. The aorta is normal caliber. No acute luminal abnormality of the imaged aorta. No periaortic stranding or hemorrhage. Shared origin of the brachiocephalic and left common carotid arteries. Proximal great vessels are otherwise unremarkable and free of acute abnormality. No major venous abnormalities nor significant venous reflux. Mediastinum/Nodes: Multiple enlarged mediastinal and hilar nodes are present. Index nodes include: Stable 22 mm subcarinal nodal deposit, difficult to the distinguish from the margins of the esophagus (6/155).  Stable 15 mm AP window lymph node (6/114). Stable 17 mm right hilar node (6/148). Stable 12 mm left hilar node (6/157). No other acute abnormality of the esophagus. Trachea is unremarkable. Thyroid gland and thoracic inlet is free of acute abnormality. No concerning axillary adenopathy. Lungs/Pleura: Dependent atelectatic changes are present. Some mild subpleural reticular change, likely chronic scarring, present in the right middle lobe (6/213). Additional scarring, atelectasis versus less likely airspace disease in the dependent right lower lobe (6/190). No other focal airspace opacity. No pneumothorax or visible effusion. Airways are patent Musculoskeletal: No acute osseous abnormality or suspicious osseous lesion. Worrisome chest wall mass or lesion. CT ABDOMEN and PELVIS FINDINGS Hepatobiliary: No worrisome focal liver lesions. Smooth liver surface contour. Normal hepatic attenuation. Gallbladder largely decompressed accounting for some mild gallbladder wall thickening. No visible calcified gallstones. No biliary ductal dilatation. Pancreas: No pancreatic ductal dilatation or surrounding inflammatory changes. Spleen: Top normal splenic size. No concerning focal splenic lesion. Adrenals/Urinary Tract: Normal adrenal glands. Kidneys are normally located with symmetric enhancement and excretion. Slightly diminutive right kidney may reflect some asymmetric atrophy. No suspicious renal lesion. Few scattered punctate nonobstructing calculi are present both kidneys, largest measuring up to 2 mm in the left kidney (3/41). No obstructive urolithiasis or hydronephrosis. Urinary bladder is  unremarkable. Stomach/Bowel: Distal esophagus, stomach and duodenal sweep are unremarkable. No small bowel wall thickening or dilatation. No evidence of obstruction. A normal appendix is visualized. No colonic dilatation or wall thickening. Moderate colonic stool burden. Vascular/Lymphatic: No significant vascular findings are present.  No enlarged abdominal or pelvic lymph nodes. Reproductive: Coarse eccentric calcification of the prostate, typically benign. No concerning abnormalities of the prostate or seminal vesicles. Other: No abdominopelvic free fluid or free gas. No bowel containing hernias. Musculoskeletal: No acute osseous abnormality or suspicious osseous lesion. Mild degenerative changes most pronounced at L5-S1. Additional mild degenerative features in the bilateral hips. Review of the MIP images confirms the above findings. IMPRESSION: 1. Stable appearance of diffuse nonspecific mediastinal and hilar adenopathy. 2. Minimal scarring in the right middle lobe. 3. Dependent atelectasis focal subpleural opacity in the right lower lobe, likely additional atelectasis or scarring with airspace disease less favored. 4. No acute abdominopelvic abnormality. 5. Asymmetric atrophy of the right kidney. 6. Bilateral nonobstructing calculi without obstructive urolithiasis or hydronephrosis. These results were called by telephone at the time of interpretation on 05/05/2021 at 3:01 am to provider [facility] , who verbally acknowledged these results. Electronically Signed   By: Lovena Le M.D.   On: 05/05/2021 03:01    EKG: Independently reviewed.  Sinus tachycardia, no acute ischemic changes.  Normal QT.  Assessment/Plan 1-chest pain/NSTEMI (non-ST elevated myocardial infarction) ([place]) -Patient has been started on heparin drip, as needed nitroglycerin, metoprolol, Lipitor and aspirin. -As needed oxygen has been provided -As needed morphine and PPIs also given. -Case discussed with cardiology service plan is for him to be transferred to [facility] for coronary angiogram and possible intervention. -IV fluids in preparation for cardiac cath has been initiated. -2D echo has been ordered. -Checking lipid panel and A1c  2-essential hypertension -Stable and well-controlled -Continue the use of metoprolol twice a day currently   -Heart healthy diet has been discussed with patient.  3-hyperlipidemia -Will check lipid panel -Continue statins.   4-hyperglycemia -No prior history of diabetes -No shortness of neck 230 range -Repeat fasting CBGs and check A1c -patient will require most likely hypoglycemic regimen initiation at discharge -advise to lose weight   5-morbid obesity/O -continue CPAP QHS -Low calorie diet , portion control and increase physical activity discussed with patient. -Body mass index is 43.4 kg/m.  6-chronic kidney disease a stage IIIa -essentially at baseline  -gentle hydration initiated in preparation for cardiac cath. -minimize nephrotoxic drugs and avoid hypotension  -continue holding diovan and HCTZ currently.  7-GERD -continue PPI  8-hx of asthma  -mild intermittent/seasonal -continue PRN bronchodilators   9-chronic mediastinal lymphadenopathy -Appears stable -Continue outpatient follow-up with Pulmonology service.   DVT prophylaxis: Heparin drip Code Status:  Full code Family Communication:  No further lipids- Disposition Plan:   Patient is from:  Home  Anticipated DC to:  Home  Anticipated DC date:  To be determined   Anticipated DC barriers: Complete evaluation/work-up and treatment for ACS/NSTEMI Consults called:  Cardiology service. Admission status:  Observation status; telemetry bed. LOS < 2 midnights.  Severity of Illness: The appropriate patient status for this patient is OBSERVATION. Observation status is judged to be reasonable and necessary in order to provide the required intensity of service to ensure the patient's safety. The patient's presenting symptoms, physical exam findings, and initial radiographic and laboratory data in the context of their medical condition is felt to place them at decreased risk for further clinical deterioration. Furthermore, it is anticipated  that the patient will be medically stable for discharge from the hospital within 2  midnights of admission. The following factors support the patient status of observation.   " The patient's presenting symptoms include chest pain. " The physical exam findings include chest discomfort; with associated complaints of shortness of breath. " The initial radiographic and laboratory data are no cardiopulmonary process appreciated chest x-ray; no pulmonary embolism seen on CT angiogram.  Stable chronic lymphadenopathy appreciated.  No acute ischemic changes on EKG.  Significant elevation of patient's troponin, meeting criteria for NSTEMI.    Barton Dubois MD [facility]  How to contact the [facility] Attending or Consulting provider [place] or covering provider during after hours [place], for this patient?   Check the care team in [facility] and look for a) attending/consulting [facility] provider listed and b) the [facility] team listed Log into [email or web address] and use [place]'s universal password to access. If you do not have the password, please contact the hospital operator. Locate the [facility] provider you are looking for under [facility] and page to a number that you can be directly reached. If you still have difficulty reaching the provider, please page the [facility] (Director on Call) for the Hospitalists listed on amion for assistance.  05/05/2021, 9:32 AM

## 2021-05-05 NOTE — ED Notes (Signed)
Slight change noted in mnihs

## 2021-05-06 ENCOUNTER — Other Ambulatory Visit (HOSPITAL_COMMUNITY): Payer: Self-pay

## 2021-05-06 ENCOUNTER — Observation Stay (HOSPITAL_COMMUNITY): Payer: Managed Care, Other (non HMO)

## 2021-05-06 ENCOUNTER — Encounter (HOSPITAL_COMMUNITY): Payer: Self-pay | Admitting: Cardiology

## 2021-05-06 DIAGNOSIS — R590 Localized enlarged lymph nodes: Secondary | ICD-10-CM | POA: Diagnosis not present

## 2021-05-06 DIAGNOSIS — J9601 Acute respiratory failure with hypoxia: Secondary | ICD-10-CM

## 2021-05-06 DIAGNOSIS — R739 Hyperglycemia, unspecified: Secondary | ICD-10-CM | POA: Diagnosis present

## 2021-05-06 DIAGNOSIS — R0902 Hypoxemia: Secondary | ICD-10-CM

## 2021-05-06 DIAGNOSIS — I214 Non-ST elevation (NSTEMI) myocardial infarction: Secondary | ICD-10-CM | POA: Diagnosis not present

## 2021-05-06 DIAGNOSIS — G4733 Obstructive sleep apnea (adult) (pediatric): Secondary | ICD-10-CM

## 2021-05-06 DIAGNOSIS — I10 Essential (primary) hypertension: Secondary | ICD-10-CM | POA: Diagnosis present

## 2021-05-06 DIAGNOSIS — E785 Hyperlipidemia, unspecified: Secondary | ICD-10-CM | POA: Diagnosis present

## 2021-05-06 DIAGNOSIS — K219 Gastro-esophageal reflux disease without esophagitis: Secondary | ICD-10-CM | POA: Diagnosis present

## 2021-05-06 DIAGNOSIS — N1831 Chronic kidney disease, stage 3a: Secondary | ICD-10-CM | POA: Diagnosis not present

## 2021-05-06 LAB — BLOOD GAS, ARTERIAL
Acid-Base Excess: 1.2 mmol/L (ref 0.0–2.0)
Bicarbonate: 25.5 mmol/L (ref 20.0–28.0)
Drawn by: 39898
FIO2: 21
O2 Saturation: 94.6 %
Patient temperature: 37
pCO2 arterial: 42.1 mmHg (ref 32.0–48.0)
pH, Arterial: 7.4 (ref 7.350–7.450)
pO2, Arterial: 75.3 mmHg — ABNORMAL LOW (ref 83.0–108.0)

## 2021-05-06 LAB — CBC
HCT: 49.9 % (ref 39.0–52.0)
Hemoglobin: 16.1 g/dL (ref 13.0–17.0)
MCH: 27.4 pg (ref 26.0–34.0)
MCHC: 32.3 g/dL (ref 30.0–36.0)
MCV: 84.9 fL (ref 80.0–100.0)
Platelets: 198 10*3/uL (ref 150–400)
RBC: 5.88 MIL/uL — ABNORMAL HIGH (ref 4.22–5.81)
RDW: 13.6 % (ref 11.5–15.5)
WBC: 8.4 10*3/uL (ref 4.0–10.5)
nRBC: 0 % (ref 0.0–0.2)

## 2021-05-06 LAB — BASIC METABOLIC PANEL
Anion gap: 9 (ref 5–15)
BUN: 16 mg/dL (ref 6–20)
CO2: 25 mmol/L (ref 22–32)
Calcium: 8.7 mg/dL — ABNORMAL LOW (ref 8.9–10.3)
Chloride: 98 mmol/L (ref 98–111)
Creatinine, Ser: 1.46 mg/dL — ABNORMAL HIGH (ref 0.61–1.24)
GFR, Estimated: >60 mL/min (ref >60)
Glucose, Bld: 130 mg/dL — ABNORMAL HIGH (ref 70–99)
Potassium: 5.3 mmol/L — ABNORMAL HIGH (ref 3.5–5.1)
Sodium: 132 mmol/L — ABNORMAL LOW (ref 135–145)

## 2021-05-06 LAB — ECHOCARDIOGRAM COMPLETE
Area-P 1/2: 2.8 cm2
Height: 72 in
S' Lateral: 2.5 cm
Weight: 5160.53 oz

## 2021-05-06 LAB — HEPARIN LEVEL (UNFRACTIONATED): Heparin Unfractionated: 0.1 IU/mL — ABNORMAL LOW (ref 0.30–0.70)

## 2021-05-06 LAB — GLUCOSE, CAPILLARY
Glucose-Capillary: 116 mg/dL — ABNORMAL HIGH (ref 70–99)
Glucose-Capillary: 144 mg/dL — ABNORMAL HIGH (ref 70–99)

## 2021-05-06 LAB — HEMOGLOBIN A1C
Hgb A1c MFr Bld: 6.6 % — ABNORMAL HIGH (ref 4.8–5.6)
Mean Plasma Glucose: 143 mg/dL

## 2021-05-06 LAB — POTASSIUM: Potassium: 4.2 mmol/L (ref 3.5–5.1)

## 2021-05-06 LAB — BRAIN NATRIURETIC PEPTIDE: B Natriuretic Peptide: 28.3 pg/mL (ref 0.0–100.0)

## 2021-05-06 MED ORDER — MOMETASONE FURO-FORMOTEROL FUM 100-5 MCG/ACT IN AERO
2.0000 | INHALATION_SPRAY | Freq: Two times a day (BID) | RESPIRATORY_TRACT | Status: DC
  Filled 2021-05-06: qty 8.8

## 2021-05-06 MED ORDER — FUROSEMIDE 10 MG/ML IJ SOLN
40.0000 mg | Freq: Once | INTRAMUSCULAR | Status: AC
  Administered 2021-05-06: 40 mg via INTRAVENOUS
  Filled 2021-05-06: qty 4

## 2021-05-06 MED ORDER — INSULIN ASPART 100 UNIT/ML IJ SOLN
0.0000 [IU] | Freq: Three times a day (TID) | INTRAMUSCULAR | Status: DC
  Administered 2021-05-07 (×2): 1 [IU] via SUBCUTANEOUS

## 2021-05-06 MED ORDER — PERFLUTREN LIPID MICROSPHERE
1.0000 mL | INTRAVENOUS | Status: AC | PRN
  Administered 2021-05-06: 2 mL via INTRAVENOUS
  Filled 2021-05-06: qty 10

## 2021-05-06 MED FILL — Nitroglycerin IV Soln 100 MCG/ML in D5W: INTRA_ARTERIAL | Qty: 10 | Status: AC

## 2021-05-06 NOTE — Plan of Care (Signed)

## 2021-05-06 NOTE — TOC Benefit Eligibility Note (Signed)
Patient Teacher, English as a foreign language completed.    The patient is currently admitted and upon discharge could be taking Brilinta 90 mg.  The current 30 day co-pay is, $25.00.   The patient is insured through [place], [place] Patient Advocate Specialist [place] Team Direct Number: [phone]  Fax: [phone]

## 2021-05-06 NOTE — Progress Notes (Addendum)
[place] for heparin Indication: chest pain/ACS  No Known Allergies  Patient Measurements: Height: 6' (182.9 cm) Weight: (!) (P) 146.3 kg (322 lb 8.5 oz) IBW/kg (Calculated) : 77.6 Heparin Dosing Weight: 111.4 kg  Vital Signs: Temp: 98.7 F (37.1 C) (06/14 0730) Temp Source: Oral (06/14 0730) BP: 125/86 (06/14 0730) Pulse Rate: 82 (06/14 0730)  Labs: Recent Labs    05/05/21 0011 05/05/21 0204 05/05/21 0727 05/05/21 0915 05/06/21 0021 05/06/21 0804  HGB 16.6  --   --   --  16.1  --   HCT 50.7  --   --   --  49.9  --   PLT 190  --   --   --  198  --   HEPARINUNFRC  --   --   --  <0.10*  --  0.10*  CREATININE 1.68*  --   --  1.40* 1.46*  --   TROPONINIHS 4,161* 5,170* 4,403* 3,829*  --   --      Estimated Creatinine Clearance: 97.5 mL/min (A) (by C-G formula based on SCr of 1.46 mg/dL (H)).   Assessment: 62 YOM presented with chest pain and started on IV heparin.  Underwent cath yesterday, which didn't reveal any treatable lesion.  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  Pharmacy consulted to resume IV heparin 8 hours post sheath removal.  CTA on admission negative for PE. Heparin level this morning came back subtherapeutic at 0.1, on 1750 units/hr. CBC stable. No s/sx of bleeding or infusion issues.   Goal of Therapy:  Heparin level 0.3-0.7 units/ml Monitor platelets by anticoagulation protocol: Yes   Plan:  Increase heparin infusion at 2000 units/hr Check 6 hr heparin level Daily heparin level and CBC F/u heparin LOT  Antonietta Jewel, PharmD, [place] Pharmacist  Phone: [phone] 05/06/2021 11:47 AM  Please check AMION for all [place] phone numbers After 10:00 PM, call [place] [phone]   ADDENDUM Talked with cardiology and confirmed okay for total 48 hours. Stop time entered - given time of heparin stop will not recheck level.   Antonietta Jewel, PharmD, [place] Clinical  Pharmacist

## 2021-05-06 NOTE — Consult Note (Signed)
NAME:  Zachary Weeks, MRN:  [id number], DOB:  12/20/1980, LOS: 0 ADMISSION DATE:  05/04/2021, CONSULTATION DATE:  6/14 REFERRING MD:  Dr. Virgina Jock, CHIEF COMPLAINT:  Chest pain  History of Present Illness:  40 y/o M who presented to [facility] on 6/12 with reports of chest pain and shortness of breath.    He is followed by Dr. Elsworth Soho for nonspecific hilar and mediastinal adenopathy.  He has hx of DVT in 2020.  He subsequently underwent hypercoagulable work up which was notable for ACE level of 64 and positive PCR-ABL.  There were initial concerns for possible AML, but ultimately thought not the source of LAN.  He underwent TBNA 10/2020 with scant lymphoid tissue, negative cytology.  He is followed by [facility] for CKD IIIa of unclear etiology.   Autoimmune Work up from [employer].   Autoimmune work-up done on February 21, 2020 showed Rheumatoid factor was negative Patient quantifiron on was negative Patient ANA was negative Patient angiotensin converting enzyme was negative Patient anti-SSA antibody and anti-SSB antibody were negative Patient ESR was 17 mm/hr Patient factor V Leiden test was negative  Patient HIV was negative on February 21, 2020 Patient hepatitis profile was negative on March 31 as well Patient hepatitis A/B surface antigen/hep B core antibody Hep C antibody for negative  Hemoglobin was 13.4    On presentation 6/12, the patient reported symptoms began 3 days prior to admit. He was found to have saturations in the 80's.  Initial work up in the ER showed an EKG with sinus tachycardia without acute ST changes.  High sensitivity troponin peaked at 4600.  CXR showed no acute process.  CTA of the chest demonstrated stable diffuse non-specific mediastinal and hilar adenopathy, dependent atelectasis and minimal scarring.  He had a sr cr of 1.68 on admit with baseline ~ 1.4 / CKD IIIa.  The patient was transferred to [facility] for further Cardiac evaluation.     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 No identifiable treatable lesion.  He was recommended DAPT with ASA and Brilinta. Post LHC, he was hypoxic requiring 4L O2.  He briefly wore CPAP overnight on 6/14 but was taken off due to mask fit.   PCCM consulted for evaluation of hypoxia.   He denies rashes near tattoos, rashes on skin.   Pertinent  Medical History  Asthma Former Smoker - quit 1999 OSA - on CPAP, home sleep test 11/2020 with severe OSA Non-specific hilar and mediastinal adenopathy - TBNA 10/2020 with scant lymphoid tissue DVT - 2020 PNA - as a child  Dyspnea GERD Renal Calculi  HTN  BMI 43.74 Family hx - lupus, clotting disorder (3 family members with hx of lupus anticoagulant), pulmonary fibrosis   Significant Hospital Events: Including procedures, antibiotic start and stop dates in addition to other pertinent events   6/12 Admit with NSTEMI 6/14 PCCM consulted for hypoxia   Interim History / Subjective:  Afebrile  Sats 92% on 4L > pt able to pull 2.5L on IS   Objective   Blood pressure 133/78, pulse 78, temperature 98.7 F (37.1 C), temperature source Oral, resp. rate 18, height 6' (1.829 m), weight (!) (P) 146.3 kg, SpO2 92 %.        Intake/Output Summary (Last 24 hours) at 05/06/2021 1419 Last data filed at 05/06/2021 1207 Gross per 24 hour  Intake 1063.69 ml  Output 2650 ml  Net -1586.31 ml   Filed Weights   05/04/21 2353 05/06/21 1056  Weight: (!) 145.2 kg (!) (P) 146.3 kg    Examination: General: adult male lying in bed in NAD HENT: MM pink/moist, [place] O2, anicteric, wearing glasses  Lungs: non-labored on [place] O2, lungs bilaterally clear. Able to pull 2.5L on IS with improvement in sats from 90-96% Cardiovascular: S1S2 RRR, SR 80's Abdomen: obese/soft, bsx4 active  Extremities: warm/dry, multiple tattoos Neuro: AAOx4, speech clear, MAE    Resolved Hospital  Problem list     Assessment & Plan:   Acute Hypoxemic Respiratory Failure  RML Scarring Non-Specific Hilar Adenopathy Admit with NSTEMI now s/p LHC with new 4L O2 requirement.  CTA of chest with stable LAN, pulmonary trunk appears approximately the same size as aorta.  CT findings of LAN do not explain acute hypoxia - absence of acute airspace disease, no significant edema. Mild airway thickening.   HIV negative.  Prior autoimmune work up essentially negative.  Acutely, his symptoms are likely related to possible contribution of hypoventilation - as he has not worn positive pressure while inpatient due to mask fit. Other considerations include possible underlying autoimmune process, question of WHO II/III pulmonary hypertension (pulmonary trunk almost same size as aorta).      -assess differential, ABG to r/o hypoventilation syndrome -ensure compliance with CPAP QHS -wean O2 for sats >90%, if unable to wean O2 while inpatient would consider bubble study to ensure no shunt, & work up for pulmonary hypertension in the absence of significant parenchymal disease -follow intermittent imaging  -will need PFT's as outpatient  -weight loss encouraged -pulmonary hygiene -IS, mobilize, ambulate in hall  -recommend follow up with Dr. Elsworth Soho at discharge -assess ambulatory O2 needs prior to discharge   Asthma  -Dulera while inpatient, ensure pt rinses mouth after use  -assess differential as above for eosinophils   OSA on CPAP  -continue nocturnal CPAP, auto titration 5-15cm H20  NSTEMI  -per Cardiology  -DAPT with ASA, Brilinta   Best practice (right click and "Reselect all SmartList Selections" daily)  Per primary   Labs   CBC: Recent Labs  Lab 05/05/21 0011 05/06/21 0021  WBC 9.0 8.4  HGB 16.6 16.1  HCT 50.7 49.9  MCV 85.6 84.9  PLT 190 720    Basic Metabolic Panel: Recent Labs  Lab 05/05/21 0011 05/05/21 0915 05/06/21 0021 05/06/21 0804  NA 133* 134* 132*  --   K 3.6 4.1  5.3* 4.2  CL 100 101 98  --   CO2 _0 --   GLUCOSE 234* 141* 130*  --   BUN _1 --   CREATININE 1.68* 1.40* 1.46*  --   CALCIUM 8.9 8.5* 8.7*  --    GFR: Estimated Creatinine Clearance: 97.5 mL/min (A) (by C-G formula based on SCr of 1.46 mg/dL (H)). Recent Labs  Lab 05/05/21 0011 05/06/21 0021  WBC 9.0 8.4    Liver Function Tests: No results for input(s): AST, ALT, ALKPHOS, BILITOT, PROT, ALBUMIN in the last 168 hours. No results for input(s): LIPASE, AMYLASE in the last 168 hours. No results for input(s): AMMONIA in the last 168 hours.  ABG No results found for: PHART, PCO2ART, PO2ART, HCO3, TCO2, ACIDBASEDEF, O2SAT   Coagulation Profile: No results for input(s): INR, PROTIME in the last 168 hours.  Cardiac Enzymes: No results for input(s): CKTOTAL, CKMB, CKMBINDEX, TROPONINI in the last 168 hours.  HbA1C: Hgb A1c MFr Bld  Date/Time Value  Ref Range Status  05/05/2021 12:11 AM 6.6 (H) 4.8 - 5.6 % Final    Comment:    (NOTE)         Prediabetes: 5.7 - 6.4         Diabetes: >6.4         Glycemic control for adults with diabetes: <7.0     CBG: No results for input(s): GLUCAP in the last 168 hours.  Review of Systems:  Positives in [place]  Gen: Denies fever, chills, weight change, fatigue, night sweats HEENT: Denies blurred vision, double vision, hearing loss, tinnitus, sinus congestion, rhinorrhea, sore throat, neck stiffness, dysphagia PULM: Denies shortness of breath, cough, sputum production, hemoptysis, wheezing CV: Denies chest pain, edema, orthopnea, paroxysmal nocturnal dyspnea, palpitations GI: Denies abdominal pain, nausea, vomiting, diarrhea, hematochezia, melena, constipation, change in bowel habits GU: Denies dysuria, hematuria, polyuria, oliguria, urethral discharge Endocrine: Denies hot or cold intolerance, polyuria, polyphagia or appetite change Derm: Denies rash, dry skin, scaling or peeling skin change Heme: Denies easy bruising,  bleeding, bleeding gums Neuro: Denies headache, numbness, weakness, slurred speech, loss of memory or consciousness   Past Medical History:  He,  has a past medical history of Asthma, DVT (deep venous thrombosis) ([place]), Dyspnea, GERD (gastroesophageal reflux disease), History of kidney stones, Hypertension, 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   Surgical History:   Past Surgical History:  Procedure Laterality Date   BRONCHIAL NEEDLE ASPIRATION BIOPSY  11/01/2020   Procedure: BRONCHIAL NEEDLE ASPIRATION BIOPSIES;  Surgeon: Rigoberto Noel, MD;  Location: [place];  Service: Cardiopulmonary;;   BRONCHIAL WASHINGS  11/01/2020   Procedure: BRONCHIAL WASHINGS;  Surgeon: Rigoberto Noel, MD;  Location: [employer];  Service: Cardiopulmonary;;   INTRAVASCULAR ULTRASOUND/IVUS N/A 05/05/2021   Procedure: Intravascular Ultrasound/IVUS;  Surgeon: Nigel Mormon, MD;  Location: [place] [facility];  Service: Cardiovascular;  Laterality: N/A;   LEFT HEART CATH AND CORONARY ANGIOGRAPHY N/A 05/05/2021   Procedure: LEFT HEART CATH AND CORONARY ANGIOGRAPHY;  Surgeon: Nigel Mormon, MD;  Location: [place] [facility];  Service: Cardiovascular;  Laterality: N/A;   NO PAST SURGERIES     VIDEO BRONCHOSCOPY N/A 11/01/2020   Procedure: VIDEO BRONCHOSCOPY WITH ENDOBRONCHIAL ULTRASOUND;  Surgeon: Rigoberto Noel, MD;  Location: [place];  Service: Cardiopulmonary;  Laterality: N/A;     Social History:   reports that he quit smoking about 23 years ago. His smoking use included cigarettes. He has a 3.00 pack-year smoking history. His smokeless tobacco use includes chew and snuff. He reports current alcohol use. He reports that he does not use drugs.   Family History:  His family history includes Cancer in his father and mother; Fibromyalgia in his maternal aunt; Hypertension in his father; Lupus in his maternal grandmother; Other in his maternal grandmother and another family member;  Pulmonary fibrosis in his mother.   Allergies No Known Allergies   Home Medications  Prior to Admission medications   Medication Sig Start Date End Date Taking? Authorizing Provider  albuterol (VENTOLIN HFA) 108 (90 Base) MCG/ACT inhaler Inhale 1-2 puffs into the lungs every 6 (six) hours as needed for wheezing or shortness of breath.   Yes [provider]  atorvastatin (LIPITOR) 20 MG tablet Take 20 mg by mouth daily.   Yes [provider]  cholecalciferol (VITAMIN D) 25 MCG (1000 UNIT) tablet Take 1,000 Units by mouth daily. 01/17/21  Yes [provider]  pantoprazole (PROTONIX) 40 MG tablet Take 40 mg by mouth daily.  Yes [provider]  valsartan-hydrochlorothiazide (DIOVAN-HCT) 160-12.5 MG tablet Take 1 tablet by mouth daily.   Yes [provider]     Critical care time: n/a   Noe Gens, MSN, APRN, NP-C, AGACNP-BC [facility] 05/06/2021, 2:19 PM   Please see [email or web address] for pager details.   From 7A-7P if no response, please call [phone] After hours, please call ELink [phone]

## 2021-05-06 NOTE — Progress Notes (Signed)
PROGRESS NOTE    Zachary Weeks  TSV:[id number] DOB: 08-26-1981 DOA: 05/04/2021 PCP: Caryl Bis, MD (Confirm with patient/family/NH records and if not entered, this HAS to be entered at [facility] point of entry. "No PCP" if truly none.)   Chief Complaint  Patient presents with   Chest Pain    Brief Narrative:  Patient 40 year old gentleman history of hypertension, GERD, class III obesity, OSA on CPAP, nonspecific mediastinal adenopathy, history of asthma, prior history of DVT presented to the ED with chest pain, shortness of breath.  Noted to have a non-STEMI.  Transferred to [facility] and assessed by cardiology and underwent cardiac catheterization 05/05/2021.  Hospital course complicated by hypoxia.  Pulmonary consulted per cardiology.   Assessment & Plan:   Principal Problem:   NSTEMI (non-ST elevated myocardial infarction) ([place]) Active Problems:   Hypoxia   Mediastinal lymphadenopathy   OSA (obstructive sleep apnea)   CKD (chronic kidney disease) stage 3, GFR 30-59 ml/min (HCC)   HTN (hypertension)   GERD (gastroesophageal reflux disease)   Hyperlipidemia   Hyperglycemia   #1 non-STEMI -Patient presented with chest pain, shortness of breath. -Cardiac enzymes elevated with troponins peaking at 4682. -Chest x-ray done on admission with no acute cardiopulmonary disease. -CT angiogram chest with diffuse nonspecific mediastinal and hilar adenopathy, minimal scarring in the right middle lobe, dependent atelectasis focal subpleural opacity in the right lower lobe likely additional atelectasis or scarring with airspace disease less favored. -2D echo pending. -Patient seen in consultation by cardiology and underwent cardiac catheterization which which showed a very large normal LM, very large LAD, proximal LAD aneurysmal area with MLA of 13 mm to, left circumflex large and normal, RCA very large, difficult to opacify distally but probably normal. 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 -Continue IV heparin till 05/07/2021 per cardiology -Continue Lipitor, Lopressor. -Per cardiology.  2.  Hypoxia -Patient noted to have sats in the 80s on room air while at rest. -Patient with history of OSA on CPAP nightly however unable to tolerate our CPAP here. -Chest x-ray negative for any acute cardiopulmonary disease. -CT angiogram chest negative for PE, stable appearance of diffuse nonspecific mediastinal and hilar adenopathy, minimal scarring in the right middle lobe, dependent atelectasis focal subpleural opacity in the right lower lobe, likely additional atelectasis or scarring with airspace disease less favored.  -BNP within normal limits. -2D echo pending. -Incentive spirometry. -Pulmonary consulted per cardiology for further evaluation and management.  3.  Hypertension -Continue metoprolol twice daily. -Cardiology following.  4.  Hyperlipidemia -Fasting lipid panel with LDL of 81 -Continue statin.  5.  Hyperglycemia -Hemoglobin A1c 6.6 -Check CBG before meals and at bedtime. -SSI. -Diet and lifestyle modification. -Outpatient follow-up with PCP.  6.  Morbid obesity/OSA -Patient noted not to be able to tolerate CPAP overnight per patient due to mask.  7.  Chronic kidney disease stage IIIa -Stable at baseline. -Avoid nephrotoxic agents. -Continue to hold Diovan and HCTZ.  8.  GERD -PPI.  9.  History of asthma -Bronchodilators as needed.  10.  Chronic mediastinal lymphadenopathy -Will need outpatient follow-up with pulmonary.    DVT prophylaxis: Heparin Code Status: Full Family Communication: Updated patient.  No family at bedside. Disposition:   Status is: Observation  The patient remains OBS appropriate and will d/c before 2 midnights.  Dispo: The  patient is  from: Home              Anticipated d/c is to: Home              Patient currently is not medically stable to d/c.   Difficult to place patient No       Consultants:  Cardiology: Dr.Patwardhan 05/05/2021 Pulmonary pending  Procedures: 2D echo 05/06/2021 Chest x-ray 05/06/2021 Cardiac catheterization 05/05/2021 per Dr. Virgina Jock  Antimicrobials:  None   Subjective: Patient sitting in recliner.  Denies any significant chest pain.  Feels shortness of breath is improved.  No abdominal pain.  States unable to tolerate CPAP machine here due to mask.  States noted to be hypoxic while sitting up but states not hypoxic when he is ambulating.  Objective: Vitals:   05/06/21 0400 05/06/21 0730 05/06/21 1056 05/06/21 1206  BP:  125/86  133/78  Pulse:  82  78  Resp:  20  18  Temp: 98.4 F (36.9 C) 98.7 F (37.1 C)  98.7 F (37.1 C)  TempSrc: Oral Oral  Oral  SpO2: 91% 90%  92%  Weight:   (!) 146.3 kg   Height:        Intake/Output Summary (Last 24 hours) at 05/06/2021 1629 Last data filed at 05/06/2021 1525 Gross per 24 hour  Intake 1063.69 ml  Output 4350 ml  Net -3286.31 ml   Filed Weights   05/04/21 2353 05/06/21 1056  Weight: (!) 145.2 kg (!) 146.3 kg    Examination:  General exam: Appears calm and comfortable.  Respiratory system: Clear to auscultation.  No wheezes, no crackles, no rhonchi.  Speaking in full sentences.  Respiratory effort normal. Cardiovascular system: S1 & S2 heard, RRR. No JVD, murmurs, rubs, gallops or clicks. No pedal edema. Gastrointestinal system: Abdomen is obese, nondistended, soft and nontender. No organomegaly or masses felt. Normal bowel sounds heard. Central nervous system: Alert and oriented. No focal neurological deficits. Extremities: Symmetric 5 x 5 power. Skin: No rashes, lesions or ulcers Psychiatry: Judgement and insight appear normal. Mood & affect appropriate.     Data Reviewed: I have personally reviewed following labs and  imaging studies  CBC: Recent Labs  Lab 05/05/21 0011 05/06/21 0021  WBC 9.0 8.4  HGB 16.6 16.1  HCT 50.7 49.9  MCV 85.6 84.9  PLT 190 465    Basic Metabolic Panel: Recent Labs  Lab 05/05/21 0011 05/05/21 0915 05/06/21 0021 05/06/21 0804  NA 133* 134* 132*  --   K 3.6 4.1 5.3* 4.2  CL 100 101 98  --   CO2 24 24 25   --   GLUCOSE 234* 141* 130*  --   BUN 18 16 16   --   CREATININE 1.68* 1.40* 1.46*  --   CALCIUM 8.9 8.5* 8.7*  --     GFR: Estimated Creatinine Clearance: 98 mL/min (A) (by C-G formula based on SCr of 1.46 mg/dL (H)).  Liver Function Tests: No results for input(s): AST, ALT, ALKPHOS, BILITOT, PROT, ALBUMIN in the last 168 hours.  CBG: Recent Labs  Lab 05/06/21 1613  GLUCAP 116*     Recent Results (from the past 240 hour(s))  Resp Panel by RT-PCR (Flu A&B, Covid) Nasopharyngeal Swab     Status: None   Collection Time: 05/05/21  7:45 AM   Specimen: Nasopharyngeal Swab; Nasopharyngeal(NP) swabs in vial transport medium  Result Value Ref Range Status   SARS Coronavirus 2 by RT PCR NEGATIVE NEGATIVE Final    Comment: (  NOTE) SARS-CoV-2 target nucleic acids are NOT DETECTED.  The SARS-CoV-2 RNA is generally detectable in upper respiratory specimens during the acute phase of infection. The lowest concentration of SARS-CoV-2 viral copies this assay can detect is 138 copies/mL. A negative result does not preclude SARS-Cov-2 infection and should not be used as the sole basis for treatment or other patient management decisions. A negative result may occur with  improper specimen collection/handling, submission of specimen other than nasopharyngeal swab, presence of viral mutation(s) within the areas targeted by this assay, and inadequate number of viral copies(<138 copies/mL). A negative result must be combined with clinical observations, patient history, and epidemiological information. The expected result is Negative.  Fact Sheet for Patients:   EntrepreneurPulse.com.au  Fact Sheet for Healthcare Providers:  IncredibleEmployment.be  This test is no t yet approved or cleared by the [place] FDA and  has been authorized for detection and/or diagnosis of SARS-CoV-2 by FDA under an Emergency Use Authorization (EUA). This EUA will remain  in effect (meaning this test can be used) for the duration of the COVID-19 declaration under Section 564(b)(1) of the Act, 21 U.S.C.section 360bbb-3(b)(1), unless the authorization is terminated  or revoked sooner.       Influenza A by PCR NEGATIVE NEGATIVE Final   Influenza B by PCR NEGATIVE NEGATIVE Final    Comment: (NOTE) The Xpert Xpress SARS-CoV-2/FLU/RSV plus assay is intended as an aid in the diagnosis of influenza from Nasopharyngeal swab specimens and should not be used as a sole basis for treatment. Nasal washings and aspirates are unacceptable for Xpert Xpress SARS-CoV-2/FLU/RSV testing.  Fact Sheet for Patients: EntrepreneurPulse.com.au  Fact Sheet for Healthcare Providers: IncredibleEmployment.be  This test is not yet approved or cleared by the [place] FDA and has been authorized for detection and/or diagnosis of SARS-CoV-2 by FDA under an Emergency Use Authorization (EUA). This EUA will remain in effect (meaning this test can be used) for the duration of the COVID-19 declaration under Section 564(b)(1) of the Act, 21 U.S.C. section 360bbb-3(b)(1), unless the authorization is terminated or revoked.  Performed at [facility], [address], [place], [place] [address]          Radiology Studies: DG Chest 2 View  Result Date: 05/05/2021 CLINICAL DATA:  Chest pain EXAM: CHEST - 2 VIEW COMPARISON:  11/07/2020 FINDINGS: Heart and mediastinal contours are within normal limits. No focal opacities or effusions. No acute bony abnormality. IMPRESSION: No active cardiopulmonary disease.  Electronically Signed   By: Rolm Baptise M.D.   On: 05/05/2021 00:38   CT Angio Chest PE W and/or Wo Contrast  Result Date: 05/05/2021 CLINICAL DATA:  Intermittent chest pain for 3 days with shortness of breath, hypoxic at home. History of asthma his and EXAM: CT ANGIOGRAPHY CHEST CT ABDOMEN AND PELVIS WITH CONTRAST TECHNIQUE: Multidetector CT imaging of the chest was performed using the standard protocol during bolus administration of intravenous contrast. Multiplanar CT image reconstructions and MIPs were obtained to evaluate the vascular anatomy. Multidetector CT imaging of the abdomen and pelvis was performed using the standard protocol during bolus administration of intravenous contrast. CONTRAST:  3mL OMNIPAQUE IOHEXOL 350 MG/ML SOLN COMPARISON:  PET-CT 04/24/2020 (images only) renal ultrasound 12/27/2020, CT 02/19/2013, CT chest 08/23/2020 FINDINGS: CTA CHEST FINDINGS Cardiovascular: Satisfactory opacification the pulmonary arteries to the segmental level. No pulmonary artery filling defects are identified accounting for some mild motion artifact. Central pulmonary arteries are normal caliber. Normal heart size. No pericardial effusion. The aortic root is suboptimally  assessed given cardiac pulsation artifact. The aorta is normal caliber. No acute luminal abnormality of the imaged aorta. No periaortic stranding or hemorrhage. Shared origin of the brachiocephalic and left common carotid arteries. Proximal great vessels are otherwise unremarkable and free of acute abnormality. No major venous abnormalities nor significant venous reflux. Mediastinum/Nodes: Multiple enlarged mediastinal and hilar nodes are present. Index nodes include: Stable 22 mm subcarinal nodal deposit, difficult to the distinguish from the margins of the esophagus (6/155). Stable 15 mm AP window lymph node (6/114). Stable 17 mm right hilar node (6/148). Stable 12 mm left hilar node (6/157). No other acute abnormality of the esophagus.  Trachea is unremarkable. Thyroid gland and thoracic inlet is free of acute abnormality. No concerning axillary adenopathy. Lungs/Pleura: Dependent atelectatic changes are present. Some mild subpleural reticular change, likely chronic scarring, present in the right middle lobe (6/213). Additional scarring, atelectasis versus less likely airspace disease in the dependent right lower lobe (6/190). No other focal airspace opacity. No pneumothorax or visible effusion. Airways are patent Musculoskeletal: No acute osseous abnormality or suspicious osseous lesion. Worrisome chest wall mass or lesion. CT ABDOMEN and PELVIS FINDINGS Hepatobiliary: No worrisome focal liver lesions. Smooth liver surface contour. Normal hepatic attenuation. Gallbladder largely decompressed accounting for some mild gallbladder wall thickening. No visible calcified gallstones. No biliary ductal dilatation. Pancreas: No pancreatic ductal dilatation or surrounding inflammatory changes. Spleen: Top normal splenic size. No concerning focal splenic lesion. Adrenals/Urinary Tract: Normal adrenal glands. Kidneys are normally located with symmetric enhancement and excretion. Slightly diminutive right kidney may reflect some asymmetric atrophy. No suspicious renal lesion. Few scattered punctate nonobstructing calculi are present both kidneys, largest measuring up to 2 mm in the left kidney (3/41). No obstructive urolithiasis or hydronephrosis. Urinary bladder is unremarkable. Stomach/Bowel: Distal esophagus, stomach and duodenal sweep are unremarkable. No small bowel wall thickening or dilatation. No evidence of obstruction. A normal appendix is visualized. No colonic dilatation or wall thickening. Moderate colonic stool burden. Vascular/Lymphatic: No significant vascular findings are present. No enlarged abdominal or pelvic lymph nodes. Reproductive: Coarse eccentric calcification of the prostate, typically benign. No concerning abnormalities of the  prostate or seminal vesicles. Other: No abdominopelvic free fluid or free gas. No bowel containing hernias. Musculoskeletal: No acute osseous abnormality or suspicious osseous lesion. Mild degenerative changes most pronounced at L5-S1. Additional mild degenerative features in the bilateral hips. Review of the MIP images confirms the above findings. IMPRESSION: 1. Stable appearance of diffuse nonspecific mediastinal and hilar adenopathy. 2. Minimal scarring in the right middle lobe. 3. Dependent atelectasis focal subpleural opacity in the right lower lobe, likely additional atelectasis or scarring with airspace disease less favored. 4. No acute abdominopelvic abnormality. 5. Asymmetric atrophy of the right kidney. 6. Bilateral nonobstructing calculi without obstructive urolithiasis or hydronephrosis. These results were called by telephone at the time of interpretation on 05/05/2021 at 3:01 am to provider [facility] , who verbally acknowledged these results. Electronically Signed   By: Lovena Le M.D.   On: 05/05/2021 03:01   CT ABDOMEN PELVIS W CONTRAST  Result Date: 05/05/2021 CLINICAL DATA:  Intermittent chest pain for 3 days with shortness of breath, hypoxic at home. History of asthma his and EXAM: CT ANGIOGRAPHY CHEST CT ABDOMEN AND PELVIS WITH CONTRAST TECHNIQUE: Multidetector CT imaging of the chest was performed using the standard protocol during bolus administration of intravenous contrast. Multiplanar CT image reconstructions and MIPs were obtained to evaluate the vascular anatomy. Multidetector CT imaging of the abdomen and pelvis was  performed using the standard protocol during bolus administration of intravenous contrast. CONTRAST:  41mL OMNIPAQUE IOHEXOL 350 MG/ML SOLN COMPARISON:  PET-CT 04/24/2020 (images only) renal ultrasound 12/27/2020, CT 02/19/2013, CT chest 08/23/2020 FINDINGS: CTA CHEST FINDINGS Cardiovascular: Satisfactory opacification the pulmonary arteries to the segmental level. No  pulmonary artery filling defects are identified accounting for some mild motion artifact. Central pulmonary arteries are normal caliber. Normal heart size. No pericardial effusion. The aortic root is suboptimally assessed given cardiac pulsation artifact. The aorta is normal caliber. No acute luminal abnormality of the imaged aorta. No periaortic stranding or hemorrhage. Shared origin of the brachiocephalic and left common carotid arteries. Proximal great vessels are otherwise unremarkable and free of acute abnormality. No major venous abnormalities nor significant venous reflux. Mediastinum/Nodes: Multiple enlarged mediastinal and hilar nodes are present. Index nodes include: Stable 22 mm subcarinal nodal deposit, difficult to the distinguish from the margins of the esophagus (6/155). Stable 15 mm AP window lymph node (6/114). Stable 17 mm right hilar node (6/148). Stable 12 mm left hilar node (6/157). No other acute abnormality of the esophagus. Trachea is unremarkable. Thyroid gland and thoracic inlet is free of acute abnormality. No concerning axillary adenopathy. Lungs/Pleura: Dependent atelectatic changes are present. Some mild subpleural reticular change, likely chronic scarring, present in the right middle lobe (6/213). Additional scarring, atelectasis versus less likely airspace disease in the dependent right lower lobe (6/190). No other focal airspace opacity. No pneumothorax or visible effusion. Airways are patent Musculoskeletal: No acute osseous abnormality or suspicious osseous lesion. Worrisome chest wall mass or lesion. CT ABDOMEN and PELVIS FINDINGS Hepatobiliary: No worrisome focal liver lesions. Smooth liver surface contour. Normal hepatic attenuation. Gallbladder largely decompressed accounting for some mild gallbladder wall thickening. No visible calcified gallstones. No biliary ductal dilatation. Pancreas: No pancreatic ductal dilatation or surrounding inflammatory changes. Spleen: Top normal  splenic size. No concerning focal splenic lesion. Adrenals/Urinary Tract: Normal adrenal glands. Kidneys are normally located with symmetric enhancement and excretion. Slightly diminutive right kidney may reflect some asymmetric atrophy. No suspicious renal lesion. Few scattered punctate nonobstructing calculi are present both kidneys, largest measuring up to 2 mm in the left kidney (3/41). No obstructive urolithiasis or hydronephrosis. Urinary bladder is unremarkable. Stomach/Bowel: Distal esophagus, stomach and duodenal sweep are unremarkable. No small bowel wall thickening or dilatation. No evidence of obstruction. A normal appendix is visualized. No colonic dilatation or wall thickening. Moderate colonic stool burden. Vascular/Lymphatic: No significant vascular findings are present. No enlarged abdominal or pelvic lymph nodes. Reproductive: Coarse eccentric calcification of the prostate, typically benign. No concerning abnormalities of the prostate or seminal vesicles. Other: No abdominopelvic free fluid or free gas. No bowel containing hernias. Musculoskeletal: No acute osseous abnormality or suspicious osseous lesion. Mild degenerative changes most pronounced at L5-S1. Additional mild degenerative features in the bilateral hips. Review of the MIP images confirms the above findings. IMPRESSION: 1. Stable appearance of diffuse nonspecific mediastinal and hilar adenopathy. 2. Minimal scarring in the right middle lobe. 3. Dependent atelectasis focal subpleural opacity in the right lower lobe, likely additional atelectasis or scarring with airspace disease less favored. 4. No acute abdominopelvic abnormality. 5. Asymmetric atrophy of the right kidney. 6. Bilateral nonobstructing calculi without obstructive urolithiasis or hydronephrosis. These results were called by telephone at the time of interpretation on 05/05/2021 at 3:01 am to provider [facility] , who verbally acknowledged these results. Electronically  Signed   By: Lovena Le M.D.   On: 05/05/2021 03:01   CARDIAC CATHETERIZATION  Result Date: 05/05/2021 Images from the original result were not included. LM:  Very large. Normal LAD: Very large. 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 Normal RCA: Very large. Difficult to opacify distally, but probably normal. 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 No treatable lesion identified. Recommend DAPT with Aspirin and brilinta. Recommend Saline 75 cc for 8 hrs, along with IV lasix 40 mg to maintain urine output and reduce CIN risk Nigel Mormon, MD Pager: [phone] Office: [phone]   DG CHEST PORT 1 VIEW  Result Date: 05/06/2021 CLINICAL DATA:  Shortness of breath, asthma EXAM: PORTABLE CHEST 1 VIEW COMPARISON:  Portable exam 0839 hours compared to 05/05/2021 FINDINGS: Normal heart size, mediastinal contours, and pulmonary vascularity. Lungs clear. No infiltrate, pleural effusion, or pneumothorax. Osseous structures unremarkable. IMPRESSION: No acute abnormalities. Electronically Signed   By: Lavonia Dana M.D.   On: 05/06/2021 08:45        Scheduled Meds:  aspirin EC  81 mg Oral Daily   atorvastatin  40 mg Oral Daily   insulin aspart  0-9 Units Subcutaneous TID WC   metoprolol tartrate  25 mg Oral BID   mometasone-formoterol  2 puff Inhalation BID   pantoprazole  40 mg Oral Daily   sodium chloride flush  3 mL Intravenous Q12H   ticagrelor  90 mg Oral BID   Continuous Infusions:  sodium chloride     heparin 2,000 Units/hr (05/06/21 1525)     LOS: 0 days    Time spent: 40 minutes    Irine Seal, MD [facility]   To contact the attending provider between 7A-7P or the covering provider during after hours 7P-7A, please log into the web site [email or web address] and access using universal [place] password for that web site. If you do not have the password, please call the hospital operator.  05/06/2021,  4:29 PM

## 2021-05-06 NOTE — Progress Notes (Signed)
Echocardiogram 2D Echocardiogram has been performed.  [name] 05/06/2021, 1:55 PM

## 2021-05-06 NOTE — Progress Notes (Signed)
Subjective:  No chest pain Still has shortness of breath  Episodes of hypoxia O2 sats in mid 80s while awake/asleep  Objective:  Vital Signs in the last 24 hours: Temp:  [97.6 F (36.4 C)-98.7 F (37.1 C)] 98.7 F (37.1 C) (06/14 1206) Pulse Rate:  [62-84] 78 (06/14 1206) Resp:  [8-28] 18 (06/14 1206) BP: (112-147)/(61-91) 133/78 (06/14 1206) SpO2:  [90 %-98 %] 92 % (06/14 1206) Weight:  [146.3 kg] (P) 146.3 kg (06/14 1056)  Intake/Output from previous day: 06/13 0701 - 06/14 0700 In: 343.7 [I.V.:343.7] Out: 2150 [Urine:2150]  Physical Exam Vitals and nursing note reviewed.  Constitutional:      General: He is not in acute distress.    Appearance: He is well-developed.  HENT:     Head: Normocephalic and atraumatic.  Eyes:     Conjunctiva/sclera: Conjunctivae normal.     Pupils: Pupils are equal, round, and reactive to light.  Neck:     Vascular: No JVD.  Cardiovascular:     Rate and Rhythm: Normal rate and regular rhythm.     Pulses: Normal pulses and intact distal pulses.     Heart sounds: No murmur heard. Pulmonary:     Effort: Pulmonary effort is normal.     Breath sounds: Normal breath sounds. No wheezing or rales.  Abdominal:     General: Bowel sounds are normal.     Palpations: Abdomen is soft.     Tenderness: There is no rebound.  Musculoskeletal:        General: No tenderness. Normal range of motion.     Right lower leg: No edema.     Left lower leg: No edema.  Lymphadenopathy:     Cervical: No cervical adenopathy.  Skin:    General: Skin is warm and dry.  Neurological:     Mental Status: He is alert and oriented to person, place, and time.     Cranial Nerves: No cranial nerve deficit.     Lab Results: BMP Recent Labs    05/05/21 0011 05/05/21 0915 05/06/21 0021 05/06/21 0804  NA 133* 134* 132*  --   K 3.6 4.1 5.3* 4.2  CL 100 101 98  --   CO2 24 24 25   --   GLUCOSE 234* 141* 130*  --   BUN 18 16 16   --   CREATININE 1.68* 1.40* 1.46*   --   CALCIUM 8.9 8.5* 8.7*  --   GFRNONAA 52* >60 >60  --     CBC Recent Labs  Lab 05/06/21 0021  WBC 8.4  RBC 5.88*  HGB 16.1  HCT 49.9  PLT 198  MCV 84.9  MCH 27.4  MCHC 32.3  RDW 13.6    HEMOGLOBIN A1C Lab Results  Component Value Date   HGBA1C 6.6 (H) 05/05/2021   MPG 143 05/05/2021    Cardiac Panel (last 3 results) No results for input(s): CKTOTAL, CKMB, TROPONINI, RELINDX in the last 8760 hours.  BNP (last 3 results) Recent Labs    05/05/21 0011 05/06/21 0929  BNP 44.0 28.3    TSH Recent Labs    05/05/21 0011  TSH 3.716    Lipid Panel     Component Value Date/Time   CHOL 158 05/05/2021 0727   TRIG 227 (H) 05/05/2021 0727   HDL 32 (L) 05/05/2021 0727   CHOLHDL 4.9 05/05/2021 0727   VLDL 45 (H) 05/05/2021 0727   LDLCALC 81 05/05/2021 0727     Imaging: CTA chest 05/05/2021: 1. Stable  appearance of diffuse nonspecific mediastinal and hilar adenopathy. 2. Minimal scarring in the right middle lobe. 3. Dependent atelectasis focal subpleural opacity in the right lower lobe, likely additional atelectasis or scarring with airspace disease less favored. 4. No acute abdominopelvic abnormality. 5. Asymmetric atrophy of the right kidney. 6. Bilateral nonobstructing calculi without obstructive urolithiasis or hydronephrosis.    Cardiac Studies:   Echocardiogram 05/06/2021: Preliminary: Normal EF, normal diastolic function, no significant valvular abnormality Contrast images pending  LM:  Very large. Normal LAD: Very large. 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 Normal RCA: Very large. Difficult to opacify distally, but probably normal.   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 No treatable lesion identified. Recommend DAPT with Aspirin and brilinta.   EKG 05/04/2021: Sinus tachycardia 114 bpm No acute ischemic changes  Assessment & Recommendations:   40 y.o. Caucasian male   with hypertension, asthma, OSA on CPAP, stable nonspecific hilar and mediastinal adenopathy followed by pulmonology, h/o DVT, now admitted with chest pain, NSTEMI   NSTEMI: Peak Trop HS 4600.  Currently chest pain-free. Nonobstructive CAD. Recommend DAPT for 1 year, statin, Continue IV heparin till 6/15 AM.  Hilar and mediastinal adenopathy: Nonspecific, stable Continues to have episodes of hypoxia while awake and asleep Will get pulmonology consult  H/o DVT: In 2020 treated with ? 6 months of anticoagulation He has 3 family members on maternal side with with h/o lupus anticoagulant May need outpatient hematology consult   Nigel Mormon, MD Pager: [phone] Office: [phone]

## 2021-05-06 NOTE — Progress Notes (Signed)
PT refused Dulera inhaler. He states steroid inhalers make his breathing worse, and that he does not use them at home. I advised him to mention it to the MD in the morning.

## 2021-05-06 NOTE — Discharge Instructions (Signed)

## 2021-05-06 NOTE — Progress Notes (Addendum)
CARDIAC REHAB PHASE I   PRE:  Rate/Rhythm: 96 SR    BP: sitting 119/70    SaO2: 91 3L, 89 RA  MODE:  Ambulation: 680 ft   POST:  Rate/Rhythm: 102 ST    BP: sitting 116/84     SaO2: 91 RA  Pt ambulated long distance on RA, SaO2 monitored entire walk, maintained 89-92 RA. Tolerated walk well, HR controlled, no significant SOB. Pt to recliner for education, left off O2. Pt SaO2 lower in recliner 87-89 RA and pt became hypersomalant. Pt felt it was due to low SaO2 therefore I applied 2L O2. However pt SaO2 continued at 87-91 on 2L, pt still falling asleep during education. Turned to 3L when I left room.  Discussed MI, restrictions, Brilinta, diet, exercise, tobacco cessation, and CRPII. Pt receptive. He has been working on his diet recently and has lost some weight. Encouraged him to increase exercise. He has a pulse ox at home. Interested in [facility] and will refer to [place]. Pt aware of his A1C, encouraged consistent f/u with PCP.  McIntyre, ACSM 05/06/2021 9:39 AM  Delayed entry: I did give pt an IS. Able to perform 2500+ ml with appropriate mechanics.

## 2021-05-06 NOTE — Progress Notes (Signed)
ABG obtained and sent to lab. °

## 2021-05-07 ENCOUNTER — Ambulatory Visit (HOSPITAL_COMMUNITY): Payer: Managed Care, Other (non HMO)

## 2021-05-07 ENCOUNTER — Other Ambulatory Visit (HOSPITAL_COMMUNITY): Payer: Self-pay

## 2021-05-07 ENCOUNTER — Encounter: Payer: Self-pay | Admitting: Cardiology

## 2021-05-07 DIAGNOSIS — K219 Gastro-esophageal reflux disease without esophagitis: Secondary | ICD-10-CM | POA: Diagnosis present

## 2021-05-07 DIAGNOSIS — J45909 Unspecified asthma, uncomplicated: Secondary | ICD-10-CM | POA: Diagnosis present

## 2021-05-07 DIAGNOSIS — N1831 Chronic kidney disease, stage 3a: Secondary | ICD-10-CM | POA: Diagnosis present

## 2021-05-07 DIAGNOSIS — Z86718 Personal history of other venous thrombosis and embolism: Secondary | ICD-10-CM | POA: Diagnosis not present

## 2021-05-07 DIAGNOSIS — Z832 Family history of diseases of the blood and blood-forming organs and certain disorders involving the immune mechanism: Secondary | ICD-10-CM | POA: Diagnosis not present

## 2021-05-07 DIAGNOSIS — Z87442 Personal history of urinary calculi: Secondary | ICD-10-CM | POA: Diagnosis not present

## 2021-05-07 DIAGNOSIS — Z720 Tobacco use: Secondary | ICD-10-CM | POA: Diagnosis not present

## 2021-05-07 DIAGNOSIS — Z8249 Family history of ischemic heart disease and other diseases of the circulatory system: Secondary | ICD-10-CM | POA: Diagnosis not present

## 2021-05-07 DIAGNOSIS — Z6841 Body Mass Index (BMI) 40.0 and over, adult: Secondary | ICD-10-CM | POA: Diagnosis not present

## 2021-05-07 DIAGNOSIS — Z809 Family history of malignant neoplasm, unspecified: Secondary | ICD-10-CM | POA: Diagnosis not present

## 2021-05-07 DIAGNOSIS — I129 Hypertensive chronic kidney disease with stage 1 through stage 4 chronic kidney disease, or unspecified chronic kidney disease: Secondary | ICD-10-CM | POA: Diagnosis present

## 2021-05-07 DIAGNOSIS — J9811 Atelectasis: Secondary | ICD-10-CM | POA: Diagnosis present

## 2021-05-07 DIAGNOSIS — J9601 Acute respiratory failure with hypoxia: Secondary | ICD-10-CM | POA: Diagnosis present

## 2021-05-07 DIAGNOSIS — Z20822 Contact with and (suspected) exposure to covid-19: Secondary | ICD-10-CM | POA: Diagnosis present

## 2021-05-07 DIAGNOSIS — R739 Hyperglycemia, unspecified: Secondary | ICD-10-CM | POA: Diagnosis present

## 2021-05-07 DIAGNOSIS — G4733 Obstructive sleep apnea (adult) (pediatric): Secondary | ICD-10-CM | POA: Diagnosis present

## 2021-05-07 DIAGNOSIS — Z79899 Other long term (current) drug therapy: Secondary | ICD-10-CM | POA: Diagnosis not present

## 2021-05-07 DIAGNOSIS — E785 Hyperlipidemia, unspecified: Secondary | ICD-10-CM | POA: Diagnosis present

## 2021-05-07 DIAGNOSIS — R590 Localized enlarged lymph nodes: Secondary | ICD-10-CM | POA: Diagnosis present

## 2021-05-07 DIAGNOSIS — I214 Non-ST elevation (NSTEMI) myocardial infarction: Secondary | ICD-10-CM | POA: Diagnosis present

## 2021-05-07 DIAGNOSIS — I2510 Atherosclerotic heart disease of native coronary artery without angina pectoris: Secondary | ICD-10-CM | POA: Diagnosis present

## 2021-05-07 LAB — DIFFERENTIAL
Abs Immature Granulocytes: 0.07 10*3/uL (ref 0.00–0.07)
Basophils Absolute: 0 10*3/uL (ref 0.0–0.1)
Basophils Relative: 0 %
Eosinophils Absolute: 0.2 10*3/uL (ref 0.0–0.5)
Eosinophils Relative: 2 %
Immature Granulocytes: 1 %
Lymphocytes Relative: 24 %
Lymphs Abs: 2.2 10*3/uL (ref 0.7–4.0)
Monocytes Absolute: 0.8 10*3/uL (ref 0.1–1.0)
Monocytes Relative: 9 %
Neutro Abs: 5.9 10*3/uL (ref 1.7–7.7)
Neutrophils Relative %: 64 %

## 2021-05-07 LAB — BASIC METABOLIC PANEL
Anion gap: 13 (ref 5–15)
BUN: 20 mg/dL (ref 6–20)
CO2: 27 mmol/L (ref 22–32)
Calcium: 9.4 mg/dL (ref 8.9–10.3)
Chloride: 97 mmol/L — ABNORMAL LOW (ref 98–111)
Creatinine, Ser: 1.49 mg/dL — ABNORMAL HIGH (ref 0.61–1.24)
GFR, Estimated: 60 mL/min — ABNORMAL LOW (ref >60)
Glucose, Bld: 128 mg/dL — ABNORMAL HIGH (ref 70–99)
Potassium: 3.9 mmol/L (ref 3.5–5.1)
Sodium: 137 mmol/L (ref 135–145)

## 2021-05-07 LAB — CBC
HCT: 52.8 % — ABNORMAL HIGH (ref 39.0–52.0)
Hemoglobin: 17 g/dL (ref 13.0–17.0)
MCH: 28.3 pg (ref 26.0–34.0)
MCHC: 32.2 g/dL (ref 30.0–36.0)
MCV: 87.9 fL (ref 80.0–100.0)
Platelets: 178 10*3/uL (ref 150–400)
RBC: 6.01 MIL/uL — ABNORMAL HIGH (ref 4.22–5.81)
RDW: 13.7 % (ref 11.5–15.5)
WBC: 9.1 10*3/uL (ref 4.0–10.5)
nRBC: 0 % (ref 0.0–0.2)

## 2021-05-07 LAB — GLUCOSE, CAPILLARY
Glucose-Capillary: 132 mg/dL — ABNORMAL HIGH (ref 70–99)
Glucose-Capillary: 122 mg/dL — ABNORMAL HIGH (ref 70–99)

## 2021-05-07 LAB — MAGNESIUM: Magnesium: 2.2 mg/dL (ref 1.7–2.4)

## 2021-05-07 MED ORDER — NITROGLYCERIN 0.4 MG SL SUBL
0.4000 mg | SUBLINGUAL_TABLET | SUBLINGUAL | 1 refills | Status: DC | PRN
  Filled 2021-05-07: qty 25, 7d supply, fill #0

## 2021-05-07 MED ORDER — ATORVASTATIN CALCIUM 40 MG PO TABS
40.0000 mg | ORAL_TABLET | Freq: Every day | ORAL | 1 refills | Status: DC
  Filled 2021-05-07: qty 30, 30d supply, fill #0

## 2021-05-07 MED ORDER — TICAGRELOR 90 MG PO TABS
90.0000 mg | ORAL_TABLET | Freq: Two times a day (BID) | ORAL | 1 refills | Status: DC
Start: 2021-05-07 — End: 2021-07-03
  Filled 2021-05-07: qty 60, 30d supply, fill #0

## 2021-05-07 MED ORDER — METOPROLOL TARTRATE 25 MG PO TABS
25.0000 mg | ORAL_TABLET | Freq: Two times a day (BID) | ORAL | 1 refills | Status: DC
  Filled 2021-05-07: qty 60, 30d supply, fill #0

## 2021-05-07 MED ORDER — ASPIRIN 81 MG PO TBEC
81.0000 mg | DELAYED_RELEASE_TABLET | Freq: Every day | ORAL | 1 refills | Status: DC
  Filled 2021-05-07: qty 30, 30d supply, fill #0

## 2021-05-07 NOTE — Progress Notes (Signed)
Checked with patient again to verify he did not want to wear CPAP.  Patient states he don't want to wear it.

## 2021-05-07 NOTE — Progress Notes (Signed)
Patient provided with detailed discharge instructions. RN answered all questions. IV's removed, VSS at d/c. TOC brought prescriptions to beside.  Pt belongings sent with patient. Pt discharged via wheelchair to [employer] entrance to private vehicle for discharge.

## 2021-05-07 NOTE — Progress Notes (Signed)
Subjective:  No chest pain Breathing improved Has episodes of hypoxia in 80s mostly during sleep time  Objective:  Vital Signs in the last 24 hours: Temp:  [98 F (36.7 C)-98.7 F (37.1 C)] 98 F (36.7 C) (06/15 0401) Pulse Rate:  [78-85] 81 (06/15 0401) Resp:  [16-18] 16 (06/15 0401) BP: (115-149)/(65-84) 115/65 (06/15 0401) SpO2:  [90 %-93 %] 91 % (06/15 0401) Weight:  [146.3 kg-146.9 kg] 146.9 kg (06/15 0500)  Intake/Output from previous day: 06/14 0701 - 06/15 0700 In: 720 [P.O.:720] Out: 3125 [Urine:3125]  Physical Exam Vitals and nursing note reviewed.  Constitutional:      General: He is not in acute distress.    Appearance: He is well-developed.  HENT:     Head: Normocephalic and atraumatic.  Eyes:     Conjunctiva/sclera: Conjunctivae normal.     Pupils: Pupils are equal, round, and reactive to light.  Neck:     Vascular: No JVD.  Cardiovascular:     Rate and Rhythm: Normal rate and regular rhythm.     Pulses: Normal pulses and intact distal pulses.     Heart sounds: No murmur heard. Pulmonary:     Effort: Pulmonary effort is normal.     Breath sounds: Normal breath sounds. No wheezing or rales.  Abdominal:     General: Bowel sounds are normal.     Palpations: Abdomen is soft.     Tenderness: There is no rebound.  Musculoskeletal:        General: No tenderness. Normal range of motion.     Right lower leg: No edema.     Left lower leg: No edema.  Lymphadenopathy:     Cervical: No cervical adenopathy.  Skin:    General: Skin is warm and dry.  Neurological:     Mental Status: He is alert and oriented to person, place, and time.     Cranial Nerves: No cranial nerve deficit.     Lab Results: BMP Recent Labs    05/05/21 0915 05/06/21 0021 05/06/21 0804 05/07/21 0148  NA 134* 132*  --  137  K 4.1 5.3* 4.2 3.9  CL 101 98  --  97*  CO2 24 25  --  27  GLUCOSE 141* 130*  --  128*  BUN 16 16  --  20  CREATININE 1.40* 1.46*  --  1.49*  CALCIUM 8.5*  8.7*  --  9.4  GFRNONAA >60 >60  --  60*     CBC Recent Labs  Lab 05/07/21 0148  WBC 9.1  RBC 6.01*  HGB 17.0  HCT 52.8*  PLT 178  MCV 87.9  MCH 28.3  MCHC 32.2  RDW 13.7  LYMPHSABS 2.2  MONOABS 0.8  EOSABS 0.2  BASOSABS 0.0     HEMOGLOBIN A1C Lab Results  Component Value Date   HGBA1C 6.6 (H) 05/05/2021   MPG 143 05/05/2021    Cardiac Panel (last 3 results) No results for input(s): CKTOTAL, CKMB, TROPONINI, RELINDX in the last 8760 hours.  BNP (last 3 results) Recent Labs    05/05/21 0011 05/06/21 0929  BNP 44.0 28.3     TSH Recent Labs    05/05/21 0011  TSH 3.716     Lipid Panel     Component Value Date/Time   CHOL 158 05/05/2021 0727   TRIG 227 (H) 05/05/2021 0727   HDL 32 (L) 05/05/2021 0727   CHOLHDL 4.9 05/05/2021 0727   VLDL 45 (H) 05/05/2021 0727   LDLCALC 81  05/05/2021 0727     Imaging: CTA chest 05/05/2021: 1. Stable appearance of diffuse nonspecific mediastinal and hilar adenopathy. 2. Minimal scarring in the right middle lobe. 3. Dependent atelectasis focal subpleural opacity in the right lower lobe, likely additional atelectasis or scarring with airspace disease less favored. 4. No acute abdominopelvic abnormality. 5. Asymmetric atrophy of the right kidney. 6. Bilateral nonobstructing calculi without obstructive urolithiasis or hydronephrosis.    Cardiac Studies:  Echocardiogram 05/06/2021:  1. Left ventricular ejection fraction, by estimation, is 60 to 65%. The  left ventricle has normal function. The left ventricle has no regional  wall motion abnormalities. There is moderate asymmetric left ventricular  hypertrophy. Left ventricular  diastolic parameters were normal.   2. Right ventricular systolic function is normal. The right ventricular  size is normal.   3. No significant valvular abnormality.   4. The inferior vena cava is normal in size with greater than 50%  respiratory variability, suggesting right atrial  pressure of 3 mmHg.   Coronary angiogram 05/05/2021: LM:  Very large. Normal LAD: Very large. 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 Normal RCA: Very large. Difficult to opacify distally, but probably normal.   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 No treatable lesion identified. Recommend DAPT with Aspirin and brilinta.   EKG 05/04/2021: Sinus tachycardia 114 bpm No acute ischemic changes  Assessment & Recommendations:   40 y.o. Caucasian male  with hypertension, asthma, OSA on CPAP, stable nonspecific hilar and mediastinal adenopathy followed by pulmonology, h/o DVT, now admitted with chest pain, NSTEMI   NSTEMI: Peak Trop HS 4600.  Currently chest pain-free. Nonobstructive CAD. Recommend DAPT for 1 year, statin, metoprolol  Hilar and mediastinal adenopathy, hypoxia: Noncardiac in etiology. Appreciate pulmonary input. If needed, right heart cath can be performed outpatient.  H/o DVT: In 2020 treated with ? 6 months of anticoagulation He has 3 family members on maternal side with with h/o lupus anticoagulant May need outpatient hematology consult  Cardiology will sign off.  F/u w/me on 05/13/21 at 10:30 AM   Nigel Mormon, MD Pager: [phone] Office: [phone]

## 2021-05-07 NOTE — Consult Note (Signed)
NAME:  Zachary Weeks, MRN:  [id number], DOB:  [birth year], LOS: 0 ADMISSION DATE:  05/04/2021, CONSULTATION DATE:  6/14 REFERRING MD:  Dr. Virgina Jock, CHIEF COMPLAINT:  Chest pain  History of Present Illness:  40 y/o M who presented to [facility] on 6/12 with reports of chest pain and shortness of breath.    He is followed by Dr. Elsworth Soho for nonspecific hilar and mediastinal adenopathy.  He has hx of DVT in 2020.  He subsequently underwent hypercoagulable work up which was notable for ACE level of 64 and positive PCR-ABL.  There were initial concerns for possible AML, but ultimately thought not the source of LAN.  He underwent TBNA 10/2020 with scant lymphoid tissue, negative cytology.  He is followed by [facility] for CKD IIIa of unclear etiology.   Autoimmune Work up from [employer].   Autoimmune work-up done on February 21, 2020 showed Rheumatoid factor was negative Patient quantifiron on was negative Patient ANA was negative Patient angiotensin converting enzyme was negative Patient anti-SSA antibody and anti-SSB antibody were negative Patient ESR was 17 mm/hr Patient factor V Leiden test was negative  Patient HIV was negative on February 21, 2020 Patient hepatitis profile was negative on March 31 as well Patient hepatitis A/B surface antigen/hep B core antibody Hep C antibody for negative  Hemoglobin was 13.4    On presentation 6/12, the patient reported symptoms began 3 days prior to admit. He was found to have saturations in the 80's.  Initial work up in the ER showed an EKG with sinus tachycardia without acute ST changes.  High sensitivity troponin peaked at 4600.  CXR showed no acute process.  CTA of the chest demonstrated stable diffuse non-specific mediastinal and hilar adenopathy, dependent atelectasis and minimal scarring.  He had a sr cr of 1.68 on admit with baseline ~ 1.4 / CKD IIIa.  The patient was transferred to [facility] for further Cardiac evaluation.     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 No identifiable treatable lesion.  He was recommended DAPT with ASA and Brilinta. Post LHC, he was hypoxic requiring 4L O2.  He briefly wore CPAP overnight on 6/14 but was taken off due to mask fit.   PCCM consulted for evaluation of hypoxia.   He denies rashes near tattoos, rashes on skin.   Pertinent  Medical History  Asthma Former Smoker - quit 1999 OSA - on CPAP, home sleep test 11/2020 with severe OSA Non-specific hilar and mediastinal adenopathy - TBNA 10/2020 with scant lymphoid tissue DVT - 2020 PNA - as a child  Dyspnea GERD Renal Calculi  HTN  BMI 43.74 Family hx - lupus, clotting disorder (3 family members with hx of lupus anticoagulant), pulmonary fibrosis   Significant Hospital Events: Including procedures, antibiotic start and stop dates in addition to other pertinent events   6/12 Admit with NSTEMI 6/14 PCCM consulted for hypoxia   Interim History / Subjective:  No new complaints. No SOB. Has been off O2 while awake since he was weaned off yesterday afternoon, including walking in the hallway. When sleeping on RA dropped into 50s, back on O2. Has not been able to tolerate hospital CPAP.  Objective   Blood pressure 115/65, pulse 81, temperature 98 F (36.7 C), temperature source Oral, resp. rate 16, height 6' (1.829 m), weight (!) 146.9 kg, SpO2 91 %.        Intake/Output  Summary (Last 24 hours) at 05/07/2021 0832 Last data filed at 05/07/2021 0600 Gross per 24 hour  Intake 360 ml  Output 2325 ml  Net -1965 ml    Filed Weights   05/04/21 2353 05/06/21 1056 05/07/21 0500  Weight: (!) 145.2 kg (!) 146.3 kg (!) 146.9 kg    Examination: General: middle aged man sitting up in the chair in NAD HENT: [place]/AT, eyes anicteric Lungs: breathing comfortably on RA saturating ~90%, improved some when leaning forward taking deep breaths  during exam. Cardiovascular: S1S2, RRR Abdomen: obese, ND Extremities: warm, dry, no rashes Neuro: awake and alert, answering questions appropriately, normal speech.   Resolved Hospital Problem list     Assessment & Plan:   Acute Hypoxemic Respiratory Failure > resolved. Suspect this was related to atelectasis. ABG without hypercapnia. RML Scarring Non-Specific Hilar Adenopathy- chronic -ABG does not support OHS; still long-term modest weight loss would benefit him -reinforced the importance of compliance with CPAP any time he is asleep, including naps at home. If he cannot tolerate CPAP here, he will need oxygen when sleeping -agree with spending the day up in the chair and walking; pulmonary hygiene, IS -No additional testing needed at this time. Outpatient evaluation and surveillance of his adenopathy will be ongoing per Dr. Elsworth Soho.  Asthma; low eosinophil count currently -Dulera started while inpatient, ensure pt rinses mouth after use   OSA on CPAP  -continue nocturnal CPAP, auto titration 5-15cm H20  NSTEMI  -per Cardiology  -DAPT with ASA, Brilinta   PCCM will sign off. Please call with questions.  Best practice (right click and "Reselect all SmartList Selections" daily)  Per primary   Labs   CBC: Recent Labs  Lab 05/05/21 0011 05/06/21 0021 05/07/21 0148  WBC 9.0 8.4 9.1  NEUTROABS  --   --  5.9  HGB 16.6 16.1 17.0  HCT 50.7 49.9 52.8*  MCV 85.6 84.9 87.9  PLT 190 198 178     Basic Metabolic Panel: Recent Labs  Lab 05/05/21 0011 05/05/21 0915 05/06/21 0021 05/06/21 0804 05/07/21 0148  NA 133* 134* 132*  --  137  K 3.6 4.1 5.3* 4.2 3.9  CL 100 101 98  --  97*  CO2 '24 24 25  ' --  27  GLUCOSE 234* 141* 130*  --  128*  BUN '18 16 16  ' --  20  CREATININE 1.68* 1.40* 1.46*  --  1.49*  CALCIUM 8.9 8.5* 8.7*  --  9.4  MG  --   --   --   --  2.2     Julian Hy, DO 05/07/21 8:38 AM [facility]  For contact information, see  Amion. If no response to pager, please call PCCM consult pager. After hours, 7PM- 7AM, please call Elink.

## 2021-05-07 NOTE — Plan of Care (Signed)

## 2021-05-07 NOTE — Discharge Summary (Signed)
Physician Discharge Summary  Zachary Weeks XQJ:[id number] DOB: Jun 14, 1981 DOA: 05/04/2021  PCP: Caryl Bis, MD  Admit date: 05/04/2021 Discharge date: 05/07/2021  Admitted From: home Disposition:  home  Recommendations for Outpatient Follow-up:  Follow up with PCP in 1-2 weeks Please obtain BMP/CBC in one week Follow up with cardiology Consider hematology referral give hx of DVT and family hx of lupus anticoagulant. Consider right heart cath as outpatient if further issues with hypoxia which has fully resolved at time of d/c.  Home Health: No  Equipment/Devices: None   Discharge Condition: Stable  CODE STATUS: Full  Diet recommendation: Heart Healthy    Discharge Diagnoses: Principal Problem:   NSTEMI (non-ST elevated myocardial infarction) ([place]) Active Problems:   Mediastinal lymphadenopathy   OSA (obstructive sleep apnea)   CKD (chronic kidney disease) stage 3, GFR 30-59 ml/min (HCC)   HTN (hypertension)   Hypoxia   GERD (gastroesophageal reflux disease)   Hyperlipidemia   Hyperglycemia    Summary of HPI and Hospital Course:   "Patient 40 year old gentleman history of hypertension, GERD, class III obesity, OSA on CPAP, nonspecific mediastinal adenopathy, history of asthma, prior history of DVT presented to the ED with chest pain, shortness of breath.  Noted to have a non-STEMI.  Transferred to [facility] and assessed by cardiology and underwent cardiac catheterization 05/05/2021.  Hospital course complicated by hypoxia.  Pulmonary consulted per cardiology."   #1 Non-STEMI -Patient presented with chest pain, shortness of breath. -Cardiac enzymes elevated with troponins peaking at 4682. -Chest x-ray done on admission with no acute cardiopulmonary disease. -CT angiogram chest with diffuse nonspecific mediastinal and hilar adenopathy, minimal scarring in the right middle lobe, dependent atelectasis focal subpleural opacity in the right lower lobe likely additional  atelectasis or scarring with airspace disease less favored. -Consulted cardiology  -Cardiac catheterization with Nonobstructive CAD.   See cath report for full details. 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 -Treated with IV heparin through 05/07/2021 per cardiology -Continue Lipitor, Lopressor. -Follow up with cardiology.  2.  Hypoxia secondary to atelectasis Hilar adenopathy - chronic -Patient noted to have sats in the 80s on room air while at rest. -Patient with history of OSA on CPAP nightly however unable to tolerate our CPAP in hospital. -Chest x-ray negative for any acute cardiopulmonary disease. -CT angiogram chest negative for PE, stable appearance of diffuse nonspecific mediastinal and hilar adenopathy, minimal scarring in the right middle lobe, dependent atelectasis focal subpleural opacity in the right lower lobe, likely additional atelectasis or scarring with airspace disease less favored.  -BNP within normal limits. -2D echo showed EF 60-65%, moderage LVH, normal diastolic parameters -Incentive spirometry. -Pulmonary consulted for eval of this and hilar adenopathy  3.  Hypertension chronic - controlled with BP soft at times -Continue metoprolol twice daily. -Stop Diovan-HCTZ at d/c to prevent hypotension -Cardiology consulted -Outpatient follow up  4.  Hyperlipidemia Fasting lipid panel with LDL of 81 -Continue statin.  5.  Hyperglycemia -Hemoglobin A1c 6.6% Covered with sliding scale Novolog during admission. -Diet and lifestyle modification. -Outpatient follow-up with PCP.  6.  Morbid obesity/OSA -Inable to tolerate CPAP overnight per patient due to mask.  7.  Chronic kidney disease stage IIIa -Stable at baseline. -Avoid nephrotoxic agents. -Diovan and HCTZ were held  during admission and stopped at d/c to avoid hypotension as BP's  were low/normal  8.  GERD - on PPI  9.  History of asthma - stable, not exacerbated --Bronchodilators as needed.  10.  Chronic mediastinal lymphadenopathy --Outpatient follow-up with pulmonary.       Discharge Instructions   Discharge Instructions     Amb Referral to Cardiac Rehabilitation   Complete by: As directed    Diagnosis: NSTEMI   After initial evaluation and assessments completed: Virtual Based Care may be provided alone or in conjunction with Phase 2 Cardiac Rehab based on patient barriers.: Yes   Call MD for:  extreme fatigue   Complete by: As directed    Call MD for:  persistant dizziness or light-headedness   Complete by: As directed    Call MD for:  persistant nausea and vomiting   Complete by: As directed    Call MD for:  severe uncontrolled pain   Complete by: As directed    Call MD for:  temperature >100.4   Complete by: As directed    Diet - low sodium heart healthy   Complete by: As directed    Increase activity slowly   Complete by: As directed       Allergies as of 05/07/2021   No Known Allergies      Medication List     STOP taking these medications    valsartan-hydrochlorothiazide 160-12.5 MG tablet Commonly known as: DIOVAN-HCT       TAKE these medications    albuterol 108 (90 Base) MCG/ACT inhaler Commonly known as: VENTOLIN HFA Inhale 1-2 puffs into the lungs every 6 (six) hours as needed for wheezing or shortness of breath.   Aspirin Low Dose 81 MG EC tablet Generic drug: aspirin Take 1 tablet (81 mg total) by mouth daily. Swallow whole.   atorvastatin 40 MG tablet Commonly known as: LIPITOR Take 1 tablet (40 mg total) by mouth daily. What changed:  medication strength how much to take   Brilinta 90 MG Tabs tablet Generic drug: ticagrelor Take 1 tablet (90 mg total) by mouth 2 (two) times daily.   cholecalciferol 25 MCG (1000 UNIT) tablet Commonly  known as: VITAMIN D Take 1,000 Units by mouth daily.   metoprolol tartrate 25 MG tablet Commonly known as: LOPRESSOR Take 1 tablet (25 mg total) by mouth 2 (two) times daily.   nitroGLYCERIN 0.4 MG SL tablet Commonly known as: NITROSTAT Place 1 tablet (0.4 mg total) under the tongue every 5 (five) minutes as needed for chest pain.   pantoprazole 40 MG tablet Commonly known as: PROTONIX Take 40 mg by mouth daily.        Follow-up Information     Nigel Mormon, MD Follow up on 05/13/2021.   Specialties: Cardiology, Radiology Why: 10:30 AM Contact information: [place] [id number] [phone]                No Known Allergies   If you experience worsening of your admission symptoms, develop shortness of breath, life threatening emergency, suicidal or homicidal thoughts you must seek medical attention immediately by calling 911 or calling your MD immediately  if symptoms less severe.    Please note   You were cared for by a hospitalist during your hospital stay. If you have any questions about your discharge medications or the care you received while you were in the hospital after you are discharged, you can call the unit and asked to speak with the hospitalist on call if the hospitalist that  took care of you is not available. Once you are discharged, your primary care physician will handle any further medical issues. Please note that NO REFILLS for any discharge medications will be authorized once you are discharged, as it is imperative that you return to your primary care physician (or establish a relationship with a primary care physician if you do not have one) for your aftercare needs so that they can reassess your need for medications and monitor your lab values.   Consultations: Cardiology Pulmonology    Procedures/Studies: DG Chest 2 View  Result Date: 05/05/2021 CLINICAL DATA:  Chest pain EXAM: CHEST - 2 VIEW  COMPARISON:  11/07/2020 FINDINGS: Heart and mediastinal contours are within normal limits. No focal opacities or effusions. No acute bony abnormality. IMPRESSION: No active cardiopulmonary disease. Electronically Signed   By: Rolm Baptise M.D.   On: 05/05/2021 00:38   CT Angio Chest PE W and/or Wo Contrast  Result Date: 05/05/2021 CLINICAL DATA:  Intermittent chest pain for 3 days with shortness of breath, hypoxic at home. History of asthma his and EXAM: CT ANGIOGRAPHY CHEST CT ABDOMEN AND PELVIS WITH CONTRAST TECHNIQUE: Multidetector CT imaging of the chest was performed using the standard protocol during bolus administration of intravenous contrast. Multiplanar CT image reconstructions and MIPs were obtained to evaluate the vascular anatomy. Multidetector CT imaging of the abdomen and pelvis was performed using the standard protocol during bolus administration of intravenous contrast. CONTRAST:  4mL OMNIPAQUE IOHEXOL 350 MG/ML SOLN COMPARISON:  PET-CT 04/24/2020 (images only) renal ultrasound 12/27/2020, CT 02/19/2013, CT chest 08/23/2020 FINDINGS: CTA CHEST FINDINGS Cardiovascular: Satisfactory opacification the pulmonary arteries to the segmental level. No pulmonary artery filling defects are identified accounting for some mild motion artifact. Central pulmonary arteries are normal caliber. Normal heart size. No pericardial effusion. The aortic root is suboptimally assessed given cardiac pulsation artifact. The aorta is normal caliber. No acute luminal abnormality of the imaged aorta. No periaortic stranding or hemorrhage. Shared origin of the brachiocephalic and left common carotid arteries. Proximal great vessels are otherwise unremarkable and free of acute abnormality. No major venous abnormalities nor significant venous reflux. Mediastinum/Nodes: Multiple enlarged mediastinal and hilar nodes are present. Index nodes include: Stable 22 mm subcarinal nodal deposit, difficult to the distinguish from the  margins of the esophagus (6/155). Stable 15 mm AP window lymph node (6/114). Stable 17 mm right hilar node (6/148). Stable 12 mm left hilar node (6/157). No other acute abnormality of the esophagus. Trachea is unremarkable. Thyroid gland and thoracic inlet is free of acute abnormality. No concerning axillary adenopathy. Lungs/Pleura: Dependent atelectatic changes are present. Some mild subpleural reticular change, likely chronic scarring, present in the right middle lobe (6/213). Additional scarring, atelectasis versus less likely airspace disease in the dependent right lower lobe (6/190). No other focal airspace opacity. No pneumothorax or visible effusion. Airways are patent Musculoskeletal: No acute osseous abnormality or suspicious osseous lesion. Worrisome chest wall mass or lesion. CT ABDOMEN and PELVIS FINDINGS Hepatobiliary: No worrisome focal liver lesions. Smooth liver surface contour. Normal hepatic attenuation. Gallbladder largely decompressed accounting for some mild gallbladder wall thickening. No visible calcified gallstones. No biliary ductal dilatation. Pancreas: No pancreatic ductal dilatation or surrounding inflammatory changes. Spleen: Top normal splenic size. No concerning focal splenic lesion. Adrenals/Urinary Tract: Normal adrenal glands. Kidneys are normally located with symmetric enhancement and excretion. Slightly diminutive right kidney may reflect some asymmetric atrophy. No suspicious renal lesion. Few scattered punctate nonobstructing calculi are present both kidneys, largest measuring up to  2 mm in the left kidney (3/41). No obstructive urolithiasis or hydronephrosis. Urinary bladder is unremarkable. Stomach/Bowel: Distal esophagus, stomach and duodenal sweep are unremarkable. No small bowel wall thickening or dilatation. No evidence of obstruction. A normal appendix is visualized. No colonic dilatation or wall thickening. Moderate colonic stool burden. Vascular/Lymphatic: No  significant vascular findings are present. No enlarged abdominal or pelvic lymph nodes. Reproductive: Coarse eccentric calcification of the prostate, typically benign. No concerning abnormalities of the prostate or seminal vesicles. Other: No abdominopelvic free fluid or free gas. No bowel containing hernias. Musculoskeletal: No acute osseous abnormality or suspicious osseous lesion. Mild degenerative changes most pronounced at L5-S1. Additional mild degenerative features in the bilateral hips. Review of the MIP images confirms the above findings. IMPRESSION: 1. Stable appearance of diffuse nonspecific mediastinal and hilar adenopathy. 2. Minimal scarring in the right middle lobe. 3. Dependent atelectasis focal subpleural opacity in the right lower lobe, likely additional atelectasis or scarring with airspace disease less favored. 4. No acute abdominopelvic abnormality. 5. Asymmetric atrophy of the right kidney. 6. Bilateral nonobstructing calculi without obstructive urolithiasis or hydronephrosis. These results were called by telephone at the time of interpretation on 05/05/2021 at 3:01 am to provider [facility] , who verbally acknowledged these results. Electronically Signed   By: Lovena Le M.D.   On: 05/05/2021 03:01   CT ABDOMEN PELVIS W CONTRAST  Result Date: 05/05/2021 CLINICAL DATA:  Intermittent chest pain for 3 days with shortness of breath, hypoxic at home. History of asthma his and EXAM: CT ANGIOGRAPHY CHEST CT ABDOMEN AND PELVIS WITH CONTRAST TECHNIQUE: Multidetector CT imaging of the chest was performed using the standard protocol during bolus administration of intravenous contrast. Multiplanar CT image reconstructions and MIPs were obtained to evaluate the vascular anatomy. Multidetector CT imaging of the abdomen and pelvis was performed using the standard protocol during bolus administration of intravenous contrast. CONTRAST:  20mL OMNIPAQUE IOHEXOL 350 MG/ML SOLN COMPARISON:  PET-CT  04/24/2020 (images only) renal ultrasound 12/27/2020, CT 02/19/2013, CT chest 08/23/2020 FINDINGS: CTA CHEST FINDINGS Cardiovascular: Satisfactory opacification the pulmonary arteries to the segmental level. No pulmonary artery filling defects are identified accounting for some mild motion artifact. Central pulmonary arteries are normal caliber. Normal heart size. No pericardial effusion. The aortic root is suboptimally assessed given cardiac pulsation artifact. The aorta is normal caliber. No acute luminal abnormality of the imaged aorta. No periaortic stranding or hemorrhage. Shared origin of the brachiocephalic and left common carotid arteries. Proximal great vessels are otherwise unremarkable and free of acute abnormality. No major venous abnormalities nor significant venous reflux. Mediastinum/Nodes: Multiple enlarged mediastinal and hilar nodes are present. Index nodes include: Stable 22 mm subcarinal nodal deposit, difficult to the distinguish from the margins of the esophagus (6/155). Stable 15 mm AP window lymph node (6/114). Stable 17 mm right hilar node (6/148). Stable 12 mm left hilar node (6/157). No other acute abnormality of the esophagus. Trachea is unremarkable. Thyroid gland and thoracic inlet is free of acute abnormality. No concerning axillary adenopathy. Lungs/Pleura: Dependent atelectatic changes are present. Some mild subpleural reticular change, likely chronic scarring, present in the right middle lobe (6/213). Additional scarring, atelectasis versus less likely airspace disease in the dependent right lower lobe (6/190). No other focal airspace opacity. No pneumothorax or visible effusion. Airways are patent Musculoskeletal: No acute osseous abnormality or suspicious osseous lesion. Worrisome chest wall mass or lesion. CT ABDOMEN and PELVIS FINDINGS Hepatobiliary: No worrisome focal liver lesions. Smooth liver surface contour. Normal hepatic attenuation.  Gallbladder largely decompressed  accounting for some mild gallbladder wall thickening. No visible calcified gallstones. No biliary ductal dilatation. Pancreas: No pancreatic ductal dilatation or surrounding inflammatory changes. Spleen: Top normal splenic size. No concerning focal splenic lesion. Adrenals/Urinary Tract: Normal adrenal glands. Kidneys are normally located with symmetric enhancement and excretion. Slightly diminutive right kidney may reflect some asymmetric atrophy. No suspicious renal lesion. Few scattered punctate nonobstructing calculi are present both kidneys, largest measuring up to 2 mm in the left kidney (3/41). No obstructive urolithiasis or hydronephrosis. Urinary bladder is unremarkable. Stomach/Bowel: Distal esophagus, stomach and duodenal sweep are unremarkable. No small bowel wall thickening or dilatation. No evidence of obstruction. A normal appendix is visualized. No colonic dilatation or wall thickening. Moderate colonic stool burden. Vascular/Lymphatic: No significant vascular findings are present. No enlarged abdominal or pelvic lymph nodes. Reproductive: Coarse eccentric calcification of the prostate, typically benign. No concerning abnormalities of the prostate or seminal vesicles. Other: No abdominopelvic free fluid or free gas. No bowel containing hernias. Musculoskeletal: No acute osseous abnormality or suspicious osseous lesion. Mild degenerative changes most pronounced at L5-S1. Additional mild degenerative features in the bilateral hips. Review of the MIP images confirms the above findings. IMPRESSION: 1. Stable appearance of diffuse nonspecific mediastinal and hilar adenopathy. 2. Minimal scarring in the right middle lobe. 3. Dependent atelectasis focal subpleural opacity in the right lower lobe, likely additional atelectasis or scarring with airspace disease less favored. 4. No acute abdominopelvic abnormality. 5. Asymmetric atrophy of the right kidney. 6. Bilateral nonobstructing calculi without  obstructive urolithiasis or hydronephrosis. These results were called by telephone at the time of interpretation on 05/05/2021 at 3:01 am to provider [facility] , who verbally acknowledged these results. Electronically Signed   By: Lovena Le M.D.   On: 05/05/2021 03:01   CARDIAC CATHETERIZATION  Result Date: 05/05/2021 Images from the original result were not included. LM:  Very large. Normal LAD: Very large. 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 Normal RCA: Very large. Difficult to opacify distally, but probably normal. 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 No treatable lesion identified. Recommend DAPT with Aspirin and brilinta. Recommend Saline 75 cc for 8 hrs, along with IV lasix 40 mg to maintain urine output and reduce CIN risk Nigel Mormon, MD Pager: [phone] Office: [phone]   DG CHEST PORT 1 VIEW  Result Date: 05/06/2021 CLINICAL DATA:  Shortness of breath, asthma EXAM: PORTABLE CHEST 1 VIEW COMPARISON:  Portable exam 0839 hours compared to 05/05/2021 FINDINGS: Normal heart size, mediastinal contours, and pulmonary vascularity. Lungs clear. No infiltrate, pleural effusion, or pneumothorax. Osseous structures unremarkable. IMPRESSION: No acute abnormalities. Electronically Signed   By: Lavonia Dana M.D.   On: 05/06/2021 08:45   ECHOCARDIOGRAM COMPLETE  Result Date: 05/06/2021    ECHOCARDIOGRAM REPORT   Patient Name:   Zachary Weeks Date of Exam: 05/06/2021 Medical Rec #:  [id number]   Height:       72.0 in Accession #:    [id number]  Weight:       320.0 lb Date of Birth:  Jan 22, 1981    BSA:          2.602 m Patient Age:    40 years    BP:           133/78 mmHg Patient Gender: M           HR:  74 bpm. Exam Location:  Inpatient Procedure: 2D Echo, Color Doppler, Cardiac Doppler and Intracardiac            Opacification Agent Indications:     R07.9* Chest pain, unspecified  History:         Patient has no prior  history of Echocardiogram examinations.                  Risk Factors:Hypertension and Sleep Apnea.  Sonographer:     Raquel Sarna Senior RDCS Referring Phys:  [place] Diagnosing Phys: Vernell Leep MD IMPRESSIONS  1. Left ventricular ejection fraction, by estimation, is 60 to 65%. The left ventricle has normal function. The left ventricle has no regional wall motion abnormalities. There is moderate asymmetric left ventricular hypertrophy. Left ventricular diastolic parameters were normal.  2. Right ventricular systolic function is normal. The right ventricular size is normal.  3. No significant valvular abnormality.  4. The inferior vena cava is normal in size with greater than 50% respiratory variability, suggesting right atrial pressure of 3 mmHg. FINDINGS  Left Ventricle: Left ventricular ejection fraction, by estimation, is 60 to 65%. The left ventricle has normal function. The left ventricle has no regional wall motion abnormalities. Definity contrast agent was given IV to delineate the left ventricular  endocardial borders. The left ventricular internal cavity size was normal in size. There is moderate asymmetric left ventricular hypertrophy. Left ventricular diastolic parameters were normal. Right Ventricle: The right ventricular size is normal. No increase in right ventricular wall thickness. Right ventricular systolic function is normal. Left Atrium: Left atrial size was normal in size. Right Atrium: Right atrial size was normal in size. Pericardium: There is no evidence of pericardial effusion. Mitral Valve: The mitral valve is grossly normal. No evidence of mitral valve regurgitation. Tricuspid Valve: The tricuspid valve is grossly normal. Tricuspid valve regurgitation is not demonstrated. Aortic Valve: The aortic valve is tricuspid. Aortic valve regurgitation is not visualized. Pulmonic Valve: The pulmonic valve was normal in structure. Pulmonic valve regurgitation is not visualized. Aorta: The  aortic root and ascending aorta are structurally normal, with no evidence of dilitation. Venous: The inferior vena cava is normal in size with greater than 50% respiratory variability, suggesting right atrial pressure of 3 mmHg. IAS/Shunts: No atrial level shunt detected by color flow Doppler.  LEFT VENTRICLE PLAX 2D LVIDd:         3.60 cm  Diastology LVIDs:         2.50 cm  LV e' medial:    5.35 cm/s LV PW:         1.30 cm  LV E/e' medial:  12.1 LV IVS:        2.10 cm  LV e' lateral:   11.70 cm/s LVOT diam:     2.00 cm  LV E/e' lateral: 5.5 LV SV:         59 LV SV Index:   23 LVOT Area:     3.14 cm  RIGHT VENTRICLE RV S prime:     10.80 cm/s TAPSE (M-mode): 1.8 cm LEFT ATRIUM             Index       RIGHT ATRIUM           Index LA diam:        4.20 cm 1.61 cm/m  RA Area:     14.70 cm LA Vol (A2C):   50.5 ml 19.41 ml/m RA Volume:   37.80 ml  14.53 ml/m LA Vol (A4C):   29.6 ml 11.38 ml/m LA Biplane Vol: 39.3 ml 15.11 ml/m  AORTIC VALVE LVOT Vmax:   99.00 cm/s LVOT Vmean:  76.300 cm/s LVOT VTI:    0.188 m  AORTA Ao Root diam: 3.40 cm MITRAL VALVE MV Area (PHT): 2.80 cm    SHUNTS MV Decel Time: 271 msec    Systemic VTI:  0.19 m MV E velocity: 64.50 cm/s  Systemic Diam: 2.00 cm MV A velocity: 60.20 cm/s MV E/A ratio:  1.07 Manish Patwardhan MD Electronically signed by Vernell Leep MD Signature Date/Time: 05/06/2021/5:02:27 PM    Final        Subjective: Pt reports feeling well. No chest pain or SOB.  Tolerating ambulation.  No acute complaints.    Discharge Exam: Vitals:   05/07/21 0401 05/07/21 1139  BP: 115/65 122/84  Pulse: 81 93  Resp: 16 (!) 22  Temp: 98 F (36.7 C) 97.8 F (36.6 C)  SpO2: 91% 91%   Vitals:   05/06/21 2300 05/07/21 0401 05/07/21 0500 05/07/21 1139  BP: 116/82 115/65  122/84  Pulse: 85 81  93  Resp: 16 16  (!) 22  Temp: 98.1 F (36.7 C) 98 F (36.7 C)  97.8 F (36.6 C)  TempSrc: Oral Oral  Oral  SpO2: 90% 91%  91%  Weight:   (!) 146.9 kg   Height:         General: Pt is alert, awake, not in acute distress Cardiovascular: RRR, S1/S2 +, no rubs, no gallops Respiratory: CTA bilaterally with diminished bases, no wheezing, no rhonchi Abdominal: Soft, NT, ND, bowel sounds + Extremities: no edema, no cyanosis    The results of significant diagnostics from this hospitalization (including imaging, microbiology, ancillary and laboratory) are listed below for reference.     Microbiology: Recent Results (from the past 240 hour(s))  Resp Panel by RT-PCR (Flu A&B, Covid) Nasopharyngeal Swab     Status: None   Collection Time: 05/05/21  7:45 AM   Specimen: Nasopharyngeal Swab; Nasopharyngeal(NP) swabs in vial transport medium  Result Value Ref Range Status   SARS Coronavirus 2 by RT PCR NEGATIVE NEGATIVE Final    Comment: (NOTE) SARS-CoV-2 target nucleic acids are NOT DETECTED.  The SARS-CoV-2 RNA is generally detectable in upper respiratory specimens during the acute phase of infection. The lowest concentration of SARS-CoV-2 viral copies this assay can detect is 138 copies/mL. A negative result does not preclude SARS-Cov-2 infection and should not be used as the sole basis for treatment or other patient management decisions. A negative result may occur with  improper specimen collection/handling, submission of specimen other than nasopharyngeal swab, presence of viral mutation(s) within the areas targeted by this assay, and inadequate number of viral copies(<138 copies/mL). A negative result must be combined with clinical observations, patient history, and epidemiological information. The expected result is Negative.  Fact Sheet for Patients:  EntrepreneurPulse.com.au  Fact Sheet for Healthcare Providers:  IncredibleEmployment.be  This test is no t yet approved or cleared by the [place] FDA and  has been authorized for detection and/or diagnosis of SARS-CoV-2 by FDA under an Emergency Use  Authorization (EUA). This EUA will remain  in effect (meaning this test can be used) for the duration of the COVID-19 declaration under Section 564(b)(1) of the Act, 21 U.S.C.section 360bbb-3(b)(1), unless the authorization is terminated  or revoked sooner.       Influenza A by PCR NEGATIVE NEGATIVE Final   Influenza B by PCR  NEGATIVE NEGATIVE Final    Comment: (NOTE) The Xpert Xpress SARS-CoV-2/FLU/RSV plus assay is intended as an aid in the diagnosis of influenza from Nasopharyngeal swab specimens and should not be used as a sole basis for treatment. Nasal washings and aspirates are unacceptable for Xpert Xpress SARS-CoV-2/FLU/RSV testing.  Fact Sheet for Patients: EntrepreneurPulse.com.au  Fact Sheet for Healthcare Providers: IncredibleEmployment.be  This test is not yet approved or cleared by the [place] FDA and has been authorized for detection and/or diagnosis of SARS-CoV-2 by FDA under an Emergency Use Authorization (EUA). This EUA will remain in effect (meaning this test can be used) for the duration of the COVID-19 declaration under Section 564(b)(1) of the Act, 21 U.S.C. section 360bbb-3(b)(1), unless the authorization is terminated or revoked.  Performed at [facility], [address] [place], [place] [address]      Labs: BNP (last 3 results) Recent Labs    05/05/21 0011 05/06/21 0929  BNP 44.0 16.0   Basic Metabolic Panel: Recent Labs  Lab 05/05/21 0011 05/05/21 0915 05/06/21 0021 05/06/21 0804 05/07/21 0148  NA 133* 134* 132*  --  137  K 3.6 4.1 5.3* 4.2 3.9  CL 100 101 98  --  97*  CO2 24 24 25   --  27  GLUCOSE 234* 141* 130*  --  128*  BUN 18 16 16   --  20  CREATININE 1.68* 1.40* 1.46*  --  1.49*  CALCIUM 8.9 8.5* 8.7*  --  9.4  MG  --   --   --   --  2.2   Liver Function Tests: No results for input(s): AST, ALT, ALKPHOS, BILITOT, PROT, ALBUMIN in the last 168 hours. No results for input(s):  LIPASE, AMYLASE in the last 168 hours. No results for input(s): AMMONIA in the last 168 hours. CBC: Recent Labs  Lab 05/05/21 0011 05/06/21 0021 05/07/21 0148  WBC 9.0 8.4 9.1  NEUTROABS  --   --  5.9  HGB 16.6 16.1 17.0  HCT 50.7 49.9 52.8*  MCV 85.6 84.9 87.9  PLT 190 198 178   Cardiac Enzymes: No results for input(s): CKTOTAL, CKMB, CKMBINDEX, TROPONINI in the last 168 hours. BNP: Invalid input(s): POCBNP CBG: Recent Labs  Lab 05/06/21 1613 05/06/21 2133 05/07/21 0624 05/07/21 1139  GLUCAP 116* 144* 132* 122*   D-Dimer No results for input(s): DDIMER in the last 72 hours. Hgb A1c Recent Labs    05/05/21 0011  HGBA1C 6.6*   Lipid Profile Recent Labs    05/05/21 0727  CHOL 158  HDL 32*  LDLCALC 81  TRIG 227*  CHOLHDL 4.9   Thyroid function studies Recent Labs    05/05/21 0011  TSH 3.716   Anemia work up No results for input(s): VITAMINB12, FOLATE, FERRITIN, TIBC, IRON, RETICCTPCT in the last 72 hours. Urinalysis    Component Value Date/Time   APPEARANCEUR Clear 01/23/2021 0914   GLUCOSEU Negative 01/23/2021 0914   BILIRUBINUR Negative 01/23/2021 0914   PROTEINUR Negative 01/23/2021 0914   NITRITE Negative 01/23/2021 0914   LEUKOCYTESUR Negative 01/23/2021 0914   Sepsis Labs Invalid input(s): PROCALCITONIN,  WBC,  LACTICIDVEN Microbiology Recent Results (from the past 240 hour(s))  Resp Panel by RT-PCR (Flu A&B, Covid) Nasopharyngeal Swab     Status: None   Collection Time: 05/05/21  7:45 AM   Specimen: Nasopharyngeal Swab; Nasopharyngeal(NP) swabs in vial transport medium  Result Value Ref Range Status   SARS Coronavirus 2 by RT PCR NEGATIVE NEGATIVE Final  Comment: (NOTE) SARS-CoV-2 target nucleic acids are NOT DETECTED.  The SARS-CoV-2 RNA is generally detectable in upper respiratory specimens during the acute phase of infection. The lowest concentration of SARS-CoV-2 viral copies this assay can detect is 138 copies/mL. A negative  result does not preclude SARS-Cov-2 infection and should not be used as the sole basis for treatment or other patient management decisions. A negative result may occur with  improper specimen collection/handling, submission of specimen other than nasopharyngeal swab, presence of viral mutation(s) within the areas targeted by this assay, and inadequate number of viral copies(<138 copies/mL). A negative result must be combined with clinical observations, patient history, and epidemiological information. The expected result is Negative.  Fact Sheet for Patients:  EntrepreneurPulse.com.au  Fact Sheet for Healthcare Providers:  IncredibleEmployment.be  This test is no t yet approved or cleared by the [place] FDA and  has been authorized for detection and/or diagnosis of SARS-CoV-2 by FDA under an Emergency Use Authorization (EUA). This EUA will remain  in effect (meaning this test can be used) for the duration of the COVID-19 declaration under Section 564(b)(1) of the Act, 21 U.S.C.section 360bbb-3(b)(1), unless the authorization is terminated  or revoked sooner.       Influenza A by PCR NEGATIVE NEGATIVE Final   Influenza B by PCR NEGATIVE NEGATIVE Final    Comment: (NOTE) The Xpert Xpress SARS-CoV-2/FLU/RSV plus assay is intended as an aid in the diagnosis of influenza from Nasopharyngeal swab specimens and should not be used as a sole basis for treatment. Nasal washings and aspirates are unacceptable for Xpert Xpress SARS-CoV-2/FLU/RSV testing.  Fact Sheet for Patients: EntrepreneurPulse.com.au  Fact Sheet for Healthcare Providers: IncredibleEmployment.be  This test is not yet approved or cleared by the [place] FDA and has been authorized for detection and/or diagnosis of SARS-CoV-2 by FDA under an Emergency Use Authorization (EUA). This EUA will remain in effect (meaning this test can be used)  for the duration of the COVID-19 declaration under Section 564(b)(1) of the Act, 21 U.S.C. section 360bbb-3(b)(1), unless the authorization is terminated or revoked.  Performed at [facility], [address] [place], [place] [address]      Time coordinating discharge: Over 30 minutes  SIGNED:   Ezekiel Slocumb, DO [facility] 05/07/2021, 12:08 PM   If 7PM-7AM, please contact night-coverage [email or web address]

## 2021-05-07 NOTE — TOC Transition Note (Signed)
Transition of Care [facility]) - CM/SW Discharge Note   Patient Details  Name: Mahmoud Blazejewski MRN: [id number] Date of Birth: 04/01/1981  Transition of Care [facility]) CM/SW Contact:  Zenon Mayo, RN Phone Number: 05/07/2021, 12:31 PM   Clinical Narrative:    Patient is for dc today, NCM informed patient of his co pay for refills, and informed him if his deductible has been met he can use the brilitnta 5.00 co pay card.  NCM asked staff RN to give patient the brilinta coupon prior to discharge.   Final next level of care: Home/Self Care Barriers to Discharge: No Barriers Identified   Patient Goals and CMS Choice Patient states their goals for this hospitalization and ongoing recovery are:: to return home   Choice offered to / list presented to : NA  Discharge Placement                       Discharge Plan and Services                  DME Agency: NA       HH Arranged: NA          Social Determinants of Health (SDOH) Interventions     Readmission Risk Interventions No flowsheet data found.

## 2021-05-07 NOTE — Progress Notes (Signed)
CARDIAC REHAB PHASE I   PRE:  Rate/Rhythm: 95 SR    BP: sitting 122/84    SaO2: 90 RA  MODE:  Ambulation: 680 ft   POST:  Rate/Rhythm: 104 ST    BP: sitting      SaO2: 93-94 RA  Pt up in recliner, SaO2 90 RA. Walked and increased SaO2, felt well. No c/o, no questions. [place], ACSM 05/07/2021 12:07 PM

## 2021-05-13 ENCOUNTER — Telehealth: Payer: Managed Care, Other (non HMO) | Admitting: Cardiology

## 2021-05-13 ENCOUNTER — Encounter: Payer: Self-pay | Admitting: Cardiology

## 2021-05-13 ENCOUNTER — Telehealth: Payer: Self-pay | Admitting: Pulmonary Disease

## 2021-05-13 ENCOUNTER — Other Ambulatory Visit: Payer: Self-pay

## 2021-05-13 VITALS — Ht 72.0 in | Wt 321.0 lb

## 2021-05-13 DIAGNOSIS — R590 Localized enlarged lymph nodes: Secondary | ICD-10-CM

## 2021-05-13 DIAGNOSIS — I2510 Atherosclerotic heart disease of native coronary artery without angina pectoris: Secondary | ICD-10-CM

## 2021-05-13 NOTE — Telephone Encounter (Signed)
Left message for patient to call back  

## 2021-05-13 NOTE — Telephone Encounter (Signed)
Zachary Noel, MD  Rosana Berger, CMA; P Lbpu Triage Pool Caller: Unspecified (Today, 11:21 AM)  He can return to work if his oxygen level is okay.  We can assess him on 7/8.   I called and spoke with the pt and notified him of Dr Bari Mantis response. He states in this case, he needs a letter stating he has to remain out of work until 7/8 at least. Can I create letter stating this? Thanks!

## 2021-05-13 NOTE — Telephone Encounter (Signed)
I called and spoke with the pt  He states that his cardiologist cleared him for return to work from cardiology standpoint today  He states that they told him he still needs clearance from Dr Elsworth Soho to go back to work  He is not scheduled for f/u with Dr Elsworth Soho until 05/30/21  If he can not be cleared sooner, he needs a letter stating needs to remain out  He states his breathing is unchanged since the last visit- no better or worse Please advise, thanks!

## 2021-05-13 NOTE — Progress Notes (Addendum)
Follow up visit Virtual visit  Subjective:   Zachary Weeks, male    DOB: [birth year], 40 y.o.   MRN: [id number]  I connected with the patient on 05/13/2021 by a video enabled telemedicine application and verified that I am speaking with the correct person using two identifiers.     I discussed the limitations of evaluation and management by telemedicine and the availability of in person appointments. The patient expressed understanding and agreed to proceed.   This visit type was conducted due to national recommendations for restrictions regarding the COVID-19 Pandemic (e.g. social distancing).  This format is felt to be most appropriate for this patient at this time.  All issues noted in this document were discussed and addressed.  No physical exam was performed (except for noted visual exam findings with Tele health visits).  The patient has consented to conduct a Tele health visit and understands insurance will be billed.   HPI  Chief Complaint  Patient presents with   Hypertension   Hospitalization Follow-up    Non stemi    40 y.o. 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  Patient is doing well since discharge. He only has occasional right sided chest pain on breathing. He was walked at [facility] for 20 min without chest pain. He is using CPAP at home regularly. O2 sats are still around 92-94%, with only occasional episodes of 89-90%. He is not using Dulera as recommended by pulmonology, as it has caused him side effects in the past.   On a separate note, he reports that he has burning and tingling sensation in his left wrist where he had ABG performed.    Current Outpatient Medications on File Prior to Visit  Medication Sig Dispense Refill   albuterol (VENTOLIN HFA) 108 (90 Base) MCG/ACT inhaler Inhale 1-2 puffs into the  lungs every 6 (six) hours as needed for wheezing or shortness of breath.     aspirin 81 MG EC tablet Take 1 tablet (81 mg total) by mouth daily. Swallow whole. 30 tablet 1   atorvastatin (LIPITOR) 40 MG tablet Take 1 tablet (40 mg total) by mouth daily. 30 tablet 1   cholecalciferol (VITAMIN D) 25 MCG (1000 UNIT) tablet Take 1,000 Units by mouth daily.     metoprolol tartrate (LOPRESSOR) 25 MG tablet Take 1 tablet (25 mg total) by mouth 2 (two) times daily. 60 tablet 1   nitroGLYCERIN (NITROSTAT) 0.4 MG SL tablet Place 1 tablet (0.4 mg total) under the tongue every 5 (five) minutes as needed for chest pain. 30 tablet 1   pantoprazole (PROTONIX) 40 MG tablet Take 40 mg by mouth daily.     ticagrelor (BRILINTA) 90 MG TABS tablet Take 1 tablet (90 mg total) by mouth 2 (two) times daily. 60 tablet 1   No current facility-administered medications on file prior to visit.    Cardiovascular & other pertient studies:  Echocardiogram 05/06/2021:  1. Left ventricular ejection fraction, by estimation, is 60 to 65%. The  left ventricle has normal function. The left ventricle has no regional  wall motion abnormalities. There is moderate asymmetric left ventricular  hypertrophy. Left ventricular  diastolic parameters were normal.   2. Right ventricular systolic function is normal. The right ventricular  size is normal.   3. No significant valvular abnormality.   4. The inferior vena cava is normal in size with  greater than 50%  respiratory variability, suggesting right atrial pressure of 3 mmHg.   Coronary angiogram 05/05/2021: LM:  Very large. Normal LAD: Very large. 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 Normal RCA: Very large. Difficult to opacify distally, but probably normal.   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 No treatable lesion identified. Recommend DAPT with Aspirin and brilinta.   EKG 05/04/2021: Sinus tachycardia 114  bpm No acute ischemic changes     Recent labs: 05/07/2021: Glucose 128, BUN/Cr 20/1.49. EGFR 60. Na/K 137/3.9.  H/H 17/52. MCV 87. Platelets 178 HbA1C 6.6% Chol 158, TG 227, HDL 32, LDL 81 TSH 3.7 normal   Review of Systems  Cardiovascular:  Positive for chest pain (Right sided, occasional) and dyspnea on exertion (Improved). Negative for leg swelling, palpitations and syncope.  Neurological:  Positive for paresthesias.        Vitals:   BP, HR not available  Body mass index is 43.54 kg/m. Filed Weights   05/13/21 1033  Weight: (!) 321 lb (145.6 kg)    Objective:   Physical Exam Vitals and nursing note reviewed.  Constitutional:      General: He is not in acute distress.    Appearance: He is well-developed.  Pulmonary:     Effort: Pulmonary effort is normal.  Musculoskeletal:     Right lower leg: No edema.     Left lower leg: No edema.  Neurological:     Mental Status: He is alert and oriented to person, place, and time.          Assessment & Recommendations:   40 y.o. 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   NSTEMI: Nonobstructive CAD with aneurysmal coronaries Recommend DAPT for 1 year, statin, metoprolol  Hilar and mediastinal adenopathy, hypoxia: Noncardiac in etiology. Continue pulmonary follow up. If needed, right heart cath can be performed outpatient. Defer to pulmonology.   H/o DVT: In 2020 treated with ? 6 months of anticoagulation He has 3 family members on maternal side with with h/o lupus anticoagulant May need outpatient hematology consult. Defer to PCP.  Paresthesias: Possible that he ma have tendonitis or median nerve injury from attempted left radial puncture for ABG. Consider neurology referral.  Will refer him to outpatient cardiac rehab in [facility]  May  return to work form cardiac standpoint  Lipid panel and f/u in 3 months    Nigel Mormon, MD Pager: [phone] Office: [phone]

## 2021-05-14 ENCOUNTER — Telehealth: Payer: Self-pay | Admitting: Pulmonary Disease

## 2021-05-14 ENCOUNTER — Encounter: Payer: Self-pay | Admitting: Cardiology

## 2021-05-14 NOTE — Telephone Encounter (Signed)
Spoke with the pt  He is now requesting note to return to work  Explained to him as per last phone note, that Dr Elsworth Soho can't give any sort of work note until he sees [place] again  I offered sooner appt with APP in [place] and he states going out of town next wk so he can't  He states he will discuss with his employer and call back if needed

## 2021-05-14 NOTE — Telephone Encounter (Signed)
Per Dr Elsworth Soho:  We cannot provide any letters without evaluating him.  If his hospital doctors or cardiologist wanted him to be out, they would have given him letters already.  So would suggest he go back to work since he has been cleared by hiscardiologist   Spoke with the pt and notified of response per Dr Elsworth Soho. Pt verbalized understanding. Nothing further needed.

## 2021-05-15 ENCOUNTER — Other Ambulatory Visit (HOSPITAL_COMMUNITY): Payer: Self-pay

## 2021-05-15 ENCOUNTER — Encounter: Payer: Self-pay | Admitting: Urology

## 2021-05-15 ENCOUNTER — Ambulatory Visit (INDEPENDENT_AMBULATORY_CARE_PROVIDER_SITE_OTHER): Payer: Managed Care, Other (non HMO) | Admitting: Urology

## 2021-05-15 ENCOUNTER — Telehealth (HOSPITAL_COMMUNITY): Payer: Self-pay | Admitting: Pharmacist

## 2021-05-15 ENCOUNTER — Other Ambulatory Visit: Payer: Self-pay

## 2021-05-15 VITALS — BP 142/93 | HR 76 | Ht 72.0 in | Wt 325.1 lb

## 2021-05-15 DIAGNOSIS — R3915 Urgency of urination: Secondary | ICD-10-CM | POA: Diagnosis not present

## 2021-05-15 DIAGNOSIS — N200 Calculus of kidney: Secondary | ICD-10-CM

## 2021-05-15 NOTE — Progress Notes (Signed)
Urological Symptom Review  Patient is experiencing the following symptoms:   Kidney Stones   Review of Systems  Gastrointestinal (upper)  : Negative for upper GI symptoms  Gastrointestinal (lower) : Negative for lower GI symptoms  Constitutional : Negative for symptoms  Skin: Negative for skin symptoms  Eyes: Negative for eye symptoms  Ear/Nose/Throat : Negative for Ear/Nose/Throat symptoms  Hematologic/Lymphatic: Swollen glands  Cardiovascular : Negative for cardiovascular symptoms  Respiratory : Negative for respiratory symptoms  Endocrine: Negative for endocrine symptoms  Musculoskeletal: Negative for musculoskeletal symptoms  Neurological: Negative for neurological symptoms  Psychologic: Negative for psychiatric symptoms

## 2021-05-15 NOTE — Telephone Encounter (Signed)
Pharmacy Transitions of Care Follow-up Telephone Call  Date of discharge: 05/07/21  Discharge Diagnosis: NSTEMI  How have you been since you were released from the hospital?  Good overall, no complaints, still having some mild SOB, otherwise feeling alright  Medication changes made at discharge:  - START:  Aspirin Low Dose (aspirin)  Brilinta (ticagrelor)  metoprolol tartrate (LOPRESSOR)  nitroGLYCERIN (NITROSTAT)    - STOPPED:  valsartan-hydrochlorothiazide 160-12.5 MG tablet (DIOVAN-HCT)    - CHANGED: Atorvastatin  Medication changes verified by the patient? Yes    Medication Accessibility:  Home Pharmacy: [facility]  Was the patient provided with refills on discharged medications? yes   Have all prescriptions been transferred from [facility] to home pharmacy?  yes  Is the patient able to afford medications? Yes Notable copays: Brilinta $25/mo Eligible patient assistance: Yes, brilinta copay card, $5    Medication Review:   TICAGRELOR (BRILINTA) Ticagrelor 90 mg BID initiated on 05/06/21.  - Educated patient on expected duration of therapy of aspirin 81mg  with ticagrelor for 1 year.  Advised patient that dose of ticagrelor may be reduced after to 60mg  BID after 1 year. Aspirin will be likely be continued indefinitely. - Discussed importance of taking medication around the same time every day - Reviewed potential DDIs with patient - Advised patient of medications to avoid (NSAIDs, aspirin maintenance doses>100 mg daily) - Educated that Tylenol (acetaminophen) will be the preferred analgesic to prevent risk of bleeding  - Emphasized importance of monitoring for signs and symptoms of bleeding (abnormal bruising, prolonged bleeding, nose bleeds, bleeding from gums, discolored urine, black tarry stools)  - Educated patient to notify doctor if shortness of breath or abnormal heartbeat occur - Advised patient to alert all providers of antiplatelet therapy prior to starting a new  medication or having a procedure    Follow-up Appointments:  PCP Hospital f/u appt confirmed?  No, but pt plans to schedule an appt soon.   [facility] f/u appt confirmed? Yes  Scheduled to see Dr. Jeffie Pollock (urology) on 05/15/21 @ 3:30. Scheduled to see Dr. Elsworth Soho (pulmonology) on 05/30/21 @ 10:00  Cardiology Follow-up completed on 05/13/21  Cardiac Rehab - yes    If their condition worsens, is the pt aware to call PCP or go to the Emergency Dept.? Yes  Final Patient Assessment: Pt was pleasant and well informed about his new medications and current conditions.  He is taking new meds appropriately and has no issues at this time.

## 2021-05-15 NOTE — Progress Notes (Signed)
Subjective: 1. Renal stones   2. Urgency of urination      Consult requested by Zachary Bold MD  Zachary Weeks is a 40 yo male with CKD 3 who had a renal US as part of his evaluation and was found to have bilateral non-obstructing renal stones.   He had a PET CT for adenopathy on 04/24/20 and no stones were seen.  He was in the ER on 05/05/21 had Chest tightness.  He had a CT Chest and they went ahead and did his CT AP.  He had 2 very small non-obstructing left renal stones and a few tiny RLP  and RUP stones as well.  He has some right renal atrophy.  He has had no flank pain or hematuria.   He has some increased frequency and urgency.  He has nocturia x 3.  His IPSS is 5.  He has no hesitancy or a reduced stream.  He has increased his water intake but doesn't drink coffee or tea.   He has normocalcemia.   He is currently on asa and brilinta for a possible plaque erosion with a mild MI.  He has long standing CKD3 and is seeing nephrology.  His recent PSA on 01/23/21 was only 1.0 with a 31% f/t ratio.   ROS:  ROS  No Known Allergies  Past Medical History:  Diagnosis Date   Asthma    DVT (deep venous thrombosis) (HCC)    Dyspnea    at times - no known reason.    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   Pneumonia    51- 48 years of age    Past Surgical History:  Procedure Laterality Date   BRONCHIAL NEEDLE ASPIRATION BIOPSY  11/01/2020   Procedure: BRONCHIAL NEEDLE ASPIRATION BIOPSIES;  Surgeon: Rigoberto Noel, MD;  Location: [facility];  Service: Cardiopulmonary;;   BRONCHIAL WASHINGS  11/01/2020   Procedure: BRONCHIAL WASHINGS;  Surgeon: Rigoberto Noel, MD;  Location: [facility];  Service: Cardiopulmonary;;   INTRAVASCULAR ULTRASOUND/IVUS N/A 05/05/2021   Procedure: Intravascular Ultrasound/IVUS;  Surgeon: Nigel Mormon, MD;  Location: [facility];  Service: Cardiovascular;  Laterality:  N/A;   LEFT HEART CATH AND CORONARY ANGIOGRAPHY N/A 05/05/2021   Procedure: LEFT HEART CATH AND CORONARY ANGIOGRAPHY;  Surgeon: Nigel Mormon, MD;  Location: [facility];  Service: Cardiovascular;  Laterality: N/A;   NO PAST SURGERIES     VIDEO BRONCHOSCOPY N/A 11/01/2020   Procedure: VIDEO BRONCHOSCOPY WITH ENDOBRONCHIAL ULTRASOUND;  Surgeon: Rigoberto Noel, MD;  Location: [facility];  Service: Cardiopulmonary;  Laterality: N/A;    Social History   Socioeconomic History   Marital status: Legally Separated    Spouse name: Not on file   Number of children: 2   Years of education: Not on file   Highest education level: High school graduate  Occupational History   Not on file  Tobacco Use   Smoking status: Former    Packs/day: 1.00    Years: 3.00    Pack years: 3.00    Types: Cigarettes    Quit date: 1999    Years since quitting: 23.4   Smokeless tobacco: Current    Types: Chew, Snuff  Vaping Use   Vaping Use: Never used  Substance and Sexual Activity   Alcohol use: Yes    Comment: rarely- maybe 2 mixed drinks in a year  Drug use: Never   Sexual activity: Not on file  Other Topics Concern   Not on file  Social History Narrative   Lives with family   Caffeine- occass soda   Social Determinants of Health   Financial Resource Strain: Not on file  Food Insecurity: Not on file  Transportation Needs: Not on file  Physical Activity: Not on file  Stress: Not on file  Social Connections: Not on file  Intimate Partner Violence: Not on file    Family History  Problem Relation Age of Onset   Pulmonary fibrosis Mother    Cancer Mother    Cancer Father        prostate   Hypertension Father    Fibromyalgia Maternal Aunt    Lupus Maternal Grandmother    Other Maternal Grandmother        clotting disorder   Other Other        clotting disorder    Anti-infectives: Anti-infectives (From admission, onward)    None       Current Outpatient  Medications  Medication Sig Dispense Refill   albuterol (VENTOLIN HFA) 108 (90 Base) MCG/ACT inhaler Inhale 1-2 puffs into the lungs every 6 (six) hours as needed for wheezing or shortness of breath.     aspirin 81 MG EC tablet Take 1 tablet (81 mg total) by mouth daily. Swallow whole. 30 tablet 1   atorvastatin (LIPITOR) 40 MG tablet Take 1 tablet (40 mg total) by mouth daily. 30 tablet 1   cholecalciferol (VITAMIN D) 25 MCG (1000 UNIT) tablet Take 1,000 Units by mouth daily.     cyclobenzaprine (FLEXERIL) 10 MG tablet Take by mouth.     metoprolol tartrate (LOPRESSOR) 25 MG tablet Take 1 tablet (25 mg total) by mouth 2 (two) times daily. 60 tablet 1   nitroGLYCERIN (NITROSTAT) 0.4 MG SL tablet Place 1 tablet (0.4 mg total) under the tongue every 5 (five) minutes as needed for chest pain. 30 tablet 1   pantoprazole (PROTONIX) 40 MG tablet Take 40 mg by mouth daily.     ticagrelor (BRILINTA) 90 MG TABS tablet Take 1 tablet (90 mg total) by mouth 2 (two) times daily. 60 tablet 1   No current facility-administered medications for this visit.     Objective: Vital signs in last 24 hours: BP (!) 142/93   Pulse 76   Ht 6' (1.829 m)   Wt (!) 325 lb 2 oz (147.5 kg)   BMI 44.09 kg/m   Intake/Output from previous day: No intake/output data recorded. Intake/Output this shift: @IOTHISSHIFT @   Physical Exam  Lab Results:  Recent Results (from the past 2160 hour(s))  Basic metabolic panel     Status: Abnormal   Collection Time: 05/05/21 12:11 AM  Result Value Ref Range   Sodium 133 (L) 135 - 145 mmol/L   Potassium 3.6 3.5 - 5.1 mmol/L   Chloride 100 98 - 111 mmol/L   CO2 24 22 - 32 mmol/L   Glucose, Bld 234 (H) 70 - 99 mg/dL    Comment: Glucose reference range applies only to samples taken after fasting for at least 8 hours.   BUN 18 6 - 20 mg/dL   Creatinine, Ser 1.68 (H) 0.61 - 1.24 mg/dL   Calcium 8.9 8.9 - 10.3 mg/dL   GFR, Estimated 52 (L) >60 mL/min    Comment:  (NOTE) Calculated using the CKD-EPI Creatinine Equation (2021)    Anion gap 9 5 - 15    Comment: Performed  at [facility], [address], [place], [place] [address]  CBC     Status: Abnormal   Collection Time: 05/05/21 12:11 AM  Result Value Ref Range   WBC 9.0 4.0 - 10.5 K/uL   RBC 5.92 (H) 4.22 - 5.81 MIL/uL   Hemoglobin 16.6 13.0 - 17.0 g/dL   HCT 50.7 39.0 - 52.0 %   MCV 85.6 80.0 - 100.0 fL   MCH 28.0 26.0 - 34.0 pg   MCHC 32.7 30.0 - 36.0 g/dL   RDW 13.6 11.5 - 15.5 %   Platelets 190 150 - 400 K/uL   nRBC 0.0 0.0 - 0.2 %    Comment: Performed at [facility], [address] [place], [place] [address]  Troponin I (High Sensitivity)     Status: Abnormal   Collection Time: 05/05/21 12:11 AM  Result Value Ref Range   Troponin I (High Sensitivity) 4,161 (HH) <18 ng/L    Comment: CRITICAL RESULT CALLED TO, READ BACK BY AND VERIFIED WITH: NICKOLS,K @ 0051 ON 05/05/21 BY JUW (NOTE) Elevated high sensitivity troponin I (hsTnI) values and significant  changes across serial measurements may suggest ACS but many other  chronic and acute conditions are known to elevate hsTnI results.  Refer to the Links section for chest pain algorithms and additional  guidance. Performed at [facility], [address], [place], [place] [address]   Brain natriuretic peptide     Status: None   Collection Time: 05/05/21 12:11 AM  Result Value Ref Range   B Natriuretic Peptide 44.0 0.0 - 100.0 pg/mL    Comment: Performed at [facility], [address] [place], [place] [address]  Hemoglobin A1c     Status: Abnormal   Collection Time: 05/05/21 12:11 AM  Result Value Ref Range   Hgb A1c MFr Bld 6.6 (H) 4.8 - 5.6 %    Comment: (NOTE)         Prediabetes: 5.7 - 6.4         Diabetes: >6.4         Glycemic control for adults with diabetes: <7.0    Mean Plasma Glucose 143 mg/dL    Comment: (NOTE) Performed At: [facility] [place], [place] [id number] Rush Farmer MD  [id number]   TSH     Status: None   Collection Time: 05/05/21 12:11 AM  Result Value Ref Range   TSH 3.716 0.350 - 4.500 uIU/mL    Comment: Performed by a 3rd Generation assay with a functional sensitivity of <=0.01 uIU/mL. Performed at [facility], [address], [place], [place] [address]   Troponin I (High Sensitivity)     Status: Abnormal   Collection Time: 05/05/21  2:04 AM  Result Value Ref Range   Troponin I (High Sensitivity) 4,682 (HH) <18 ng/L    Comment: CRITICAL RESULT CALLED TO, READ BACK BY AND VERIFIED WITH: WALKER,T @ 0305 ON 05/05/21 BY JUW (NOTE) Elevated high sensitivity troponin I (hsTnI) values and significant  changes across serial measurements may suggest ACS but many other  chronic and acute conditions are known to elevate hsTnI results.  Refer to the Links section for chest pain algorithms and additional  guidance. Performed at [facility], [address] [place],  [address]   Troponin I (High Sensitivity)     Status: Abnormal   Collection Time: 05/05/21  7:27 AM  Result Value Ref Range   Troponin I (High Sensitivity) 4,403 (HH) <18 ng/L    Comment: CRITICAL VALUE  NOTED.  VALUE IS CONSISTENT WITH PREVIOUSLY REPORTED AND CALLED VALUE. (NOTE) Elevated high sensitivity troponin I (hsTnI) values and significant  changes across serial measurements may suggest ACS but many other  chronic and acute conditions are known to elevate hsTnI results.  Refer to the Links section for chest pain algorithms and additional  guidance. Performed at [facility], [address] [place], [place] [address]   HIV Antibody (routine testing w rflx)     Status: None   Collection Time: 05/05/21  7:27 AM  Result Value Ref Range   HIV Screen 4th Generation wRfx Non Reactive Non Reactive    Comment: Performed at [facility], [place] [address], [place], [place] [address]  Lipid panel     Status: Abnormal   Collection Time: 05/05/21  7:27 AM  Result Value Ref Range    Cholesterol 158 0 - 200 mg/dL   Triglycerides 227 (H) <150 mg/dL   HDL 32 (L) >40 mg/dL   Total CHOL/HDL Ratio 4.9 RATIO   VLDL 45 (H) 0 - 40 mg/dL   LDL Cholesterol 81 0 - 99 mg/dL    Comment:        Total Cholesterol/HDL:CHD Risk Coronary Heart Disease Risk Table                     Men   Women  1/2 Average Risk   3.4   3.3  Average Risk       5.0   4.4  2 X Average Risk   9.6   7.1  3 X Average Risk  23.4   11.0        Use the calculated Patient Ratio above and the CHD Risk Table to determine the patient's CHD Risk.        ATP III CLASSIFICATION (LDL):  <100     mg/dL   Optimal  100-129  mg/dL   Near or Above                    Optimal  130-159  mg/dL   Borderline  160-189  mg/dL   High  >190     mg/dL   Very High Performed at [facility], [address], [place], [place] [address]   Resp Panel by RT-PCR (Flu A&B, Covid) Nasopharyngeal Swab     Status: None   Collection Time: 05/05/21  7:45 AM   Specimen: Nasopharyngeal Swab; Nasopharyngeal(NP) swabs in vial transport medium  Result Value Ref Range   SARS Coronavirus 2 by RT PCR NEGATIVE NEGATIVE    Comment: (NOTE) SARS-CoV-2 target nucleic acids are NOT DETECTED.  The SARS-CoV-2 RNA is generally detectable in upper respiratory specimens during the acute phase of infection. The lowest concentration of SARS-CoV-2 viral copies this assay can detect is 138 copies/mL. A negative result does not preclude SARS-Cov-2 infection and should not be used as the sole basis for treatment or other patient management decisions. A negative result may occur with  improper specimen collection/handling, submission of specimen other than nasopharyngeal swab, presence of viral mutation(s) within the areas targeted by this assay, and inadequate number of viral copies(<138 copies/mL). A negative result must be combined with clinical observations, patient history, and epidemiological information. The expected result is Negative.  Fact  Sheet for Patients:  EntrepreneurPulse.com.au  Fact Sheet for Healthcare Providers:  IncredibleEmployment.be  This test is no t yet approved or cleared by the [place] FDA and  has been authorized for detection  and/or diagnosis of SARS-CoV-2 by FDA under an Emergency Use Authorization (EUA). This EUA will remain  in effect (meaning this test can be used) for the duration of the COVID-19 declaration under Section 564(b)(1) of the Act, 21 U.S.C.section 360bbb-3(b)(1), unless the authorization is terminated  or revoked sooner.       Influenza A by PCR NEGATIVE NEGATIVE   Influenza B by PCR NEGATIVE NEGATIVE    Comment: (NOTE) The Xpert Xpress SARS-CoV-2/FLU/RSV plus assay is intended as an aid in the diagnosis of influenza from Nasopharyngeal swab specimens and should not be used as a sole basis for treatment. Nasal washings and aspirates are unacceptable for Xpert Xpress SARS-CoV-2/FLU/RSV testing.  Fact Sheet for Patients: EntrepreneurPulse.com.au  Fact Sheet for Healthcare Providers: IncredibleEmployment.be  This test is not yet approved or cleared by the [place] FDA and has been authorized for detection and/or diagnosis of SARS-CoV-2 by FDA under an Emergency Use Authorization (EUA). This EUA will remain in effect (meaning this test can be used) for the duration of the COVID-19 declaration under Section 564(b)(1) of the Act, 21 U.S.C. section 360bbb-3(b)(1), unless the authorization is terminated or revoked.  Performed at [facility], [address], [place], [place] [address]   Heparin level (unfractionated)     Status: Abnormal   Collection Time: 05/05/21  9:15 AM  Result Value Ref Range   Heparin Unfractionated <0.10 (L) 0.30 - 0.70 IU/mL    Comment: (NOTE) The clinical reportable range upper limit is being lowered to >1.10 to align with the FDA approved guidance for the current  laboratory assay.  If heparin results are below expected values, and patient dosage has  been confirmed, suggest follow up testing of antithrombin III levels. Performed at [facility], [address], [place], [place] [address]   Troponin I (High Sensitivity)     Status: Abnormal   Collection Time: 05/05/21  9:15 AM  Result Value Ref Range   Troponin I (High Sensitivity) 3,829 (HH) <18 ng/L    Comment: CRITICAL VALUE NOTED.  VALUE IS CONSISTENT WITH PREVIOUSLY REPORTED AND CALLED VALUE. (NOTE) Elevated high sensitivity troponin I (hsTnI) values and significant  changes across serial measurements may suggest ACS but many other  chronic and acute conditions are known to elevate hsTnI results.  Refer to the Links section for chest pain algorithms and additional  guidance. Performed at [facility], [address], [place], [place] [address]   Basic metabolic panel     Status: Abnormal   Collection Time: 05/05/21  9:15 AM  Result Value Ref Range   Sodium 134 (L) 135 - 145 mmol/L   Potassium 4.1 3.5 - 5.1 mmol/L   Chloride 101 98 - 111 mmol/L   CO2 24 22 - 32 mmol/L   Glucose, Bld 141 (H) 70 - 99 mg/dL    Comment: Glucose reference range applies only to samples taken after fasting for at least 8 hours.   BUN 16 6 - 20 mg/dL   Creatinine, Ser 1.40 (H) 0.61 - 1.24 mg/dL   Calcium 8.5 (L) 8.9 - 10.3 mg/dL   GFR, Estimated >60 >60 mL/min    Comment: (NOTE) Calculated using the CKD-EPI Creatinine Equation (2021)    Anion gap 9 5 - 15    Comment: Performed at [facility], [address], [place], [place] [address]  POCT Activated clotting time     Status: None   Collection Time: 05/05/21  4:41 PM  Result Value Ref Range   Activated Clotting  Time 300 seconds  Basic metabolic panel     Status: Abnormal   Collection Time: 05/06/21 12:21 AM  Result Value Ref Range   Sodium 132 (L) 135 - 145 mmol/L   Potassium 5.3 (H) 3.5 - 5.1 mmol/L    Comment: HEMOLYSIS AT THIS LEVEL MAY AFFECT  RESULT   Chloride 98 98 - 111 mmol/L   CO2 25 22 - 32 mmol/L   Glucose, Bld 130 (H) 70 - 99 mg/dL    Comment: Glucose reference range applies only to samples taken after fasting for at least 8 hours.   BUN 16 6 - 20 mg/dL   Creatinine, Ser 1.46 (H) 0.61 - 1.24 mg/dL   Calcium 8.7 (L) 8.9 - 10.3 mg/dL   GFR, Estimated >60 >60 mL/min    Comment: (NOTE) Calculated using the CKD-EPI Creatinine Equation (2021)    Anion gap 9 5 - 15    Comment: Performed at [facility] [address], [place], [place] [address]  CBC     Status: Abnormal   Collection Time: 05/06/21 12:21 AM  Result Value Ref Range   WBC 8.4 4.0 - 10.5 K/uL   RBC 5.88 (H) 4.22 - 5.81 MIL/uL   Hemoglobin 16.1 13.0 - 17.0 g/dL   HCT 49.9 39.0 - 52.0 %   MCV 84.9 80.0 - 100.0 fL   MCH 27.4 26.0 - 34.0 pg   MCHC 32.3 30.0 - 36.0 g/dL   RDW 13.6 11.5 - 15.5 %   Platelets 198 150 - 400 K/uL   nRBC 0.0 0.0 - 0.2 %    Comment: Performed at [facility], [place] [address] [place], [place] [address]  Heparin level (unfractionated)     Status: Abnormal   Collection Time: 05/06/21  8:04 AM  Result Value Ref Range   Heparin Unfractionated 0.10 (L) 0.30 - 0.70 IU/mL    Comment: (NOTE) The clinical reportable range upper limit is being lowered to >1.10 to align with the FDA approved guidance for the current laboratory assay.  If heparin results are below expected values, and patient dosage has  been confirmed, suggest follow up testing of antithrombin III levels. Performed at [facility], [place] [address], [place], [place] [address]   Potassium     Status: None   Collection Time: 05/06/21  8:04 AM  Result Value Ref Range   Potassium 4.2 3.5 - 5.1 mmol/L    Comment: Performed at [facility] [address] [place], [place] [address]  Brain natriuretic peptide     Status: None   Collection Time: 05/06/21  9:29 AM  Result Value Ref Range   B Natriuretic Peptide 28.3 0.0 - 100.0 pg/mL    Comment:  Performed at [facility] [address], [place], [place] [address]  ECHOCARDIOGRAM COMPLETE     Status: None   Collection Time: 05/06/21  1:55 PM  Result Value Ref Range   Weight 5,160.53 oz   Height 72 in   BP 133/78 mmHg   S' Lateral 2.50 cm   Area-P 1/2 2.80 cm2  Glucose, capillary     Status: Abnormal   Collection Time: 05/06/21  4:13 PM  Result Value Ref Range   Glucose-Capillary 116 (H) 70 - 99 mg/dL    Comment: Glucose reference range applies only to samples taken after fasting for at least 8 hours.  Blood gas, arterial     Status: Abnormal   Collection Time: 05/06/21  4:50 PM  Result Value Ref Range   FIO2 21.00    pH, Arterial 7.400 7.350 - 7.450   pCO2 arterial 42.1 32.0 - 48.0 mmHg   pO2, Arterial 75.3 (L) 83.0 - 108.0 mmHg   Bicarbonate 25.5 20.0 - 28.0 mmol/L   Acid-Base Excess 1.2 0.0 - 2.0 mmol/L   O2 Saturation 94.6 %   Patient temperature 37.0    Collection site RIGHT RADIAL    Drawn by [phone]    Sample type ARTERIAL DRAW    Allens test (pass/fail) PASS PASS    Comment: Performed at [facility] [address], [place], [place] [address]  Glucose, capillary     Status: Abnormal   Collection Time: 05/06/21  9:33 PM  Result Value Ref Range   Glucose-Capillary 144 (H) 70 - 99 mg/dL    Comment: Glucose reference range applies only to samples taken after fasting for at least 8 hours.   Comment 1 Document in Chart   CBC     Status: Abnormal   Collection Time: 05/07/21  1:48 AM  Result Value Ref Range   WBC 9.1 4.0 - 10.5 K/uL   RBC 6.01 (H) 4.22 - 5.81 MIL/uL   Hemoglobin 17.0 13.0 - 17.0 g/dL   HCT 52.8 (H) 39.0 - 52.0 %   MCV 87.9 80.0 - 100.0 fL   MCH 28.3 26.0 - 34.0 pg   MCHC 32.2 30.0 - 36.0 g/dL   RDW 13.7 11.5 - 15.5 %   Platelets 178 150 - 400 K/uL   nRBC 0.0 0.0 - 0.2 %    Comment: Performed at [facility], [place] [address], [place], [place] [address]  Differential     Status: None   Collection Time: 05/07/21  1:48 AM   Result Value Ref Range   Neutrophils Relative % 64 %   Neutro Abs 5.9 1.7 - 7.7 K/uL   Lymphocytes Relative 24 %   Lymphs Abs 2.2 0.7 - 4.0 K/uL   Monocytes Relative 9 %   Monocytes Absolute 0.8 0.1 - 1.0 K/uL   Eosinophils Relative 2 %   Eosinophils Absolute 0.2 0.0 - 0.5 K/uL   Basophils Relative 0 %   Basophils Absolute 0.0 0.0 - 0.1 K/uL   Immature Granulocytes 1 %   Abs Immature Granulocytes 0.07 0.00 - 0.07 K/uL    Comment: Performed at [facility], [place] [address] [place], [place] [address]  Basic metabolic panel     Status: Abnormal   Collection Time: 05/07/21  1:48 AM  Result Value Ref Range   Sodium 137 135 - 145 mmol/L   Potassium 3.9 3.5 - 5.1 mmol/L   Chloride 97 (L) 98 - 111 mmol/L   CO2 27 22 - 32 mmol/L   Glucose, Bld 128 (H) 70 - 99 mg/dL    Comment: Glucose reference range applies only to samples taken after fasting for at least 8 hours.   BUN 20 6 - 20 mg/dL   Creatinine, Ser 1.49 (H) 0.61 - 1.24 mg/dL   Calcium 9.4 8.9 - 10.3 mg/dL   GFR, Estimated 60 (L) >60 mL/min    Comment: (NOTE) Calculated using the CKD-EPI Creatinine Equation (2021)    Anion gap 13 5 - 15    Comment: Performed at [facility] [address], [place], [place] [address]  Magnesium     Status: None   Collection Time: 05/07/21  1:48 AM  Result Value Ref Range  Magnesium 2.2 1.7 - 2.4 mg/dL    Comment: Performed at [facility], [place] [address], [place], [place] [address]  Glucose, capillary     Status: Abnormal   Collection Time: 05/07/21  6:24 AM  Result Value Ref Range   Glucose-Capillary 132 (H) 70 - 99 mg/dL    Comment: Glucose reference range applies only to samples taken after fasting for at least 8 hours.   Comment 1 Document in Chart   Glucose, capillary     Status: Abnormal   Collection Time: 05/07/21 11:39 AM  Result Value Ref Range   Glucose-Capillary 122 (H) 70 - 99 mg/dL    Comment: Glucose reference range applies only to samples taken after  fasting for at least 8 hours.   UA is clear  Studies/Results: I have reviewed the recent ER records, labs and CT films and reports and compared them with the prior studies.     Assessment/Plan: Renal stone.   The stones were seen on [place] but not on a CT in 6/21 but he has several small non-obstructing bilateral renal stones on his recent CT.   He has been undergoing a metabolic w/u for stones with Dr. Theador Hawthorne.   I will just have him return in a year with a renal US.    Frequency and urgency.   His PSA is low at 1 and the prostate was small on CT with some right prostatic stones.  His bladder was not distended on the prior US.   No orders of the defined types were placed in this encounter.    Orders Placed This Encounter  Procedures   Ultrasound renal complete    Standing Status:   Future    Standing Expiration Date:   05/15/2022    Order Specific Question:   Reason for Exam (SYMPTOM  OR DIAGNOSIS REQUIRED)    Answer:   renal stones    Order Specific Question:   Preferred imaging location?    Answer:   [facility]     Return in about 1 year (around 05/15/2022) for with renal US. .    CC: Dr. Ulice Weeks and Dr. Gar Ponto.      Irine Seal 05/15/2021 [phone]

## 2021-05-30 ENCOUNTER — Other Ambulatory Visit: Payer: Self-pay

## 2021-05-30 ENCOUNTER — Ambulatory Visit (INDEPENDENT_AMBULATORY_CARE_PROVIDER_SITE_OTHER): Payer: Managed Care, Other (non HMO) | Admitting: Pulmonary Disease

## 2021-05-30 ENCOUNTER — Encounter (HOSPITAL_COMMUNITY): Payer: Self-pay

## 2021-05-30 ENCOUNTER — Encounter: Payer: Self-pay | Admitting: Pulmonary Disease

## 2021-05-30 ENCOUNTER — Encounter (HOSPITAL_COMMUNITY)
Admission: RE | Admit: 2021-05-30 | Discharge: 2021-05-30 | Disposition: A | Discharge status: Home / Self Care | Payer: Managed Care, Other (non HMO) | Source: Ambulatory Visit | Attending: Cardiology | Admitting: Cardiology

## 2021-05-30 VITALS — BP 108/80 | HR 87 | Ht 72.0 in

## 2021-05-30 VITALS — BP 128/80 | HR 85 | Temp 97.5°F | Ht 72.0 in | Wt 319.2 lb

## 2021-05-30 DIAGNOSIS — I214 Non-ST elevation (NSTEMI) myocardial infarction: Secondary | ICD-10-CM | POA: Diagnosis present

## 2021-05-30 DIAGNOSIS — R590 Localized enlarged lymph nodes: Secondary | ICD-10-CM

## 2021-05-30 DIAGNOSIS — R0602 Shortness of breath: Secondary | ICD-10-CM

## 2021-05-30 DIAGNOSIS — J9601 Acute respiratory failure with hypoxia: Secondary | ICD-10-CM

## 2021-05-30 NOTE — Patient Instructions (Signed)
Schedule PFTs Ambulatory sat Referral to thoracic surgery for mediastinal Lymph node biopsy

## 2021-05-30 NOTE — Progress Notes (Signed)
Cardiac Individual Treatment Plan  Patient Details  Name: Zachary Weeks MRN: [id number] Date of Birth: October 25, 1981 Referring Provider:   Flowsheet Row CARDIAC REHAB PHASE II ORIENTATION from 05/30/2021 in [place]  Referring Provider Dr. Grandville Silos       Initial Encounter Date:  Flowsheet Row CARDIAC REHAB PHASE II ORIENTATION from 05/30/2021 in [place]  Date 05/30/21       Visit Diagnosis: NSTEMI (non-ST elevated myocardial infarction) [facility])  Patient's Home Medications on Admission:  Current Outpatient Medications:    albuterol (VENTOLIN HFA) 108 (90 Base) MCG/ACT inhaler, Inhale 1-2 puffs into the lungs every 6 (six) hours as needed for wheezing or shortness of breath., Disp: , Rfl:    aspirin 81 MG EC tablet, Take 1 tablet (81 mg total) by mouth daily. Swallow whole., Disp: 30 tablet, Rfl: 1   atorvastatin (LIPITOR) 40 MG tablet, Take 1 tablet (40 mg total) by mouth daily., Disp: 30 tablet, Rfl: 1   cholecalciferol (VITAMIN D) 25 MCG (1000 UNIT) tablet, Take 1,000 Units by mouth daily., Disp: , Rfl:    cyclobenzaprine (FLEXERIL) 10 MG tablet, Take by mouth., Disp: , Rfl:    metoprolol tartrate (LOPRESSOR) 25 MG tablet, Take 1 tablet (25 mg total) by mouth 2 (two) times daily., Disp: 60 tablet, Rfl: 1   nitroGLYCERIN (NITROSTAT) 0.4 MG SL tablet, Place 1 tablet (0.4 mg total) under the tongue every 5 (five) minutes as needed for chest pain., Disp: 30 tablet, Rfl: 1   pantoprazole (PROTONIX) 40 MG tablet, Take 40 mg by mouth daily., Disp: , Rfl:    ticagrelor (BRILINTA) 90 MG TABS tablet, Take 1 tablet (90 mg total) by mouth 2 (two) times daily., Disp: 60 tablet, Rfl: 1  Past Medical History: Past Medical History:  Diagnosis Date   Asthma    DVT (deep venous thrombosis) (HCC)    Dyspnea    at times - no known reason.    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   Pneumonia    65- 73 years of age    Tobacco Use: Social History   Tobacco Use  Smoking Status Former   Packs/day: 1.00   Years: 3.00   Pack years: 3.00   Types: Cigarettes   Quit date: 1999   Years since quitting: 23.5  Smokeless Tobacco Current   Types: Chew, Snuff    Labs: Recent Review Flowsheet Data     Labs for ITP Cardiac and Pulmonary Rehab Latest Ref Rng & Units 05/05/2021 05/06/2021   Cholestrol 0 - 200 mg/dL 158 -   LDLCALC 0 - 99 mg/dL 81 -   HDL >40 mg/dL 32(L) -   Trlycerides <150 mg/dL 227(H) -   Hemoglobin A1c 4.8 - 5.6 % 6.6(H) -   PHART 7.350 - 7.450 - 7.400   PCO2ART 32.0 - 48.0 mmHg - 42.1   HCO3 20.0 - 28.0 mmol/L - 25.5   O2SAT % - 94.6       Capillary Blood Glucose: Lab Results  Component Value Date   GLUCAP 122 (H) 05/07/2021   GLUCAP 132 (H) 05/07/2021   GLUCAP 144 (H) 05/06/2021   GLUCAP 116 (H) 05/06/2021     Exercise Target Goals: Exercise Program Goal: Individual exercise prescription set using results from initial 6 min walk test and THRR while considering  patient's activity barriers and safety.  Exercise Prescription Goal: Starting with aerobic activity 30 plus minutes a day, 3 days per week for initial exercise prescription. Provide home exercise prescription and guidelines that participant acknowledges understanding prior to discharge.  Activity Barriers & Risk Stratification:  Activity Barriers & Cardiac Risk Stratification - 05/30/21 1323       Activity Barriers & Cardiac Risk Stratification   Activity Barriers Shortness of Breath    Cardiac Risk Stratification High             6 Minute Walk:  6 Minute Walk     Row Name 05/30/21 1458         6 Minute Walk   Phase Initial     Distance 1100 feet     Walk Time 6 minutes     # of Rest Breaks 0     MPH 2.08     METS 3.17     RPE 9     VO2 Peak 11.1     Symptoms No     Resting HR 87 bpm     Resting BP 108/80     Resting Oxygen  Saturation  92 %     Exercise Oxygen Saturation  during 6 min walk 92 %     Max Ex. HR 100 bpm     Max Ex. BP 140/90     2 Minute Post BP 124/82              Oxygen Initial Assessment:   Oxygen Re-Evaluation:   Oxygen Discharge (Final Oxygen Re-Evaluation):   Initial Exercise Prescription:  Initial Exercise Prescription - 05/30/21 1400       Date of Initial Exercise RX and Referring Provider   Date 05/30/21    Referring Provider Dr. Grandville Silos    Expected Discharge Date 08/22/21      Treadmill   MPH 1    Grade 0    Minutes 17      NuStep   Level 1    SPM 80    Minutes 22      Prescription Details   Frequency (times per week) 3    Duration Progress to 30 minutes of continuous aerobic without signs/symptoms of physical distress      Intensity   THRR 40-80% of Max Heartrate 71-142    Ratings of Perceived Exertion 11-13    Perceived Dyspnea 0-4      Resistance Training   Training Prescription Yes    Weight 5 lbs    Reps 10-15             Perform Capillary Blood Glucose checks as needed.  Exercise Prescription Changes:   Exercise Comments:   Exercise Goals and Review:   Exercise Goals     Row Name 05/30/21 1500             Exercise Goals   Increase Physical Activity Yes       Intervention Provide advice, education, support and counseling about physical activity/exercise needs.;Develop an individualized exercise prescription for aerobic and resistive training based on initial evaluation findings, risk stratification, comorbidities and participant's personal goals.       Expected Outcomes Short Term: Attend rehab on a regular basis to increase amount of physical activity.;Long Term: Add in home exercise to make exercise part of routine and to increase amount of physical activity.;Long Term: Exercising regularly at least 3-5 days a week.       Increase Strength and Stamina Yes       Intervention  Provide advice, education, support and counseling  about physical activity/exercise needs.;Develop an individualized exercise prescription for aerobic and resistive training based on initial evaluation findings, risk stratification, comorbidities and participant's personal goals.       Expected Outcomes Short Term: Increase workloads from initial exercise prescription for resistance, speed, and METs.;Short Term: Perform resistance training exercises routinely during rehab and add in resistance training at home;Long Term: Improve cardiorespiratory fitness, muscular endurance and strength as measured by increased METs and functional capacity (6MWT)       Able to understand and use rate of perceived exertion (RPE) scale Yes       Intervention Provide education and explanation on how to use RPE scale       Expected Outcomes Short Term: Able to use RPE daily in rehab to express subjective intensity level;Long Term:  Able to use RPE to guide intensity level when exercising independently       Knowledge and understanding of Target Heart Rate Range (THRR) Yes       Intervention Provide education and explanation of THRR including how the numbers were predicted and where they are located for reference       Expected Outcomes Short Term: Able to state/look up THRR;Long Term: Able to use THRR to govern intensity when exercising independently;Short Term: Able to use daily as guideline for intensity in rehab       Able to check pulse independently Yes       Intervention Provide education and demonstration on how to check pulse in carotid and radial arteries.;Review the importance of being able to check your own pulse for safety during independent exercise       Expected Outcomes Short Term: Able to explain why pulse checking is important during independent exercise;Long Term: Able to check pulse independently and accurately       Understanding of Exercise Prescription Yes       Intervention Provide education, explanation, and written materials on patient's individual  exercise prescription       Expected Outcomes Short Term: Able to explain program exercise prescription;Long Term: Able to explain home exercise prescription to exercise independently                Exercise Goals Re-Evaluation :    Discharge Exercise Prescription (Final Exercise Prescription Changes):   Nutrition:  Target Goals: Understanding of nutrition guidelines, daily intake of sodium 1500mg , cholesterol 200mg , calories 30% from fat and 7% or less from saturated fats, daily to have 5 or more servings of fruits and vegetables.  Biometrics:  Pre Biometrics - 05/30/21 1501       Pre Biometrics   Height 6' (1.829 m)    Waist Circumference 57 inches    Hip Circumference 51 inches    Waist to Hip Ratio 1.12 %    Triceps Skinfold 27 mm    % Body Fat 42.4 %    Grip Strength 57.9 kg    Flexibility 0 in    Single Leg Stand 27.64 seconds              Nutrition Therapy Plan and Nutrition Goals:   Nutrition Assessments:  Nutrition Assessments - 05/30/21 1326       MEDFICTS Scores   Pre Score 50            MEDIFICTS Score Key: ?70 Need to make dietary changes  40-70 Heart Healthy Diet ? 40 Therapeutic Level Cholesterol Diet   Picture Your Plate Scores: <06 Unhealthy dietary pattern with  much room for improvement. 41-50 Dietary pattern unlikely to meet recommendations for good health and room for improvement. 51-60 More healthful dietary pattern, with some room for improvement.  >60 Healthy dietary pattern, although there may be some specific behaviors that could be improved.    Nutrition Goals Re-Evaluation:   Nutrition Goals Discharge (Final Nutrition Goals Re-Evaluation):   Psychosocial: Target Goals: Acknowledge presence or absence of significant depression and/or stress, maximize coping skills, provide positive support system. Participant is able to verbalize types and ability to use techniques and skills needed for reducing stress and  depression.  Initial Review & Psychosocial Screening:  Initial Psych Review & Screening - 05/30/21 1328       Initial Review   Current issues with None Identified      Family Dynamics   Good Support System? Yes    Comments His support system includes his family. He also has several friends who check in on him regularly.      Barriers   Psychosocial barriers to participate in program There are no identifiable barriers or psychosocial needs.      Screening Interventions   Interventions Encouraged to exercise    Expected Outcomes Long Term Goal: Stressors or current issues are controlled or eliminated.             Quality of Life Scores:  Quality of Life - 05/30/21 1454       Quality of Life   Select Quality of Life      Quality of Life Scores   Health/Function Pre 17.43 %    Socioeconomic Pre 19.56 %    Psych/Spiritual Pre 23.57 %    Family Pre 17.4 %    GLOBAL Pre 19.19 %            Scores of 19 and below usually indicate a poorer quality of life in these areas.  A difference of  2-3 points is a clinically meaningful difference.  A difference of 2-3 points in the total score of the Quality of Life Index has been associated with significant improvement in overall quality of life, self-image, physical symptoms, and general health in studies assessing change in quality of life.  PHQ-9: Recent Review Flowsheet Data     Depression screen [facility] 2/9 05/30/2021   Decreased Interest 0   Down, Depressed, Hopeless 0   PHQ - 2 Score 0   Altered sleeping 0   Tired, decreased energy 0   Change in appetite 0   Feeling bad or failure about yourself  0   Trouble concentrating 0   Moving slowly or fidgety/restless 0   Suicidal thoughts 0   PHQ-9 Score 0   Difficult doing work/chores Not difficult at all      Interpretation of Total Score  Total Score Depression Severity:  1-4 = Minimal depression, 5-9 = Mild depression, 10-14 = Moderate depression, 15-19 = Moderately  severe depression, 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 Pt has no psychosocial issues. He scored a 0 on his PHQ-9. He does report that finances are a bit tight now, but he is not stressed about it. He has now been cleared by both cardiology and pulmonary to return to work, which he will on Monday. He reports  that he has a great support system with his family and his friends. Since his hospitial stay from his NSTEMI he has cleaned up his diet and has cut out friend foods and is limiting his consumption of sugary beverages. His goals while in the program are to continue to lose weight and decrease his SOB with exertion. He is eager to start the program and meet his goals.    Expected Outcomes The pt will continue to not have any identfiable psychosocial issues.    Continue Psychosocial Services  No Follow up required             Psychosocial Re-Evaluation:   Psychosocial Discharge (Final Psychosocial Re-Evaluation):   Vocational Rehabilitation: Provide vocational rehab assistance to qualifying candidates.   Vocational Rehab Evaluation & Intervention:  Vocational Rehab - 05/30/21 1333       Initial Vocational Rehab Evaluation & Intervention   Assessment shows need for Vocational Rehabilitation No             Education: Education Goals: Education classes will be provided on a weekly basis, covering required topics. Participant will state understanding/return demonstration of topics presented.  Learning Barriers/Preferences:  Learning Barriers/Preferences - 05/30/21 1329       Learning Barriers/Preferences   Learning Barriers None    Learning Preferences Skilled Demonstration;Individual Instruction              Education Topics: Hypertension, Hypertension Reduction -Define heart disease and high blood pressure. Discus how high blood pressure affects the body and ways to reduce high blood pressure.   Exercise and Your Heart -Discuss why it is important to exercise, the FITT principles of exercise, normal and abnormal responses to exercise, and how to exercise safely.   Angina -Discuss definition of angina, causes of angina, treatment of angina, and how to decrease risk of having angina.   Cardiac Medications -Review what the following cardiac medications are used for, how they affect the body, and side effects that may occur when taking the medications.  Medications include Aspirin, Beta blockers, calcium channel blockers, ACE Inhibitors, angiotensin receptor blockers, diuretics, digoxin, and antihyperlipidemics.   Congestive Heart Failure -Discuss the definition of CHF, how to live with CHF, the signs and symptoms of CHF, and how keep track of weight and sodium intake.   Heart Disease and Intimacy -Discus the effect sexual activity has on the heart, how changes occur during intimacy as we age, and safety during sexual activity.   Smoking Cessation / COPD -Discuss different methods to quit smoking, the health benefits of quitting smoking, and the definition of COPD.   Nutrition I: Fats -Discuss the types of cholesterol, what cholesterol does to the heart, and how cholesterol levels can be controlled.   Nutrition II: Labels -Discuss the different components of food labels and how to read food label   Heart Parts/Heart Disease and PAD -Discuss the anatomy of the heart, the pathway of blood circulation through the heart, and these are affected by heart disease.   Stress I: Signs and Symptoms -Discuss the causes of stress, how stress may lead to anxiety and depression, and ways to limit stress.   Stress II: Relaxation -Discuss different types of relaxation techniques to  limit stress.   Warning Signs of Stroke / TIA -Discuss definition of a stroke, what the signs and symptoms are of a stroke, and how to identify when someone is having stroke.   Knowledge Questionnaire Score:  Knowledge Questionnaire Score - 05/30/21 1329  Knowledge Questionnaire Score   Pre Score 20/24             Core Components/Risk Factors/Patient Goals at Admission:  Personal Goals and Risk Factors at Admission - 05/30/21 1333       Core Components/Risk Factors/Patient Goals on Admission    Weight Management Yes;Obesity;Weight Maintenance;Weight Loss    Intervention Weight Management: Develop a combined nutrition and exercise program designed to reach desired caloric intake, while maintaining appropriate intake of nutrient and fiber, sodium and fats, and appropriate energy expenditure required for the weight goal.;Weight Management: Provide education and appropriate resources to help participant work on and attain dietary goals.;Weight Management/Obesity: Establish reasonable short term and long term weight goals.;Obesity: Provide education and appropriate resources to help participant work on and attain dietary goals.    Expected Outcomes Short Term: Continue to assess and modify interventions until short term weight is achieved;Long Term: Adherence to nutrition and physical activity/exercise program aimed toward attainment of established weight goal;Weight Maintenance: Understanding of the daily nutrition guidelines, which includes 25-35% calories from fat, 7% or less cal from saturated fats, less than 200mg  cholesterol, less than 1.5gm of sodium, & 5 or more servings of fruits and vegetables daily;Weight Loss: Understanding of general recommendations for a balanced deficit meal plan, which promotes 1-2 lb weight loss per week and includes a negative energy balance of [phone] kcal/d;Understanding recommendations for meals to include 15-35% energy as protein, 25-35% energy from  fat, 35-60% energy from carbohydrates, less than 200mg  of dietary cholesterol, 20-35 gm of total fiber daily;Understanding of distribution of calorie intake throughout the day with the consumption of 4-5 meals/snacks    Improve shortness of breath with ADL's Yes    Intervention Provide education, individualized exercise plan and daily activity instruction to help decrease symptoms of SOB with activities of daily living.    Expected Outcomes Short Term: Improve cardiorespiratory fitness to achieve a reduction of symptoms when performing ADLs;Long Term: Be able to perform more ADLs without symptoms or delay the onset of symptoms    Heart Failure Yes    Intervention Provide a combined exercise and nutrition program that is supplemented with education, support and counseling about heart failure. Directed toward relieving symptoms such as shortness of breath, decreased exercise tolerance, and extremity edema.    Expected Outcomes Improve functional capacity of life;Short term: Attendance in program 2-3 days a week with increased exercise capacity. Reported lower sodium intake. Reported increased fruit and vegetable intake. Reports medication compliance.;Short term: Daily weights obtained and reported for increase. Utilizing diuretic protocols set by physician.;Long term: Adoption of self-care skills and reduction of barriers for early signs and symptoms recognition and intervention leading to self-care maintenance.    Hypertension Yes    Intervention Provide education on lifestyle modifcations including regular physical activity/exercise, weight management, moderate sodium restriction and increased consumption of fresh fruit, vegetables, and low fat dairy, alcohol moderation, and smoking cessation.;Monitor prescription use compliance.    Expected Outcomes Short Term: Continued assessment and intervention until BP is < 140/15mm HG in hypertensive participants. < 130/36mm HG in hypertensive participants with  diabetes, heart failure or chronic kidney disease.;Long Term: Maintenance of blood pressure at goal levels.    Lipids Yes    Intervention Provide education and support for participant on nutrition & aerobic/resistive exercise along with prescribed medications to achieve LDL 70mg , HDL >40mg .    Expected Outcomes Short Term: Participant states understanding of desired cholesterol values and is compliant with medications prescribed. Participant is following exercise  prescription and nutrition guidelines.;Long Term: Cholesterol controlled with medications as prescribed, with individualized exercise RX and with personalized nutrition plan. Value goals: LDL < 70mg , HDL > 40 mg.             Core Components/Risk Factors/Patient Goals Review:    Core Components/Risk Factors/Patient Goals at Discharge (Final Review):    ITP Comments:   Comments: Patient arrived for 1st visit/orientation/education at 1230. Patient was referred to CR by Dr. Grandville Silos (followed post discharge by Dr. Virgina Jock) due to NSTEMI (I21.4). During orientation advised patient on arrival and appointment times what to wear, what to do before, during and after exercise. Reviewed attendance and class policy.  Pt is scheduled to return Cardiac Rehab on 06/02/2021 at 0815. Pt was advised to come to class 15 minutes before class starts.  Discussed RPE/Dpysnea scales. Patient participated in warm up stretches. Patient was able to complete 6 minute walk test.  Telemetry:NSR Patient was measured for the equipment. Discussed equipment safety with patient. Took patient pre-anthropometric measurements. Patient finished visit at 1415.

## 2021-05-30 NOTE — Assessment & Plan Note (Signed)
Cause of his transient hypoxia is not clear, this was not related to fluid overload/cor pulmonale, ABG did not suggest obesity hypoventilation.  He really did not seem to have significant bronchospasm during his hospital stay.  He was also noted to be mildly hypoxic during his office visits from 92 to 94%.  I am still suspicious for sarcoidosis although his CT scan did not show significant parenchymal involvement.  This appears to have resolved, he did not desaturate on ambulation today.  He is cleared to go back to work

## 2021-05-30 NOTE — Assessment & Plan Note (Signed)
He has mediastinal and hilar lymphadenopathy that has been stable since December 2020.  I am hesitant to give him empiric steroids obviously for fear of weight gain.  I discussed a wait and watch approach and he would like to pursue a diagnosis.  EBUS was nondiagnostic in the past so we will refer him for mediastinoscopy biopsy

## 2021-05-30 NOTE — Progress Notes (Signed)
With good  Subjective:    Patient ID: ARN MCOMBER, male    DOB: 1981-04-09, 39 y.o.   MRN: [id number]  HPI  40 yo obese Man  for FU of mediastinal and hilar lymphadenopathy (first noted 10/2019 ) & OSA   PMH - RLE DVT in 10/2019   Underwent hematology evaluation and detailed hypercoagulable work-up which was mostly negative except PCR-ABL positive .  bone marrow biopsy 02/2020 neg  Family history of pulmonary fibrosis in his mother  Office visit 11/2020 reviewed.  He was started on CPAP,Luna machine made good results.  He was admitted 6/12-6/15 for non-STEMI , cardiac cath showed nonobstructive CAD, no treatable lesion identified, placed on DAPT with aspirin and Brilinta .  He was noted to be hypoxic, echo showed normal LV function, BNP was normal.  He required up to 4 L nasal cannula post LHC ABG was 7.4 0/22/70 5/95% on room air on 6/14 He was started on Dulera I have reviewed his hospital course and discharge summary  6/21 his cardiologist cleared him for return to work from cardiology standpoint  He states that he still needs clearance from [place] to go back to work  His breathing is doing better.  Oxygen saturation is 94% and he does not desaturate on walking.  He still feels some heaviness in his chest, like he is up against a wall, no cough or wheezing.  In the past, steroid inhaler made him have an asthma attack  He has lost 16 pounds in the last year   Significant tests/ events reviewed HST 11/2020 [facility] severe OSA TBNA 10/2020 neg, scant lymphoid tissue   10/18/20 Ambulatory saturation-oxygen saturation was 92% on room air at rest and desaturated 91% on walking    04/2021 CT angiogram chest with diffuse nonspecific mediastinal and hilar adenopathy, minimal scarring in the right middle lobe, dependent atelectasis focal subpleural opacity in the right lower lobe likely additional atelectasis or scarring  10/2019 CT angio chest >> Mild mediastinal and bilateral hilar adenopathy,  Prevascular lymph node has a short axis diameter of 14 mm. Subcarinal lymph node has a short axis diameter of 19 mm. Right paratracheal lymph node has a short axis diameter of 12 mm. No axillary adenopathy. 5 mm right lower lobe peripheral nodule   PET 04/2020 >> Mediastinal and bilateral hilar lymphadenopathy with associated increased metabolic activity, favored to represent benign, reactive changes.   CT chest W con 08/2020 Unchanged enlarged mediastinal and bilateral hilar lymph nodes,  which remain nonspecific  Autoimmune Work up from [employer] 01/2020  Rheumatoid factor was negative quantifiron  negative ANA was negative angiotensin converting enzyme was negative anti-SSA antibody and anti-SSB antibody were negative ESR was 17 mm/hr factor V Leiden test was negative     Review of Systems neg for any significant sore throat, dysphagia, itching, sneezing, nasal congestion or excess/ purulent secretions, fever, chills, sweats, unintended wt loss, pleuritic or exertional cp, hempoptysis, orthopnea pnd or change in chronic leg swelling. Also denies presyncope, palpitations, heartburn, abdominal pain, nausea, vomiting, diarrhea or change in bowel or urinary habits, dysuria,hematuria, rash, arthralgias, visual complaints, headache, numbness weakness or ataxia.     Objective:   Physical Exam  Gen. Pleasant, obese, in no distress ENT - no lesions, no post nasal drip Neck: No JVD, no thyromegaly, no carotid bruits Lungs: no use of accessory muscles, no dullness to percussion, decreased without rales or rhonchi  Cardiovascular: Rhythm regular, heart sounds  normal, no murmurs or gallops,  no peripheral edema Musculoskeletal: No deformities, no cyanosis or clubbing , no tremors       Assessment & Plan:

## 2021-06-02 ENCOUNTER — Other Ambulatory Visit: Payer: Self-pay

## 2021-06-02 ENCOUNTER — Encounter (HOSPITAL_COMMUNITY)
Admission: RE | Admit: 2021-06-02 | Discharge: 2021-06-02 | Disposition: A | Discharge status: Home / Self Care | Payer: Managed Care, Other (non HMO) | Source: Ambulatory Visit | Attending: Cardiology | Admitting: Cardiology

## 2021-06-02 DIAGNOSIS — I214 Non-ST elevation (NSTEMI) myocardial infarction: Secondary | ICD-10-CM

## 2021-06-02 NOTE — Progress Notes (Signed)
Daily Session Note  Patient Details  Name: Zachary Weeks MRN: [id number] Date of Birth: Jul 12, 1981 Referring Provider:   Flowsheet Row CARDIAC REHAB PHASE II ORIENTATION from 05/30/2021 in [place]  Referring Provider Dr. Grandville Silos       Encounter Date: 06/02/2021  Check In:  Session Check In - 06/02/21 0815       Check-In   Supervising physician immediately available to respond to emergencies [facility] MD immediately available    Physician(s) Dr. Harl Bowie    Location [facility]    Staff Present Hoy Register, MS, ACSM-CEP, Exercise Physiologist;Debra Wynetta Emery, RN, BSN    Virtual Visit No    Medication changes reported     No    Fall or balance concerns reported    No    Tobacco Cessation No Change    Warm-up and Cool-down Performed as group-led instruction    Resistance Training Performed No    VAD Patient? No    PAD/SET Patient? No      Pain Assessment   Currently in Pain? No/denies    Multiple Pain Sites No             Capillary Blood Glucose: No results found for this or any previous visit (from the past 24 hour(s)).    Social History   Tobacco Use  Smoking Status Former   Packs/day: 1.00   Years: 3.00   Pack years: 3.00   Types: Cigarettes   Quit date: 1999   Years since quitting: 23.5  Smokeless Tobacco Current   Types: Chew, Snuff    Goals Met:  Independence with exercise equipment Exercise tolerated well No report of cardiac concerns or symptoms Strength training completed today  Goals Unmet:  Not Applicable  Comments: checkout time is 0915   Dr. Kathie Dike is Medical Director for [facility].

## 2021-06-04 ENCOUNTER — Encounter (HOSPITAL_COMMUNITY)
Admission: RE | Admit: 2021-06-04 | Discharge: 2021-06-04 | Disposition: A | Discharge status: Home / Self Care | Payer: Managed Care, Other (non HMO) | Source: Ambulatory Visit | Attending: Cardiology | Admitting: Cardiology

## 2021-06-04 ENCOUNTER — Other Ambulatory Visit: Payer: Self-pay

## 2021-06-04 DIAGNOSIS — I214 Non-ST elevation (NSTEMI) myocardial infarction: Secondary | ICD-10-CM | POA: Diagnosis not present

## 2021-06-04 NOTE — Progress Notes (Signed)
Daily Session Note  Patient Details  Name: Zachary Weeks MRN: [id number] Date of Birth: 03/06/1981 Referring Provider:   Flowsheet Row CARDIAC REHAB PHASE II ORIENTATION from 05/30/2021 in [place]  Referring Provider Dr. Grandville Silos       Encounter Date: 06/04/2021  Check In:  Session Check In - 06/04/21 0815       Check-In   Supervising physician immediately available to respond to emergencies [facility] MD immediately available    Physician(s) Dr. Harl Bowie    Location [facility]    Staff Present Hoy Register, MS, ACSM-CEP, Exercise Physiologist;Debra Wynetta Emery, RN, BSN    Virtual Visit No    Medication changes reported     No    Fall or balance concerns reported    No    Tobacco Cessation No Change    Warm-up and Cool-down Performed as group-led instruction    Resistance Training Performed Yes    VAD Patient? No    PAD/SET Patient? No      Pain Assessment   Currently in Pain? No/denies    Multiple Pain Sites No             Capillary Blood Glucose: No results found for this or any previous visit (from the past 24 hour(s)).    Social History   Tobacco Use  Smoking Status Former   Packs/day: 1.00   Years: 3.00   Pack years: 3.00   Types: Cigarettes   Quit date: 1999   Years since quitting: 23.5  Smokeless Tobacco Current   Types: Chew, Snuff    Goals Met:  Independence with exercise equipment Exercise tolerated well No report of cardiac concerns or symptoms Strength training completed today  Goals Unmet:  Not Applicable  Comments: checkout time is 0915   Dr. Kathie Dike is Medical Director for [facility] Pul[facility] Re[facility].

## 2021-06-06 ENCOUNTER — Telehealth: Payer: Self-pay

## 2021-06-06 ENCOUNTER — Other Ambulatory Visit: Payer: Self-pay

## 2021-06-06 ENCOUNTER — Encounter (HOSPITAL_COMMUNITY)
Admission: RE | Admit: 2021-06-06 | Discharge: 2021-06-06 | Disposition: A | Discharge status: Home / Self Care | Payer: Managed Care, Other (non HMO) | Source: Ambulatory Visit | Attending: Cardiology | Admitting: Cardiology

## 2021-06-06 DIAGNOSIS — I214 Non-ST elevation (NSTEMI) myocardial infarction: Secondary | ICD-10-CM

## 2021-06-06 NOTE — Telephone Encounter (Signed)
Please make sure the patient is compliant with metoprolol. If BP still remains elevated, would recommend adding amlodipine 5 mg daily.  Thanks MJP

## 2021-06-06 NOTE — Progress Notes (Signed)
Daily Session Note  Patient Details  Name: Zachary Weeks MRN: [id number] Date of Birth: 05/19/1981 Referring Provider:   Flowsheet Row CARDIAC REHAB PHASE II ORIENTATION from 05/30/2021 in [place]  Referring Provider Dr. Grandville Silos       Encounter Date: 06/06/2021  Check In:  Session Check In - 06/06/21 0815       Check-In   Supervising physician immediately available to respond to emergencies [facility] MD immediately available    Physician(s) Dr. Harl Bowie    Location [facility]    Staff Present Hoy Register, MS, ACSM-CEP, Exercise Physiologist;Debra Wynetta Emery, RN, BSN    Virtual Visit No    Medication changes reported     No    Fall or balance concerns reported    No    Tobacco Cessation No Change    Warm-up and Cool-down Performed as group-led instruction    Resistance Training Performed Yes    VAD Patient? No    PAD/SET Patient? No      Pain Assessment   Currently in Pain? No/denies    Multiple Pain Sites No             Capillary Blood Glucose: No results found for this or any previous visit (from the past 24 hour(s)).    Social History   Tobacco Use  Smoking Status Former   Packs/day: 1.00   Years: 3.00   Pack years: 3.00   Types: Cigarettes   Quit date: 1999   Years since quitting: 23.5  Smokeless Tobacco Current   Types: Chew, Snuff    Goals Met:  Independence with exercise equipment Exercise tolerated well No report of cardiac concerns or symptoms Strength training completed today  Goals Unmet:  Not Applicable  Comments: checkout time is 0915   Dr. Kathie Dike is Medical Director for [facility] Pul[facility] Re[facility].

## 2021-06-06 NOTE — Telephone Encounter (Signed)
Called pt to inform him about the message above. Pt understood and will call us sometime next week to give up his bp reading

## 2021-06-09 ENCOUNTER — Encounter (HOSPITAL_COMMUNITY)
Admission: RE | Admit: 2021-06-09 | Discharge: 2021-06-09 | Disposition: A | Discharge status: Home / Self Care | Payer: Managed Care, Other (non HMO) | Source: Ambulatory Visit | Attending: Cardiology | Admitting: Cardiology

## 2021-06-09 ENCOUNTER — Other Ambulatory Visit: Payer: Self-pay

## 2021-06-09 VITALS — Wt 323.4 lb

## 2021-06-09 DIAGNOSIS — I214 Non-ST elevation (NSTEMI) myocardial infarction: Secondary | ICD-10-CM

## 2021-06-09 NOTE — Progress Notes (Signed)
Daily Session Note  Patient Details  Name: Zachary Weeks MRN: [id number] Date of Birth: 10-18-1981 Referring Provider:   Flowsheet Row CARDIAC REHAB PHASE II ORIENTATION from 05/30/2021 in [place]  Referring Provider Dr. Grandville Silos       Encounter Date: 06/09/2021  Check In:  Session Check In - 06/09/21 0815       Check-In   Supervising physician immediately available to respond to emergencies [facility] MD immediately available    Physician(s) Dr. Harrington Challenger    Location [facility]    Staff Present Hoy Register, MS, ACSM-CEP, Exercise Physiologist;Debra Wynetta Emery, RN, BSN    Virtual Visit No    Medication changes reported     No    Fall or balance concerns reported    No    Tobacco Cessation No Change    Warm-up and Cool-down Performed as group-led instruction    Resistance Training Performed Yes    VAD Patient? No    PAD/SET Patient? No      Pain Assessment   Currently in Pain? No/denies    Multiple Pain Sites No             Capillary Blood Glucose: No results found for this or any previous visit (from the past 24 hour(s)).    Social History   Tobacco Use  Smoking Status Former   Packs/day: 1.00   Years: 3.00   Pack years: 3.00   Types: Cigarettes   Quit date: 1999   Years since quitting: 23.5  Smokeless Tobacco Current   Types: Chew, Snuff    Goals Met:  Independence with exercise equipment Exercise tolerated well No report of cardiac concerns or symptoms Strength training completed today  Goals Unmet:  Not Applicable  Comments: checkout time is 0915   Dr. Kathie Dike is Medical Director for [facility] Pul[facility] Re[facility].

## 2021-06-11 ENCOUNTER — Encounter (HOSPITAL_COMMUNITY)
Admission: RE | Admit: 2021-06-11 | Discharge: 2021-06-11 | Disposition: A | Discharge status: Home / Self Care | Payer: Managed Care, Other (non HMO) | Source: Ambulatory Visit | Attending: Cardiology | Admitting: Cardiology

## 2021-06-11 ENCOUNTER — Other Ambulatory Visit: Payer: Self-pay

## 2021-06-11 DIAGNOSIS — I214 Non-ST elevation (NSTEMI) myocardial infarction: Secondary | ICD-10-CM | POA: Diagnosis not present

## 2021-06-11 NOTE — Progress Notes (Signed)
Cardiac Individual Treatment Plan  Patient Details  Name: Zachary Weeks MRN: [id number] Date of Birth: 08-23-1981 Referring Provider:   Flowsheet Row CARDIAC REHAB PHASE II ORIENTATION from 05/30/2021 in [place]  Referring Provider Dr. Grandville Silos       Initial Encounter Date:  Flowsheet Row CARDIAC REHAB PHASE II ORIENTATION from 05/30/2021 in [place]  Date 05/30/21       Visit Diagnosis: NSTEMI (non-ST elevated myocardial infarction) [facility])  Patient's Home Medications on Admission:  Current Outpatient Medications:    albuterol (VENTOLIN HFA) 108 (90 Base) MCG/ACT inhaler, Inhale 1-2 puffs into the lungs every 6 (six) hours as needed for wheezing or shortness of breath., Disp: , Rfl:    aspirin 81 MG EC tablet, Take 1 tablet (81 mg total) by mouth daily. Swallow whole., Disp: 30 tablet, Rfl: 1   atorvastatin (LIPITOR) 40 MG tablet, Take 1 tablet (40 mg total) by mouth daily., Disp: 30 tablet, Rfl: 1   cholecalciferol (VITAMIN D) 25 MCG (1000 UNIT) tablet, Take 1,000 Units by mouth daily., Disp: , Rfl:    cyclobenzaprine (FLEXERIL) 10 MG tablet, Take by mouth., Disp: , Rfl:    metoprolol tartrate (LOPRESSOR) 25 MG tablet, Take 1 tablet (25 mg total) by mouth 2 (two) times daily., Disp: 60 tablet, Rfl: 1   nitroGLYCERIN (NITROSTAT) 0.4 MG SL tablet, Place 1 tablet (0.4 mg total) under the tongue every 5 (five) minutes as needed for chest pain., Disp: 30 tablet, Rfl: 1   pantoprazole (PROTONIX) 40 MG tablet, Take 40 mg by mouth daily., Disp: , Rfl:    ticagrelor (BRILINTA) 90 MG TABS tablet, Take 1 tablet (90 mg total) by mouth 2 (two) times daily., Disp: 60 tablet, Rfl: 1  Past Medical History: Past Medical History:  Diagnosis Date   Asthma    DVT (deep venous thrombosis) (HCC)    Dyspnea    at times - no known reason.    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   Pneumonia    89- 12 years of age    Tobacco Use: Social History   Tobacco Use  Smoking Status Former   Packs/day: 1.00   Years: 3.00   Pack years: 3.00   Types: Cigarettes   Quit date: 1999   Years since quitting: 23.5  Smokeless Tobacco Current   Types: Chew, Snuff    Labs: Recent Review Flowsheet Data     Labs for ITP Cardiac and Pulmonary Rehab Latest Ref Rng & Units 05/05/2021 05/06/2021   Cholestrol 0 - 200 mg/dL 158 -   LDLCALC 0 - 99 mg/dL 81 -   HDL >40 mg/dL 32(L) -   Trlycerides <150 mg/dL 227(H) -   Hemoglobin A1c 4.8 - 5.6 % 6.6(H) -   PHART 7.350 - 7.450 - 7.400   PCO2ART 32.0 - 48.0 mmHg - 42.1   HCO3 20.0 - 28.0 mmol/L - 25.5   O2SAT % - 94.6       Capillary Blood Glucose: Lab Results  Component Value Date   GLUCAP 122 (H) 05/07/2021   GLUCAP 132 (H) 05/07/2021   GLUCAP 144 (H) 05/06/2021   GLUCAP 116 (H) 05/06/2021     Exercise Target Goals: Exercise Program Goal: Individual exercise prescription set using results from initial 6 min walk test and THRR while considering  patient's activity barriers and safety.  Exercise Prescription Goal: Starting with aerobic activity 30 plus minutes a day, 3 days per week for initial exercise prescription. Provide home exercise prescription and guidelines that participant acknowledges understanding prior to discharge.  Activity Barriers & Risk Stratification:  Activity Barriers & Cardiac Risk Stratification - 05/30/21 1323       Activity Barriers & Cardiac Risk Stratification   Activity Barriers Shortness of Breath    Cardiac Risk Stratification High             6 Minute Walk:  6 Minute Walk     Row Name 05/30/21 1458         6 Minute Walk   Phase Initial     Distance 1100 feet     Walk Time 6 minutes     # of Rest Breaks 0     MPH 2.08     METS 3.17     RPE 9     VO2 Peak 11.1     Symptoms No     Resting HR 87 bpm     Resting BP 108/80     Resting Oxygen  Saturation  92 %     Exercise Oxygen Saturation  during 6 min walk 92 %     Max Ex. HR 100 bpm     Max Ex. BP 140/90     2 Minute Post BP 124/82              Oxygen Initial Assessment:   Oxygen Re-Evaluation:   Oxygen Discharge (Final Oxygen Re-Evaluation):   Initial Exercise Prescription:  Initial Exercise Prescription - 05/30/21 1400       Date of Initial Exercise RX and Referring Provider   Date 05/30/21    Referring Provider Dr. Grandville Silos    Expected Discharge Date 08/22/21      Treadmill   MPH 1    Grade 0    Minutes 17      NuStep   Level 1    SPM 80    Minutes 22      Prescription Details   Frequency (times per week) 3    Duration Progress to 30 minutes of continuous aerobic without signs/symptoms of physical distress      Intensity   THRR 40-80% of Max Heartrate 71-142    Ratings of Perceived Exertion 11-13    Perceived Dyspnea 0-4      Resistance Training   Training Prescription Yes    Weight 5 lbs    Reps 10-15             Perform Capillary Blood Glucose checks as needed.  Exercise Prescription Changes:   Exercise Prescription Changes     Row Name 06/09/21 1100             Response to Exercise   Blood Pressure (Admit) 132/92       Blood Pressure (Exercise) 138/84       Blood Pressure (Exit) 122/80       Heart Rate (Admit) 78 bpm       Heart Rate (Exercise) 104 bpm       Heart Rate (Exit) 87 bpm       Rating of Perceived Exertion (Exercise) 12       Duration Continue with 30 min of aerobic exercise without signs/symptoms of physical distress.       Intensity THRR unchanged               Progression     Progression  Continue to progress workloads to maintain intensity without signs/symptoms of physical distress.               Resistance Training     Training Prescription Yes       Weight 5 lbs       Reps 10-15       Time 10 Minutes               Treadmill     MPH 1.5       Grade 0       Minutes 17       METs 2.15                NuStep     Level 1       SPM 94       Minutes 22       METs 2.18               Exercise Comments:   Exercise Goals and Review:   Exercise Goals     Row Name 05/30/21 1500 06/09/21 1127           Exercise Goals   Increase Physical Activity Yes Yes      Intervention Provide advice, education, support and counseling about physical activity/exercise needs.;Develop an individualized exercise prescription for aerobic and resistive training based on initial evaluation findings, risk stratification, comorbidities and participant's personal goals. Provide advice, education, support and counseling about physical activity/exercise needs.;Develop an individualized exercise prescription for aerobic and resistive training based on initial evaluation findings, risk stratification, comorbidities and participant's personal goals.      Expected Outcomes Short Term: Attend rehab on a regular basis to increase amount of physical activity.;Long Term: Add in home exercise to make exercise part of routine and to increase amount of physical activity.;Long Term: Exercising regularly at least 3-5 days a week. Short Term: Attend rehab on a regular basis to increase amount of physical activity.;Long Term: Add in home exercise to make exercise part of routine and to increase amount of physical activity.;Long Term: Exercising regularly at least 3-5 days a week.      Increase Strength and Stamina Yes Yes      Intervention Provide advice, education, support and counseling about physical activity/exercise needs.;Develop an individualized exercise prescription for aerobic and resistive training based on initial evaluation findings, risk stratification, comorbidities and participant's personal goals. Provide advice, education, support and counseling about physical activity/exercise needs.;Develop an individualized exercise prescription for aerobic and resistive training based on initial evaluation  findings, risk stratification, comorbidities and participant's personal goals.      Expected Outcomes Short Term: Increase workloads from initial exercise prescription for resistance, speed, and METs.;Short Term: Perform resistance training exercises routinely during rehab and add in resistance training at home;Long Term: Improve cardiorespiratory fitness, muscular endurance and strength as measured by increased METs and functional capacity (6MWT) Short Term: Increase workloads from initial exercise prescription for resistance, speed, and METs.;Short Term: Perform resistance training exercises routinely during rehab and add in resistance training at home;Long Term: Improve cardiorespiratory fitness, muscular endurance and strength as measured by increased METs and functional capacity (6MWT)      Able to understand and use rate of perceived exertion (RPE) scale Yes Yes      Intervention Provide education and explanation on how to use RPE scale Provide education and explanation on how to use RPE scale      Expected Outcomes Short Term: Able to use RPE daily in  rehab to express subjective intensity level;Long Term:  Able to use RPE to guide intensity level when exercising independently Short Term: Able to use RPE daily in rehab to express subjective intensity level;Long Term:  Able to use RPE to guide intensity level when exercising independently      Knowledge and understanding of Target Heart Rate Range (THRR) Yes Yes      Intervention Provide education and explanation of THRR including how the numbers were predicted and where they are located for reference Provide education and explanation of THRR including how the numbers were predicted and where they are located for reference      Expected Outcomes Short Term: Able to state/look up THRR;Long Term: Able to use THRR to govern intensity when exercising independently;Short Term: Able to use daily as guideline for intensity in rehab Short Term: Able to  state/look up THRR;Long Term: Able to use THRR to govern intensity when exercising independently;Short Term: Able to use daily as guideline for intensity in rehab      Able to check pulse independently Yes Yes      Intervention Provide education and demonstration on how to check pulse in carotid and radial arteries.;Review the importance of being able to check your own pulse for safety during independent exercise Provide education and demonstration on how to check pulse in carotid and radial arteries.;Review the importance of being able to check your own pulse for safety during independent exercise      Expected Outcomes Short Term: Able to explain why pulse checking is important during independent exercise;Long Term: Able to check pulse independently and accurately Short Term: Able to explain why pulse checking is important during independent exercise;Long Term: Able to check pulse independently and accurately      Understanding of Exercise Prescription Yes Yes      Intervention Provide education, explanation, and written materials on patient's individual exercise prescription Provide education, explanation, and written materials on patient's individual exercise prescription      Expected Outcomes Short Term: Able to explain program exercise prescription;Long Term: Able to explain home exercise prescription to exercise independently Short Term: Able to explain program exercise prescription;Long Term: Able to explain home exercise prescription to exercise independently               Exercise Goals Re-Evaluation :  Exercise Goals Re-Evaluation     Row Name 06/09/21 1127             Exercise Goal Re-Evaluation   Exercise Goals Review Increase Physical Activity;Increase Strength and Stamina;Able to understand and use rate of perceived exertion (RPE) scale;Knowledge and understanding of Target Heart Rate Range (THRR);Able to check pulse independently;Understanding of Exercise Prescription        Comments Pt has attended 5 sessions of cardiac rehab. He is motivated to increase his workloads. His BPs have been elevated and his doctors have been notified. He has tolerated exercise well so far. He is currently exercising at 2.18 METs on the stepper. Will continue to monitor and progress as able.       Expected Outcomes Through exercise at rehab and at home, the patient will meet their stated goals.                 Discharge Exercise Prescription (Final Exercise Prescription Changes):  Exercise Prescription Changes - 06/09/21 1100       Response to Exercise   Blood Pressure (Admit) 132/92    Blood Pressure (Exercise) 138/84    Blood Pressure (Exit) 122/80  Heart Rate (Admit) 78 bpm    Heart Rate (Exercise) 104 bpm    Heart Rate (Exit) 87 bpm    Rating of Perceived Exertion (Exercise) 12    Duration Continue with 30 min of aerobic exercise without signs/symptoms of physical distress.    Intensity THRR unchanged      Progression   Progression Continue to progress workloads to maintain intensity without signs/symptoms of physical distress.      Resistance Training   Training Prescription Yes    Weight 5 lbs    Reps 10-15    Time 10 Minutes      Treadmill   MPH 1.5    Grade 0    Minutes 17    METs 2.15      NuStep   Level 1    SPM 94    Minutes 22    METs 2.18             Nutrition:  Target Goals: Understanding of nutrition guidelines, daily intake of sodium 1500mg , cholesterol 200mg , calories 30% from fat and 7% or less from saturated fats, daily to have 5 or more servings of fruits and vegetables.  Biometrics:  Pre Biometrics - 05/30/21 1501       Pre Biometrics   Height 6' (1.829 m)    Waist Circumference 57 inches    Hip Circumference 51 inches    Waist to Hip Ratio 1.12 %    Triceps Skinfold 27 mm    % Body Fat 42.4 %    Grip Strength 57.9 kg    Flexibility 0 in    Single Leg Stand 27.64 seconds              Nutrition Therapy Plan  and Nutrition Goals:  Nutrition Therapy & Goals - 06/02/21 1415       Personal Nutrition Goals   Comments Patient scored 50 on his diet assessment. We offer 2 educational sessions on heart healthy nutrition with handouts and offer assistance with RD referral if patient is interested.      Intervention Plan   Intervention Nutrition handout(s) given to patient.             Nutrition Assessments:  Nutrition Assessments - 05/30/21 1326       MEDFICTS Scores   Pre Score 50            MEDIFICTS Score Key: ?70 Need to make dietary changes  40-70 Heart Healthy Diet ? 40 Therapeutic Level Cholesterol Diet   Picture Your Plate Scores: <73 Unhealthy dietary pattern with much room for improvement. 41-50 Dietary pattern unlikely to meet recommendations for good health and room for improvement. 51-60 More healthful dietary pattern, with some room for improvement.  >60 Healthy dietary pattern, although there may be some specific behaviors that could be improved.    Nutrition Goals Re-Evaluation:   Nutrition Goals Discharge (Final Nutrition Goals Re-Evaluation):   Psychosocial: Target Goals: Acknowledge presence or absence of significant depression and/or stress, maximize coping skills, provide positive support system. Participant is able to verbalize types and ability to use techniques and skills needed for reducing stress and depression.  Initial Review & Psychosocial Screening:  Initial Psych Review & Screening - 05/30/21 1328       Initial Review   Current issues with None Identified      Family Dynamics   Good Support System? Yes    Comments His support system includes his family. He also has several friends who check in  on him regularly.      Barriers   Psychosocial barriers to participate in program There are no identifiable barriers or psychosocial needs.      Screening Interventions   Interventions Encouraged to exercise    Expected Outcomes Long Term Goal:  Stressors or current issues are controlled or eliminated.             Quality of Life Scores:  Quality of Life - 05/30/21 1454       Quality of Life   Select Quality of Life      Quality of Life Scores   Health/Function Pre 17.43 %    Socioeconomic Pre 19.56 %    Psych/Spiritual Pre 23.57 %    Family Pre 17.4 %    GLOBAL Pre 19.19 %            Scores of 19 and below usually indicate a poorer quality of life in these areas.  A difference of  2-3 points is a clinically meaningful difference.  A difference of 2-3 points in the total score of the Quality of Life Index has been associated with significant improvement in overall quality of life, self-image, physical symptoms, and general health in studies assessing change in quality of life.  PHQ-9: Recent Review Flowsheet Data     Depression screen [facility] 2/9 05/30/2021   Decreased Interest 0   Down, Depressed, Hopeless 0   PHQ - 2 Score 0   Altered sleeping 0   Tired, decreased energy 0   Change in appetite 0   Feeling bad or failure about yourself  0   Trouble concentrating 0   Moving slowly or fidgety/restless 0   Suicidal thoughts 0   PHQ-9 Score 0   Difficult doing work/chores Not difficult at all      Interpretation of Total Score  Total Score Depression Severity:  1-4 = Minimal depression, 5-9 = Mild depression, 10-14 = Moderate depression, 15-19 = Moderately severe depression, 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 Pt has no psychosocial issues. He scored a 0 on his PHQ-9. He does report that finances are a bit tight now, but he is not stressed about it. He has now been cleared by both cardiology and pulmonary to return to work, which he will on  Monday. He reports that he has a great support system with his family and his friends. Since his hospitial stay from his NSTEMI he has cleaned up his diet and has cut out friend foods and is limiting his consumption of sugary beverages. His goals while in the program are to continue to lose weight and decrease his SOB with exertion. He is eager to start the program and meet his goals.    Expected Outcomes The pt will continue to not have any identfiable psychosocial issues.    Continue Psychosocial Services  No Follow up required             Psychosocial Re-Evaluation:  Psychosocial Re-Evaluation     [place] Name 06/02/21 1414             Psychosocial Re-Evaluation   Current issues with None Identified       Comments Patient is new to the program completing 2 sessions. He continues  to have no psychosocial barriers identified. We will continue to monitor for progress.       Expected Outcomes Patient will continue to have no psychosocial issues identified.       Interventions Encouraged to attend Cardiac Rehabilitation for the exercise;Relaxation education;Stress management education       Continue Psychosocial Services  No Follow up required                Psychosocial Discharge (Final Psychosocial Re-Evaluation):  Psychosocial Re-Evaluation - 06/02/21 1414       Psychosocial Re-Evaluation   Current issues with None Identified    Comments Patient is new to the program completing 2 sessions. He continues to have no psychosocial barriers identified. We will continue to monitor for progress.    Expected Outcomes Patient will continue to have no psychosocial issues identified.    Interventions Encouraged to attend Cardiac Rehabilitation for the exercise;Relaxation education;Stress management education    Continue Psychosocial Services  No Follow up required             Vocational Rehabilitation: Provide vocational rehab assistance to qualifying candidates.   Vocational  Rehab Evaluation & Intervention:  Vocational Rehab - 05/30/21 1333       Initial Vocational Rehab Evaluation & Intervention   Assessment shows need for Vocational Rehabilitation No             Education: Education Goals: Education classes will be provided on a weekly basis, covering required topics. Participant will state understanding/return demonstration of topics presented.  Learning Barriers/Preferences:  Learning Barriers/Preferences - 05/30/21 1329       Learning Barriers/Preferences   Learning Barriers None    Learning Preferences Skilled Demonstration;Individual Instruction             Education Topics: Hypertension, Hypertension Reduction -Define heart disease and high blood pressure. Discus how high blood pressure affects the body and ways to reduce high blood pressure.   Exercise and Your Heart -Discuss why it is important to exercise, the FITT principles of exercise, normal and abnormal responses to exercise, and how to exercise safely. Flowsheet Row CARDIAC REHAB PHASE II EXERCISE from 06/11/2021 in [place]  Date 06/04/21  Educator DF  Instruction Review Code 2- Demonstrated Understanding       Angina -Discuss definition of angina, causes of angina, treatment of angina, and how to decrease risk of having angina. Flowsheet Row CARDIAC REHAB PHASE II EXERCISE from 06/11/2021 in [place]  Date 06/11/21  Educator DF  Instruction Review Code 2- Demonstrated Understanding       Cardiac Medications -Review what the following cardiac medications are used for, how they affect the body, and side effects that may occur when taking the medications.  Medications include Aspirin, Beta blockers, calcium channel blockers, ACE Inhibitors, angiotensin receptor blockers, diuretics, digoxin, and antihyperlipidemics.   Congestive Heart Failure -Discuss the definition of CHF, how to live with CHF, the signs and  symptoms of CHF, and how keep track of weight and sodium intake.   Heart Disease and Intimacy -Discus the effect sexual activity has on the heart, how changes occur during intimacy as we age, and safety during sexual activity.   Smoking Cessation / COPD -Discuss different methods to quit smoking, the health benefits of quitting smoking, and the definition of COPD.   Nutrition I: Fats -Discuss the types of cholesterol, what cholesterol does to the heart, and how cholesterol levels can be controlled.   Nutrition II: Labels -Discuss  the different components of food labels and how to read food label   Heart Parts/Heart Disease and PAD -Discuss the anatomy of the heart, the pathway of blood circulation through the heart, and these are affected by heart disease.   Stress I: Signs and Symptoms -Discuss the causes of stress, how stress may lead to anxiety and depression, and ways to limit stress.   Stress II: Relaxation -Discuss different types of relaxation techniques to limit stress.   Warning Signs of Stroke / TIA -Discuss definition of a stroke, what the signs and symptoms are of a stroke, and how to identify when someone is having stroke.   Knowledge Questionnaire Score:  Knowledge Questionnaire Score - 05/30/21 1329       Knowledge Questionnaire Score   Pre Score 20/24             Core Components/Risk Factors/Patient Goals at Admission:  Personal Goals and Risk Factors at Admission - 05/30/21 1333       Core Components/Risk Factors/Patient Goals on Admission    Weight Management Yes;Obesity;Weight Maintenance;Weight Loss    Intervention Weight Management: Develop a combined nutrition and exercise program designed to reach desired caloric intake, while maintaining appropriate intake of nutrient and fiber, sodium and fats, and appropriate energy expenditure required for the weight goal.;Weight Management: Provide education and appropriate resources to help  participant work on and attain dietary goals.;Weight Management/Obesity: Establish reasonable short term and long term weight goals.;Obesity: Provide education and appropriate resources to help participant work on and attain dietary goals.    Expected Outcomes Short Term: Continue to assess and modify interventions until short term weight is achieved;Long Term: Adherence to nutrition and physical activity/exercise program aimed toward attainment of established weight goal;Weight Maintenance: Understanding of the daily nutrition guidelines, which includes 25-35% calories from fat, 7% or less cal from saturated fats, less than 200mg  cholesterol, less than 1.5gm of sodium, & 5 or more servings of fruits and vegetables daily;Weight Loss: Understanding of general recommendations for a balanced deficit meal plan, which promotes 1-2 lb weight loss per week and includes a negative energy balance of [id number] kcal/d;Understanding recommendations for meals to include 15-35% energy as protein, 25-35% energy from fat, 35-60% energy from carbohydrates, less than 200mg  of dietary cholesterol, 20-35 gm of total fiber daily;Understanding of distribution of calorie intake throughout the day with the consumption of 4-5 meals/snacks    Improve shortness of breath with ADL's Yes    Intervention Provide education, individualized exercise plan and daily activity instruction to help decrease symptoms of SOB with activities of daily living.    Expected Outcomes Short Term: Improve cardiorespiratory fitness to achieve a reduction of symptoms when performing ADLs;Long Term: Be able to perform more ADLs without symptoms or delay the onset of symptoms    Heart Failure Yes    Intervention Provide a combined exercise and nutrition program that is supplemented with education, support and counseling about heart failure. Directed toward relieving symptoms such as shortness of breath, decreased exercise tolerance, and extremity edema.     Expected Outcomes Improve functional capacity of life;Short term: Attendance in program 2-3 days a week with increased exercise capacity. Reported lower sodium intake. Reported increased fruit and vegetable intake. Reports medication compliance.;Short term: Daily weights obtained and reported for increase. Utilizing diuretic protocols set by physician.;Long term: Adoption of self-care skills and reduction of barriers for early signs and symptoms recognition and intervention leading to self-care maintenance.    Hypertension Yes    Intervention Provide  education on lifestyle modifcations including regular physical activity/exercise, weight management, moderate sodium restriction and increased consumption of fresh fruit, vegetables, and low fat dairy, alcohol moderation, and smoking cessation.;Monitor prescription use compliance.    Expected Outcomes Short Term: Continued assessment and intervention until BP is < 140/65mm HG in hypertensive participants. < 130/15mm HG in hypertensive participants with diabetes, heart failure or chronic kidney disease.;Long Term: Maintenance of blood pressure at goal levels.    Lipids Yes    Intervention Provide education and support for participant on nutrition & aerobic/resistive exercise along with prescribed medications to achieve LDL 70mg , HDL >40mg .    Expected Outcomes Short Term: Participant states understanding of desired cholesterol values and is compliant with medications prescribed. Participant is following exercise prescription and nutrition guidelines.;Long Term: Cholesterol controlled with medications as prescribed, with individualized exercise RX and with personalized nutrition plan. Value goals: LDL < 70mg , HDL > 40 mg.             Core Components/Risk Factors/Patient Goals Review:   Goals and Risk Factor Review     Row Name 06/02/21 1416             Core Components/Risk Factors/Patient Goals Review   Personal Goals Review Other       Review  Paitent referred to CR with [facility]. He has multiple risk factors for CAD and is participating in the program for risk modification. He has completed 1 sessions. His personal goals for the program are to continue to lose weight and get stronger. We will continue to monitor his progress as he works toward meeting these personal goals.       Expected Outcomes Patient will complete the program meeting both personal and program goal.s                Core Components/Risk Factors/Patient Goals at Discharge (Final Review):   Goals and Risk Factor Review - 06/02/21 1416       Core Components/Risk Factors/Patient Goals Review   Personal Goals Review Other    Review Paitent referred to CR with [facility]STEMI. He has multiple risk factors for CAD and is participating in the program for risk modification. He has completed 1 sessions. His personal goals for the program are to continue to lose weight and get stronger. We will continue to monitor his progress as he works toward meeting these personal goals.    Expected Outcomes Patient will complete the program meeting both personal and program goal.s             ITP Comments:    Comments:

## 2021-06-11 NOTE — Progress Notes (Signed)
Cardiac Individual Treatment Plan  Patient Details  Name: Zachary Weeks MRN: [id number] Date of Birth: 1981-10-04 Referring Provider:   Flowsheet Row CARDIAC REHAB PHASE II ORIENTATION from 05/30/2021 in [place]  Referring Provider Dr. Grandville Silos       Initial Encounter Date:  Flowsheet Row CARDIAC REHAB PHASE II ORIENTATION from 05/30/2021 in [place]  Date 05/30/21       Visit Diagnosis: NSTEMI (non-ST elevated myocardial infarction) [facility])  Patient's Home Medications on Admission:  Current Outpatient Medications:    albuterol (VENTOLIN HFA) 108 (90 Base) MCG/ACT inhaler, Inhale 1-2 puffs into the lungs every 6 (six) hours as needed for wheezing or shortness of breath., Disp: , Rfl:    aspirin 81 MG EC tablet, Take 1 tablet (81 mg total) by mouth daily. Swallow whole., Disp: 30 tablet, Rfl: 1   atorvastatin (LIPITOR) 40 MG tablet, Take 1 tablet (40 mg total) by mouth daily., Disp: 30 tablet, Rfl: 1   cholecalciferol (VITAMIN D) 25 MCG (1000 UNIT) tablet, Take 1,000 Units by mouth daily., Disp: , Rfl:    cyclobenzaprine (FLEXERIL) 10 MG tablet, Take by mouth., Disp: , Rfl:    metoprolol tartrate (LOPRESSOR) 25 MG tablet, Take 1 tablet (25 mg total) by mouth 2 (two) times daily., Disp: 60 tablet, Rfl: 1   nitroGLYCERIN (NITROSTAT) 0.4 MG SL tablet, Place 1 tablet (0.4 mg total) under the tongue every 5 (five) minutes as needed for chest pain., Disp: 30 tablet, Rfl: 1   pantoprazole (PROTONIX) 40 MG tablet, Take 40 mg by mouth daily., Disp: , Rfl:    ticagrelor (BRILINTA) 90 MG TABS tablet, Take 1 tablet (90 mg total) by mouth 2 (two) times daily., Disp: 60 tablet, Rfl: 1  Past Medical History: Past Medical History:  Diagnosis Date   Asthma    DVT (deep venous thrombosis) (HCC)    Dyspnea    at times - no known reason.    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   Pneumonia    40- 23 years of age    Tobacco Use: Social History   Tobacco Use  Smoking Status Former   Packs/day: 1.00   Years: 3.00   Pack years: 3.00   Types: Cigarettes   Quit date: 1999   Years since quitting: 23.5  Smokeless Tobacco Current   Types: Chew, Snuff    Labs: Recent Review Flowsheet Data     Labs for ITP Cardiac and Pulmonary Rehab Latest Ref Rng & Units 05/05/2021 05/06/2021   Cholestrol 0 - 200 mg/dL 158 -   LDLCALC 0 - 99 mg/dL 81 -   HDL >40 mg/dL 32(L) -   Trlycerides <150 mg/dL 227(H) -   Hemoglobin A1c 4.8 - 5.6 % 6.6(H) -   PHART 7.350 - 7.450 - 7.400   PCO2ART 32.0 - 48.0 mmHg - 42.1   HCO3 20.0 - 28.0 mmol/L - 25.5   O2SAT % - 94.6       Capillary Blood Glucose: Lab Results  Component Value Date   GLUCAP 122 (H) 05/07/2021   GLUCAP 132 (H) 05/07/2021   GLUCAP 144 (H) 05/06/2021   GLUCAP 116 (H) 05/06/2021     Exercise Target Goals: Exercise Program Goal: Individual exercise prescription set using results from initial 6 min walk test and THRR while considering  patient's activity barriers and safety.  Exercise Prescription Goal: Starting with aerobic activity 30 plus minutes a day, 3 days per week for initial exercise prescription. Provide home exercise prescription and guidelines that participant acknowledges understanding prior to discharge.  Activity Barriers & Risk Stratification:  Activity Barriers & Cardiac Risk Stratification - 05/30/21 1323       Activity Barriers & Cardiac Risk Stratification   Activity Barriers Shortness of Breath    Cardiac Risk Stratification High             6 Minute Walk:  6 Minute Walk     Row Name 05/30/21 1458         6 Minute Walk   Phase Initial     Distance 1100 feet     Walk Time 6 minutes     # of Rest Breaks 0     MPH 2.08     METS 3.17     RPE 9     VO2 Peak 11.1     Symptoms No     Resting HR 87 bpm     Resting BP 108/80     Resting Oxygen  Saturation  92 %     Exercise Oxygen Saturation  during 6 min walk 92 %     Max Ex. HR 100 bpm     Max Ex. BP 140/90     2 Minute Post BP 124/82              Oxygen Initial Assessment:   Oxygen Re-Evaluation:   Oxygen Discharge (Final Oxygen Re-Evaluation):   Initial Exercise Prescription:  Initial Exercise Prescription - 05/30/21 1400       Date of Initial Exercise RX and Referring Provider   Date 05/30/21    Referring Provider Dr. Grandville Silos    Expected Discharge Date 08/22/21      Treadmill   MPH 1    Grade 0    Minutes 17      NuStep   Level 1    SPM 80    Minutes 22      Prescription Details   Frequency (times per week) 3    Duration Progress to 30 minutes of continuous aerobic without signs/symptoms of physical distress      Intensity   THRR 40-80% of Max Heartrate 71-142    Ratings of Perceived Exertion 11-13    Perceived Dyspnea 0-4      Resistance Training   Training Prescription Yes    Weight 5 lbs    Reps 10-15             Perform Capillary Blood Glucose checks as needed.  Exercise Prescription Changes:   Exercise Prescription Changes     Row Name 06/09/21 1100             Response to Exercise   Blood Pressure (Admit) 132/92       Blood Pressure (Exercise) 138/84       Blood Pressure (Exit) 122/80       Heart Rate (Admit) 78 bpm       Heart Rate (Exercise) 104 bpm       Heart Rate (Exit) 87 bpm       Rating of Perceived Exertion (Exercise) 12       Duration Continue with 30 min of aerobic exercise without signs/symptoms of physical distress.       Intensity THRR unchanged               Progression     Progression  Continue to progress workloads to maintain intensity without signs/symptoms of physical distress.               Resistance Training     Training Prescription Yes       Weight 5 lbs       Reps 10-15       Time 10 Minutes               Treadmill     MPH 1.5       Grade 0       Minutes 17       METs 2.15                NuStep     Level 1       SPM 94       Minutes 22       METs 2.18               Exercise Comments:   Exercise Goals and Review:   Exercise Goals     Row Name 05/30/21 1500 06/09/21 1127           Exercise Goals   Increase Physical Activity Yes Yes      Intervention Provide advice, education, support and counseling about physical activity/exercise needs.;Develop an individualized exercise prescription for aerobic and resistive training based on initial evaluation findings, risk stratification, comorbidities and participant's personal goals. Provide advice, education, support and counseling about physical activity/exercise needs.;Develop an individualized exercise prescription for aerobic and resistive training based on initial evaluation findings, risk stratification, comorbidities and participant's personal goals.      Expected Outcomes Short Term: Attend rehab on a regular basis to increase amount of physical activity.;Long Term: Add in home exercise to make exercise part of routine and to increase amount of physical activity.;Long Term: Exercising regularly at least 3-5 days a week. Short Term: Attend rehab on a regular basis to increase amount of physical activity.;Long Term: Add in home exercise to make exercise part of routine and to increase amount of physical activity.;Long Term: Exercising regularly at least 3-5 days a week.      Increase Strength and Stamina Yes Yes      Intervention Provide advice, education, support and counseling about physical activity/exercise needs.;Develop an individualized exercise prescription for aerobic and resistive training based on initial evaluation findings, risk stratification, comorbidities and participant's personal goals. Provide advice, education, support and counseling about physical activity/exercise needs.;Develop an individualized exercise prescription for aerobic and resistive training based on initial evaluation  findings, risk stratification, comorbidities and participant's personal goals.      Expected Outcomes Short Term: Increase workloads from initial exercise prescription for resistance, speed, and METs.;Short Term: Perform resistance training exercises routinely during rehab and add in resistance training at home;Long Term: Improve cardiorespiratory fitness, muscular endurance and strength as measured by increased METs and functional capacity (6MWT) Short Term: Increase workloads from initial exercise prescription for resistance, speed, and METs.;Short Term: Perform resistance training exercises routinely during rehab and add in resistance training at home;Long Term: Improve cardiorespiratory fitness, muscular endurance and strength as measured by increased METs and functional capacity (6MWT)      Able to understand and use rate of perceived exertion (RPE) scale Yes Yes      Intervention Provide education and explanation on how to use RPE scale Provide education and explanation on how to use RPE scale      Expected Outcomes Short Term: Able to use RPE daily in  rehab to express subjective intensity level;Long Term:  Able to use RPE to guide intensity level when exercising independently Short Term: Able to use RPE daily in rehab to express subjective intensity level;Long Term:  Able to use RPE to guide intensity level when exercising independently      Knowledge and understanding of Target Heart Rate Range (THRR) Yes Yes      Intervention Provide education and explanation of THRR including how the numbers were predicted and where they are located for reference Provide education and explanation of THRR including how the numbers were predicted and where they are located for reference      Expected Outcomes Short Term: Able to state/look up THRR;Long Term: Able to use THRR to govern intensity when exercising independently;Short Term: Able to use daily as guideline for intensity in rehab Short Term: Able to  state/look up THRR;Long Term: Able to use THRR to govern intensity when exercising independently;Short Term: Able to use daily as guideline for intensity in rehab      Able to check pulse independently Yes Yes      Intervention Provide education and demonstration on how to check pulse in carotid and radial arteries.;Review the importance of being able to check your own pulse for safety during independent exercise Provide education and demonstration on how to check pulse in carotid and radial arteries.;Review the importance of being able to check your own pulse for safety during independent exercise      Expected Outcomes Short Term: Able to explain why pulse checking is important during independent exercise;Long Term: Able to check pulse independently and accurately Short Term: Able to explain why pulse checking is important during independent exercise;Long Term: Able to check pulse independently and accurately      Understanding of Exercise Prescription Yes Yes      Intervention Provide education, explanation, and written materials on patient's individual exercise prescription Provide education, explanation, and written materials on patient's individual exercise prescription      Expected Outcomes Short Term: Able to explain program exercise prescription;Long Term: Able to explain home exercise prescription to exercise independently Short Term: Able to explain program exercise prescription;Long Term: Able to explain home exercise prescription to exercise independently               Exercise Goals Re-Evaluation :  Exercise Goals Re-Evaluation     Row Name 06/09/21 1127             Exercise Goal Re-Evaluation   Exercise Goals Review Increase Physical Activity;Increase Strength and Stamina;Able to understand and use rate of perceived exertion (RPE) scale;Knowledge and understanding of Target Heart Rate Range (THRR);Able to check pulse independently;Understanding of Exercise Prescription        Comments Pt has attended 5 sessions of cardiac rehab. He is motivated to increase his workloads. His BPs have been elevated and his doctors have been notified. He has tolerated exercise well so far. He is currently exercising at 2.18 METs on the stepper. Will continue to monitor and progress as able.       Expected Outcomes Through exercise at rehab and at home, the patient will meet their stated goals.                 Discharge Exercise Prescription (Final Exercise Prescription Changes):  Exercise Prescription Changes - 06/09/21 1100       Response to Exercise   Blood Pressure (Admit) 132/92    Blood Pressure (Exercise) 138/84    Blood Pressure (Exit) 122/80  Heart Rate (Admit) 78 bpm    Heart Rate (Exercise) 104 bpm    Heart Rate (Exit) 87 bpm    Rating of Perceived Exertion (Exercise) 12    Duration Continue with 30 min of aerobic exercise without signs/symptoms of physical distress.    Intensity THRR unchanged      Progression   Progression Continue to progress workloads to maintain intensity without signs/symptoms of physical distress.      Resistance Training   Training Prescription Yes    Weight 5 lbs    Reps 10-15    Time 10 Minutes      Treadmill   MPH 1.5    Grade 0    Minutes 17    METs 2.15      NuStep   Level 1    SPM 94    Minutes 22    METs 2.18             Nutrition:  Target Goals: Understanding of nutrition guidelines, daily intake of sodium 1500mg , cholesterol 200mg , calories 30% from fat and 7% or less from saturated fats, daily to have 5 or more servings of fruits and vegetables.  Biometrics:  Pre Biometrics - 05/30/21 1501       Pre Biometrics   Height 6' (1.829 m)    Waist Circumference 57 inches    Hip Circumference 51 inches    Waist to Hip Ratio 1.12 %    Triceps Skinfold 27 mm    % Body Fat 42.4 %    Grip Strength 57.9 kg    Flexibility 0 in    Single Leg Stand 27.64 seconds              Nutrition Therapy Plan  and Nutrition Goals:  Nutrition Therapy & Goals - 06/02/21 1415       Personal Nutrition Goals   Comments Patient scored 50 on his diet assessment. We offer 2 educational sessions on heart healthy nutrition with handouts and offer assistance with RD referral if patient is interested.      Intervention Plan   Intervention Nutrition handout(s) given to patient.             Nutrition Assessments:  Nutrition Assessments - 05/30/21 1326       MEDFICTS Scores   Pre Score 50            MEDIFICTS Score Key: ?70 Need to make dietary changes  40-70 Heart Healthy Diet ? 40 Therapeutic Level Cholesterol Diet   Picture Your Plate Scores: <17 Unhealthy dietary pattern with much room for improvement. 41-50 Dietary pattern unlikely to meet recommendations for good health and room for improvement. 51-60 More healthful dietary pattern, with some room for improvement.  >60 Healthy dietary pattern, although there may be some specific behaviors that could be improved.    Nutrition Goals Re-Evaluation:   Nutrition Goals Discharge (Final Nutrition Goals Re-Evaluation):   Psychosocial: Target Goals: Acknowledge presence or absence of significant depression and/or stress, maximize coping skills, provide positive support system. Participant is able to verbalize types and ability to use techniques and skills needed for reducing stress and depression.  Initial Review & Psychosocial Screening:  Initial Psych Review & Screening - 05/30/21 1328       Initial Review   Current issues with None Identified      Family Dynamics   Good Support System? Yes    Comments His support system includes his family. He also has several friends who check in  on him regularly.      Barriers   Psychosocial barriers to participate in program There are no identifiable barriers or psychosocial needs.      Screening Interventions   Interventions Encouraged to exercise    Expected Outcomes Long Term Goal:  Stressors or current issues are controlled or eliminated.             Quality of Life Scores:  Quality of Life - 05/30/21 1454       Quality of Life   Select Quality of Life      Quality of Life Scores   Health/Function Pre 17.43 %    Socioeconomic Pre 19.56 %    Psych/Spiritual Pre 23.57 %    Family Pre 17.4 %    GLOBAL Pre 19.19 %            Scores of 19 and below usually indicate a poorer quality of life in these areas.  A difference of  2-3 points is a clinically meaningful difference.  A difference of 2-3 points in the total score of the Quality of Life Index has been associated with significant improvement in overall quality of life, self-image, physical symptoms, and general health in studies assessing change in quality of life.  PHQ-9: Recent Review Flowsheet Data     Depression screen [facility] 2/9 05/30/2021   Decreased Interest 0   Down, Depressed, Hopeless 0   PHQ - 2 Score 0   Altered sleeping 0   Tired, decreased energy 0   Change in appetite 0   Feeling bad or failure about yourself  0   Trouble concentrating 0   Moving slowly or fidgety/restless 0   Suicidal thoughts 0   PHQ-9 Score 0   Difficult doing work/chores Not difficult at all      Interpretation of Total Score  Total Score Depression Severity:  1-4 = Minimal depression, 5-9 = Mild depression, 10-14 = Moderate depression, 15-19 = Moderately severe depression, 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 Pt has no psychosocial issues. He scored a 0 on his PHQ-9. He does report that finances are a bit tight now, but he is not stressed about it. He has now been cleared by both cardiology and pulmonary to return to work, which he will on  Monday. He reports that he has a great support system with his family and his friends. Since his hospitial stay from his NSTEMI he has cleaned up his diet and has cut out friend foods and is limiting his consumption of sugary beverages. His goals while in the program are to continue to lose weight and decrease his SOB with exertion. He is eager to start the program and meet his goals.    Expected Outcomes The pt will continue to not have any identfiable psychosocial issues.    Continue Psychosocial Services  No Follow up required             Psychosocial Re-Evaluation:  Psychosocial Re-Evaluation     [place] Name 06/02/21 1414             Psychosocial Re-Evaluation   Current issues with None Identified       Comments Patient is new to the program completing 2 sessions. He continues  to have no psychosocial barriers identified. We will continue to monitor for progress.       Expected Outcomes Patient will continue to have no psychosocial issues identified.       Interventions Encouraged to attend Cardiac Rehabilitation for the exercise;Relaxation education;Stress management education       Continue Psychosocial Services  No Follow up required                Psychosocial Discharge (Final Psychosocial Re-Evaluation):  Psychosocial Re-Evaluation - 06/02/21 1414       Psychosocial Re-Evaluation   Current issues with None Identified    Comments Patient is new to the program completing 2 sessions. He continues to have no psychosocial barriers identified. We will continue to monitor for progress.    Expected Outcomes Patient will continue to have no psychosocial issues identified.    Interventions Encouraged to attend Cardiac Rehabilitation for the exercise;Relaxation education;Stress management education    Continue Psychosocial Services  No Follow up required             Vocational Rehabilitation: Provide vocational rehab assistance to qualifying candidates.   Vocational  Rehab Evaluation & Intervention:  Vocational Rehab - 05/30/21 1333       Initial Vocational Rehab Evaluation & Intervention   Assessment shows need for Vocational Rehabilitation No             Education: Education Goals: Education classes will be provided on a weekly basis, covering required topics. Participant will state understanding/return demonstration of topics presented.  Learning Barriers/Preferences:  Learning Barriers/Preferences - 05/30/21 1329       Learning Barriers/Preferences   Learning Barriers None    Learning Preferences Skilled Demonstration;Individual Instruction             Education Topics: Hypertension, Hypertension Reduction -Define heart disease and high blood pressure. Discus how high blood pressure affects the body and ways to reduce high blood pressure.   Exercise and Your Heart -Discuss why it is important to exercise, the FITT principles of exercise, normal and abnormal responses to exercise, and how to exercise safely. Flowsheet Row CARDIAC REHAB PHASE II EXERCISE from 06/04/2021 in [place]  Date 06/04/21  Educator DF  Instruction Review Code 2- Demonstrated Understanding       Angina -Discuss definition of angina, causes of angina, treatment of angina, and how to decrease risk of having angina.   Cardiac Medications -Review what the following cardiac medications are used for, how they affect the body, and side effects that may occur when taking the medications.  Medications include Aspirin, Beta blockers, calcium channel blockers, ACE Inhibitors, angiotensin receptor blockers, diuretics, digoxin, and antihyperlipidemics.   Congestive Heart Failure -Discuss the definition of CHF, how to live with CHF, the signs and symptoms of CHF, and how keep track of weight and sodium intake.   Heart Disease and Intimacy -Discus the effect sexual activity has on the heart, how changes occur during intimacy as we age,  and safety during sexual activity.   Smoking Cessation / COPD -Discuss different methods to quit smoking, the health benefits of quitting smoking, and the definition of COPD.   Nutrition I: Fats -Discuss the types of cholesterol, what cholesterol does to the heart, and how cholesterol levels can be controlled.   Nutrition II: Labels -Discuss the different components of food labels and how to read food label   Heart Parts/Heart Disease and PAD -Discuss the anatomy of the heart, the pathway of blood circulation through  the heart, and these are affected by heart disease.   Stress I: Signs and Symptoms -Discuss the causes of stress, how stress may lead to anxiety and depression, and ways to limit stress.   Stress II: Relaxation -Discuss different types of relaxation techniques to limit stress.   Warning Signs of Stroke / TIA -Discuss definition of a stroke, what the signs and symptoms are of a stroke, and how to identify when someone is having stroke.   Knowledge Questionnaire Score:  Knowledge Questionnaire Score - 05/30/21 1329       Knowledge Questionnaire Score   Pre Score 20/24             Core Components/Risk Factors/Patient Goals at Admission:  Personal Goals and Risk Factors at Admission - 05/30/21 1333       Core Components/Risk Factors/Patient Goals on Admission    Weight Management Yes;Obesity;Weight Maintenance;Weight Loss    Intervention Weight Management: Develop a combined nutrition and exercise program designed to reach desired caloric intake, while maintaining appropriate intake of nutrient and fiber, sodium and fats, and appropriate energy expenditure required for the weight goal.;Weight Management: Provide education and appropriate resources to help participant work on and attain dietary goals.;Weight Management/Obesity: Establish reasonable short term and long term weight goals.;Obesity: Provide education and appropriate resources to help participant  work on and attain dietary goals.    Expected Outcomes Short Term: Continue to assess and modify interventions until short term weight is achieved;Long Term: Adherence to nutrition and physical activity/exercise program aimed toward attainment of established weight goal;Weight Maintenance: Understanding of the daily nutrition guidelines, which includes 25-35% calories from fat, 7% or less cal from saturated fats, less than 200mg  cholesterol, less than 1.5gm of sodium, & 5 or more servings of fruits and vegetables daily;Weight Loss: Understanding of general recommendations for a balanced deficit meal plan, which promotes 1-2 lb weight loss per week and includes a negative energy balance of [phone] kcal/d;Understanding recommendations for meals to include 15-35% energy as protein, 25-35% energy from fat, 35-60% energy from carbohydrates, less than 200mg  of dietary cholesterol, 20-35 gm of total fiber daily;Understanding of distribution of calorie intake throughout the day with the consumption of 4-5 meals/snacks    Improve shortness of breath with ADL's Yes    Intervention Provide education, individualized exercise plan and daily activity instruction to help decrease symptoms of SOB with activities of daily living.    Expected Outcomes Short Term: Improve cardiorespiratory fitness to achieve a reduction of symptoms when performing ADLs;Long Term: Be able to perform more ADLs without symptoms or delay the onset of symptoms    Heart Failure Yes    Intervention Provide a combined exercise and nutrition program that is supplemented with education, support and counseling about heart failure. Directed toward relieving symptoms such as shortness of breath, decreased exercise tolerance, and extremity edema.    Expected Outcomes Improve functional capacity of life;Short term: Attendance in program 2-3 days a week with increased exercise capacity. Reported lower sodium intake. Reported increased fruit and vegetable  intake. Reports medication compliance.;Short term: Daily weights obtained and reported for increase. Utilizing diuretic protocols set by physician.;Long term: Adoption of self-care skills and reduction of barriers for early signs and symptoms recognition and intervention leading to self-care maintenance.    Hypertension Yes    Intervention Provide education on lifestyle modifcations including regular physical activity/exercise, weight management, moderate sodium restriction and increased consumption of fresh fruit, vegetables, and low fat dairy, alcohol moderation, and smoking cessation.;Monitor prescription use  compliance.    Expected Outcomes Short Term: Continued assessment and intervention until BP is < 140/86mm HG in hypertensive participants. < 130/99mm HG in hypertensive participants with diabetes, heart failure or chronic kidney disease.;Long Term: Maintenance of blood pressure at goal levels.    Lipids Yes    Intervention Provide education and support for participant on nutrition & aerobic/resistive exercise along with prescribed medications to achieve LDL 70mg , HDL >40mg .    Expected Outcomes Short Term: Participant states understanding of desired cholesterol values and is compliant with medications prescribed. Participant is following exercise prescription and nutrition guidelines.;Long Term: Cholesterol controlled with medications as prescribed, with individualized exercise RX and with personalized nutrition plan. Value goals: LDL < 70mg , HDL > 40 mg.             Core Components/Risk Factors/Patient Goals Review:   Goals and Risk Factor Review     Row Name 06/02/21 1416             Core Components/Risk Factors/Patient Goals Review   Personal Goals Review Other       Review Paitent referred to CR with [facility]. He has multiple risk factors for CAD and is participating in the program for risk modification. He has completed 1 sessions. His personal goals for the program are to  continue to lose weight and get stronger. We will continue to monitor his progress as he works toward meeting these personal goals.       Expected Outcomes Patient will complete the program meeting both personal and program goal.s                Core Components/Risk Factors/Patient Goals at Discharge (Final Review):   Goals and Risk Factor Review - 06/02/21 1416       Core Components/Risk Factors/Patient Goals Review   Personal Goals Review Other    Review Paitent referred to CR with [facility]. He has multiple risk factors for CAD and is participating in the program for risk modification. He has completed 1 sessions. His personal goals for the program are to continue to lose weight and get stronger. We will continue to monitor his progress as he works toward meeting these personal goals.    Expected Outcomes Patient will complete the program meeting both personal and program goal.s             ITP Comments: ITP REVIEW Pt is making expected progress toward Cardiac Rehab goals after completing 6 sessions. Recommend continued exercise, life style modification, education, and increased stamina and strength.    Comments:

## 2021-06-11 NOTE — Progress Notes (Signed)
Cardiac Individual Treatment Plan  Patient Details  Name: Zachary Weeks MRN: [id number] Date of Birth: [birth year] Referring Provider:   Flowsheet Row CARDIAC REHAB PHASE II ORIENTATION from 05/30/2021 in [place]  Referring Provider Dr. Grandville Silos       Initial Encounter Date:  Flowsheet Row CARDIAC REHAB PHASE II ORIENTATION from 05/30/2021 in [place]  Date 05/30/21       Visit Diagnosis: NSTEMI (non-ST elevated myocardial infarction) [facility])  Patient's Home Medications on Admission:  Current Outpatient Medications:    albuterol (VENTOLIN HFA) 108 (90 Base) MCG/ACT inhaler, Inhale 1-2 puffs into the lungs every 6 (six) hours as needed for wheezing or shortness of breath., Disp: , Rfl:    aspirin 81 MG EC tablet, Take 1 tablet (81 mg total) by mouth daily. Swallow whole., Disp: 30 tablet, Rfl: 1   atorvastatin (LIPITOR) 40 MG tablet, Take 1 tablet (40 mg total) by mouth daily., Disp: 30 tablet, Rfl: 1   cholecalciferol (VITAMIN D) 25 MCG (1000 UNIT) tablet, Take 1,000 Units by mouth daily., Disp: , Rfl:    cyclobenzaprine (FLEXERIL) 10 MG tablet, Take by mouth., Disp: , Rfl:    metoprolol tartrate (LOPRESSOR) 25 MG tablet, Take 1 tablet (25 mg total) by mouth 2 (two) times daily., Disp: 60 tablet, Rfl: 1   nitroGLYCERIN (NITROSTAT) 0.4 MG SL tablet, Place 1 tablet (0.4 mg total) under the tongue every 5 (five) minutes as needed for chest pain., Disp: 30 tablet, Rfl: 1   pantoprazole (PROTONIX) 40 MG tablet, Take 40 mg by mouth daily., Disp: , Rfl:    ticagrelor (BRILINTA) 90 MG TABS tablet, Take 1 tablet (90 mg total) by mouth 2 (two) times daily., Disp: 60 tablet, Rfl: 1  Past Medical History: Past Medical History:  Diagnosis Date   Asthma    DVT (deep venous thrombosis) (HCC)    Dyspnea    at times - no known reason.    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   Pneumonia    48- 34 years of age    Tobacco Use: Social History   Tobacco Use  Smoking Status Former   Packs/day: 1.00   Years: 3.00   Pack years: 3.00   Types: Cigarettes   Quit date: 1999   Years since quitting: 23.5  Smokeless Tobacco Current   Types: Chew, Snuff    Labs: Recent Review Flowsheet Data     Labs for ITP Cardiac and Pulmonary Rehab Latest Ref Rng & Units 05/05/2021 05/06/2021   Cholestrol 0 - 200 mg/dL 158 -   LDLCALC 0 - 99 mg/dL 81 -   HDL >40 mg/dL 32(L) -   Trlycerides <150 mg/dL 227(H) -   Hemoglobin A1c 4.8 - 5.6 % 6.6(H) -   PHART 7.350 - 7.450 - 7.400   PCO2ART 32.0 - 48.0 mmHg - 42.1   HCO3 20.0 - 28.0 mmol/L - 25.5   O2SAT % - 94.6       Capillary Blood Glucose: Lab Results  Component Value Date   GLUCAP 122 (H) 05/07/2021   GLUCAP 132 (H) 05/07/2021   GLUCAP 144 (H) 05/06/2021   GLUCAP 116 (H) 05/06/2021     Exercise Target Goals: Exercise Program Goal: Individual exercise prescription set using results from initial 6 min walk test and THRR while considering  patient's activity barriers and safety.  Exercise Prescription Goal: Starting with aerobic activity 30 plus minutes a day, 3 days per week for initial exercise prescription. Provide home exercise prescription and guidelines that participant acknowledges understanding prior to discharge.  Activity Barriers & Risk Stratification:  Activity Barriers & Cardiac Risk Stratification - 05/30/21 1323       Activity Barriers & Cardiac Risk Stratification   Activity Barriers Shortness of Breath    Cardiac Risk Stratification High             6 Minute Walk:  6 Minute Walk     Row Name 05/30/21 1458         6 Minute Walk   Phase Initial     Distance 1100 feet     Walk Time 6 minutes     # of Rest Breaks 0     MPH 2.08     METS 3.17     RPE 9     VO2 Peak 11.1     Symptoms No     Resting HR 87 bpm     Resting BP 108/80     Resting Oxygen  Saturation  92 %     Exercise Oxygen Saturation  during 6 min walk 92 %     Max Ex. HR 100 bpm     Max Ex. BP 140/90     2 Minute Post BP 124/82              Oxygen Initial Assessment:   Oxygen Re-Evaluation:   Oxygen Discharge (Final Oxygen Re-Evaluation):   Initial Exercise Prescription:  Initial Exercise Prescription - 05/30/21 1400       Date of Initial Exercise RX and Referring Provider   Date 05/30/21    Referring Provider Dr. Grandville Silos    Expected Discharge Date 08/22/21      Treadmill   MPH 1    Grade 0    Minutes 17      NuStep   Level 1    SPM 80    Minutes 22      Prescription Details   Frequency (times per week) 3    Duration Progress to 30 minutes of continuous aerobic without signs/symptoms of physical distress      Intensity   THRR 40-80% of Max Heartrate 71-142    Ratings of Perceived Exertion 11-13    Perceived Dyspnea 0-4      Resistance Training   Training Prescription Yes    Weight 5 lbs    Reps 10-15             Perform Capillary Blood Glucose checks as needed.  Exercise Prescription Changes:   Exercise Prescription Changes     Row Name 06/09/21 1100             Response to Exercise   Blood Pressure (Admit) 132/92       Blood Pressure (Exercise) 138/84       Blood Pressure (Exit) 122/80       Heart Rate (Admit) 78 bpm       Heart Rate (Exercise) 104 bpm       Heart Rate (Exit) 87 bpm       Rating of Perceived Exertion (Exercise) 12       Duration Continue with 30 min of aerobic exercise without signs/symptoms of physical distress.       Intensity THRR unchanged               Progression     Progression  Continue to progress workloads to maintain intensity without signs/symptoms of physical distress.               Resistance Training     Training Prescription Yes       Weight 5 lbs       Reps 10-15       Time 10 Minutes               Treadmill     MPH 1.5       Grade 0       Minutes 17       METs 2.15                NuStep     Level 1       SPM 94       Minutes 22       METs 2.18               Exercise Comments:   Exercise Goals and Review:   Exercise Goals     Row Name 05/30/21 1500 06/09/21 1127           Exercise Goals   Increase Physical Activity Yes Yes      Intervention Provide advice, education, support and counseling about physical activity/exercise needs.;Develop an individualized exercise prescription for aerobic and resistive training based on initial evaluation findings, risk stratification, comorbidities and participant's personal goals. Provide advice, education, support and counseling about physical activity/exercise needs.;Develop an individualized exercise prescription for aerobic and resistive training based on initial evaluation findings, risk stratification, comorbidities and participant's personal goals.      Expected Outcomes Short Term: Attend rehab on a regular basis to increase amount of physical activity.;Long Term: Add in home exercise to make exercise part of routine and to increase amount of physical activity.;Long Term: Exercising regularly at least 3-5 days a week. Short Term: Attend rehab on a regular basis to increase amount of physical activity.;Long Term: Add in home exercise to make exercise part of routine and to increase amount of physical activity.;Long Term: Exercising regularly at least 3-5 days a week.      Increase Strength and Stamina Yes Yes      Intervention Provide advice, education, support and counseling about physical activity/exercise needs.;Develop an individualized exercise prescription for aerobic and resistive training based on initial evaluation findings, risk stratification, comorbidities and participant's personal goals. Provide advice, education, support and counseling about physical activity/exercise needs.;Develop an individualized exercise prescription for aerobic and resistive training based on initial evaluation  findings, risk stratification, comorbidities and participant's personal goals.      Expected Outcomes Short Term: Increase workloads from initial exercise prescription for resistance, speed, and METs.;Short Term: Perform resistance training exercises routinely during rehab and add in resistance training at home;Long Term: Improve cardiorespiratory fitness, muscular endurance and strength as measured by increased METs and functional capacity (6MWT) Short Term: Increase workloads from initial exercise prescription for resistance, speed, and METs.;Short Term: Perform resistance training exercises routinely during rehab and add in resistance training at home;Long Term: Improve cardiorespiratory fitness, muscular endurance and strength as measured by increased METs and functional capacity (6MWT)      Able to understand and use rate of perceived exertion (RPE) scale Yes Yes      Intervention Provide education and explanation on how to use RPE scale Provide education and explanation on how to use RPE scale      Expected Outcomes Short Term: Able to use RPE daily in  rehab to express subjective intensity level;Long Term:  Able to use RPE to guide intensity level when exercising independently Short Term: Able to use RPE daily in rehab to express subjective intensity level;Long Term:  Able to use RPE to guide intensity level when exercising independently      Knowledge and understanding of Target Heart Rate Range (THRR) Yes Yes      Intervention Provide education and explanation of THRR including how the numbers were predicted and where they are located for reference Provide education and explanation of THRR including how the numbers were predicted and where they are located for reference      Expected Outcomes Short Term: Able to state/look up THRR;Long Term: Able to use THRR to govern intensity when exercising independently;Short Term: Able to use daily as guideline for intensity in rehab Short Term: Able to  state/look up THRR;Long Term: Able to use THRR to govern intensity when exercising independently;Short Term: Able to use daily as guideline for intensity in rehab      Able to check pulse independently Yes Yes      Intervention Provide education and demonstration on how to check pulse in carotid and radial arteries.;Review the importance of being able to check your own pulse for safety during independent exercise Provide education and demonstration on how to check pulse in carotid and radial arteries.;Review the importance of being able to check your own pulse for safety during independent exercise      Expected Outcomes Short Term: Able to explain why pulse checking is important during independent exercise;Long Term: Able to check pulse independently and accurately Short Term: Able to explain why pulse checking is important during independent exercise;Long Term: Able to check pulse independently and accurately      Understanding of Exercise Prescription Yes Yes      Intervention Provide education, explanation, and written materials on patient's individual exercise prescription Provide education, explanation, and written materials on patient's individual exercise prescription      Expected Outcomes Short Term: Able to explain program exercise prescription;Long Term: Able to explain home exercise prescription to exercise independently Short Term: Able to explain program exercise prescription;Long Term: Able to explain home exercise prescription to exercise independently               Exercise Goals Re-Evaluation :  Exercise Goals Re-Evaluation     Row Name 06/09/21 1127             Exercise Goal Re-Evaluation   Exercise Goals Review Increase Physical Activity;Increase Strength and Stamina;Able to understand and use rate of perceived exertion (RPE) scale;Knowledge and understanding of Target Heart Rate Range (THRR);Able to check pulse independently;Understanding of Exercise Prescription        Comments Pt has attended 5 sessions of cardiac rehab. He is motivated to increase his workloads. His BPs have been elevated and his doctors have been notified. He has tolerated exercise well so far. He is currently exercising at 2.18 METs on the stepper. Will continue to monitor and progress as able.       Expected Outcomes Through exercise at rehab and at home, the patient will meet their stated goals.                 Discharge Exercise Prescription (Final Exercise Prescription Changes):  Exercise Prescription Changes - 06/09/21 1100       Response to Exercise   Blood Pressure (Admit) 132/92    Blood Pressure (Exercise) 138/84    Blood Pressure (Exit) 122/80  Heart Rate (Admit) 78 bpm    Heart Rate (Exercise) 104 bpm    Heart Rate (Exit) 87 bpm    Rating of Perceived Exertion (Exercise) 12    Duration Continue with 30 min of aerobic exercise without signs/symptoms of physical distress.    Intensity THRR unchanged      Progression   Progression Continue to progress workloads to maintain intensity without signs/symptoms of physical distress.      Resistance Training   Training Prescription Yes    Weight 5 lbs    Reps 10-15    Time 10 Minutes      Treadmill   MPH 1.5    Grade 0    Minutes 17    METs 2.15      NuStep   Level 1    SPM 94    Minutes 22    METs 2.18             Nutrition:  Target Goals: Understanding of nutrition guidelines, daily intake of sodium 1500mg , cholesterol 200mg , calories 30% from fat and 7% or less from saturated fats, daily to have 5 or more servings of fruits and vegetables.  Biometrics:  Pre Biometrics - 05/30/21 1501       Pre Biometrics   Height 6' (1.829 m)    Waist Circumference 57 inches    Hip Circumference 51 inches    Waist to Hip Ratio 1.12 %    Triceps Skinfold 27 mm    % Body Fat 42.4 %    Grip Strength 57.9 kg    Flexibility 0 in    Single Leg Stand 27.64 seconds              Nutrition Therapy Plan  and Nutrition Goals:  Nutrition Therapy & Goals - 06/02/21 1415       Personal Nutrition Goals   Comments Patient scored 50 on his diet assessment. We offer 2 educational sessions on heart healthy nutrition with handouts and offer assistance with RD referral if patient is interested.      Intervention Plan   Intervention Nutrition handout(s) given to patient.             Nutrition Assessments:  Nutrition Assessments - 05/30/21 1326       MEDFICTS Scores   Pre Score 50            MEDIFICTS Score Key: ?70 Need to make dietary changes  40-70 Heart Healthy Diet ? 40 Therapeutic Level Cholesterol Diet   Picture Your Plate Scores: <00 Unhealthy dietary pattern with much room for improvement. 41-50 Dietary pattern unlikely to meet recommendations for good health and room for improvement. 51-60 More healthful dietary pattern, with some room for improvement.  >60 Healthy dietary pattern, although there may be some specific behaviors that could be improved.    Nutrition Goals Re-Evaluation:   Nutrition Goals Discharge (Final Nutrition Goals Re-Evaluation):   Psychosocial: Target Goals: Acknowledge presence or absence of significant depression and/or stress, maximize coping skills, provide positive support system. Participant is able to verbalize types and ability to use techniques and skills needed for reducing stress and depression.  Initial Review & Psychosocial Screening:  Initial Psych Review & Screening - 05/30/21 1328       Initial Review   Current issues with None Identified      Family Dynamics   Good Support System? Yes    Comments His support system includes his family. He also has several friends who check in  on him regularly.      Barriers   Psychosocial barriers to participate in program There are no identifiable barriers or psychosocial needs.      Screening Interventions   Interventions Encouraged to exercise    Expected Outcomes Long Term Goal:  Stressors or current issues are controlled or eliminated.             Quality of Life Scores:  Quality of Life - 05/30/21 1454       Quality of Life   Select Quality of Life      Quality of Life Scores   Health/Function Pre 17.43 %    Socioeconomic Pre 19.56 %    Psych/Spiritual Pre 23.57 %    Family Pre 17.4 %    GLOBAL Pre 19.19 %            Scores of 19 and below usually indicate a poorer quality of life in these areas.  A difference of  2-3 points is a clinically meaningful difference.  A difference of 2-3 points in the total score of the Quality of Life Index has been associated with significant improvement in overall quality of life, self-image, physical symptoms, and general health in studies assessing change in quality of life.  PHQ-9: Recent Review Flowsheet Data     Depression screen [facility] 2/9 05/30/2021   Decreased Interest 0   Down, Depressed, Hopeless 0   PHQ - 2 Score 0   Altered sleeping 0   Tired, decreased energy 0   Change in appetite 0   Feeling bad or failure about yourself  0   Trouble concentrating 0   Moving slowly or fidgety/restless 0   Suicidal thoughts 0   PHQ-9 Score 0   Difficult doing work/chores Not difficult at all      Interpretation of Total Score  Total Score Depression Severity:  1-4 = Minimal depression, 5-9 = Mild depression, 10-14 = Moderate depression, 15-19 = Moderately severe depression, 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 Pt has no psychosocial issues. He scored a 0 on his PHQ-9. He does report that finances are a bit tight now, but he is not stressed about it. He has now been cleared by both cardiology and pulmonary to return to work, which he will on  Monday. He reports that he has a great support system with his family and his friends. Since his hospitial stay from his NSTEMI he has cleaned up his diet and has cut out friend foods and is limiting his consumption of sugary beverages. His goals while in the program are to continue to lose weight and decrease his SOB with exertion. He is eager to start the program and meet his goals.    Expected Outcomes The pt will continue to not have any identfiable psychosocial issues.    Continue Psychosocial Services  No Follow up required             Psychosocial Re-Evaluation:  Psychosocial Re-Evaluation     [place] Name 06/02/21 1414             Psychosocial Re-Evaluation   Current issues with None Identified       Comments Patient is new to the program completing 2 sessions. He continues  to have no psychosocial barriers identified. We will continue to monitor for progress.       Expected Outcomes Patient will continue to have no psychosocial issues identified.       Interventions Encouraged to attend Cardiac Rehabilitation for the exercise;Relaxation education;Stress management education       Continue Psychosocial Services  No Follow up required                Psychosocial Discharge (Final Psychosocial Re-Evaluation):  Psychosocial Re-Evaluation - 06/02/21 1414       Psychosocial Re-Evaluation   Current issues with None Identified    Comments Patient is new to the program completing 2 sessions. He continues to have no psychosocial barriers identified. We will continue to monitor for progress.    Expected Outcomes Patient will continue to have no psychosocial issues identified.    Interventions Encouraged to attend Cardiac Rehabilitation for the exercise;Relaxation education;Stress management education    Continue Psychosocial Services  No Follow up required             Vocational Rehabilitation: Provide vocational rehab assistance to qualifying candidates.   Vocational  Rehab Evaluation & Intervention:  Vocational Rehab - 05/30/21 1333       Initial Vocational Rehab Evaluation & Intervention   Assessment shows need for Vocational Rehabilitation No             Education: Education Goals: Education classes will be provided on a weekly basis, covering required topics. Participant will state understanding/return demonstration of topics presented.  Learning Barriers/Preferences:  Learning Barriers/Preferences - 05/30/21 1329       Learning Barriers/Preferences   Learning Barriers None    Learning Preferences Skilled Demonstration;Individual Instruction             Education Topics: Hypertension, Hypertension Reduction -Define heart disease and high blood pressure. Discus how high blood pressure affects the body and ways to reduce high blood pressure.   Exercise and Your Heart -Discuss why it is important to exercise, the FITT principles of exercise, normal and abnormal responses to exercise, and how to exercise safely. Flowsheet Row CARDIAC REHAB PHASE II EXERCISE from 06/11/2021 in [place]  Date 06/04/21  Educator DF  Instruction Review Code 2- Demonstrated Understanding       Angina -Discuss definition of angina, causes of angina, treatment of angina, and how to decrease risk of having angina. Flowsheet Row CARDIAC REHAB PHASE II EXERCISE from 06/11/2021 in [place]  Date 06/11/21  Educator DF  Instruction Review Code 2- Demonstrated Understanding       Cardiac Medications -Review what the following cardiac medications are used for, how they affect the body, and side effects that may occur when taking the medications.  Medications include Aspirin, Beta blockers, calcium channel blockers, ACE Inhibitors, angiotensin receptor blockers, diuretics, digoxin, and antihyperlipidemics.   Congestive Heart Failure -Discuss the definition of CHF, how to live with CHF, the signs and  symptoms of CHF, and how keep track of weight and sodium intake.   Heart Disease and Intimacy -Discus the effect sexual activity has on the heart, how changes occur during intimacy as we age, and safety during sexual activity.   Smoking Cessation / COPD -Discuss different methods to quit smoking, the health benefits of quitting smoking, and the definition of COPD.   Nutrition I: Fats -Discuss the types of cholesterol, what cholesterol does to the heart, and how cholesterol levels can be controlled.   Nutrition II: Labels -Discuss  the different components of food labels and how to read food label   Heart Parts/Heart Disease and PAD -Discuss the anatomy of the heart, the pathway of blood circulation through the heart, and these are affected by heart disease.   Stress I: Signs and Symptoms -Discuss the causes of stress, how stress may lead to anxiety and depression, and ways to limit stress.   Stress II: Relaxation -Discuss different types of relaxation techniques to limit stress.   Warning Signs of Stroke / TIA -Discuss definition of a stroke, what the signs and symptoms are of a stroke, and how to identify when someone is having stroke.   Knowledge Questionnaire Score:  Knowledge Questionnaire Score - 05/30/21 1329       Knowledge Questionnaire Score   Pre Score 20/24             Core Components/Risk Factors/Patient Goals at Admission:  Personal Goals and Risk Factors at Admission - 05/30/21 1333       Core Components/Risk Factors/Patient Goals on Admission    Weight Management Yes;Obesity;Weight Maintenance;Weight Loss    Intervention Weight Management: Develop a combined nutrition and exercise program designed to reach desired caloric intake, while maintaining appropriate intake of nutrient and fiber, sodium and fats, and appropriate energy expenditure required for the weight goal.;Weight Management: Provide education and appropriate resources to help  participant work on and attain dietary goals.;Weight Management/Obesity: Establish reasonable short term and long term weight goals.;Obesity: Provide education and appropriate resources to help participant work on and attain dietary goals.    Expected Outcomes Short Term: Continue to assess and modify interventions until short term weight is achieved;Long Term: Adherence to nutrition and physical activity/exercise program aimed toward attainment of established weight goal;Weight Maintenance: Understanding of the daily nutrition guidelines, which includes 25-35% calories from fat, 7% or less cal from saturated fats, less than 200mg  cholesterol, less than 1.5gm of sodium, & 5 or more servings of fruits and vegetables daily;Weight Loss: Understanding of general recommendations for a balanced deficit meal plan, which promotes 1-2 lb weight loss per week and includes a negative energy balance of [id number] kcal/d;Understanding recommendations for meals to include 15-35% energy as protein, 25-35% energy from fat, 35-60% energy from carbohydrates, less than 200mg  of dietary cholesterol, 20-35 gm of total fiber daily;Understanding of distribution of calorie intake throughout the day with the consumption of 4-5 meals/snacks    Improve shortness of breath with ADL's Yes    Intervention Provide education, individualized exercise plan and daily activity instruction to help decrease symptoms of SOB with activities of daily living.    Expected Outcomes Short Term: Improve cardiorespiratory fitness to achieve a reduction of symptoms when performing ADLs;Long Term: Be able to perform more ADLs without symptoms or delay the onset of symptoms    Heart Failure Yes    Intervention Provide a combined exercise and nutrition program that is supplemented with education, support and counseling about heart failure. Directed toward relieving symptoms such as shortness of breath, decreased exercise tolerance, and extremity edema.     Expected Outcomes Improve functional capacity of life;Short term: Attendance in program 2-3 days a week with increased exercise capacity. Reported lower sodium intake. Reported increased fruit and vegetable intake. Reports medication compliance.;Short term: Daily weights obtained and reported for increase. Utilizing diuretic protocols set by physician.;Long term: Adoption of self-care skills and reduction of barriers for early signs and symptoms recognition and intervention leading to self-care maintenance.    Hypertension Yes    Intervention Provide  education on lifestyle modifcations including regular physical activity/exercise, weight management, moderate sodium restriction and increased consumption of fresh fruit, vegetables, and low fat dairy, alcohol moderation, and smoking cessation.;Monitor prescription use compliance.    Expected Outcomes Short Term: Continued assessment and intervention until BP is < 140/35mm HG in hypertensive participants. < 130/90mm HG in hypertensive participants with diabetes, heart failure or chronic kidney disease.;Long Term: Maintenance of blood pressure at goal levels.    Lipids Yes    Intervention Provide education and support for participant on nutrition & aerobic/resistive exercise along with prescribed medications to achieve LDL 70mg , HDL >40mg .    Expected Outcomes Short Term: Participant states understanding of desired cholesterol values and is compliant with medications prescribed. Participant is following exercise prescription and nutrition guidelines.;Long Term: Cholesterol controlled with medications as prescribed, with individualized exercise RX and with personalized nutrition plan. Value goals: LDL < 70mg , HDL > 40 mg.             Core Components/Risk Factors/Patient Goals Review:   Goals and Risk Factor Review     Row Name 06/02/21 1416             Core Components/Risk Factors/Patient Goals Review   Personal Goals Review Other       Review  Paitent referred to CR with NSTEMI. He has multiple risk factors for CAD and is participating in the program for risk modification. He has completed 1 sessions. His personal goals for the program are to continue to lose weight and get stronger. We will continue to monitor his progress as he works toward meeting these personal goals.       Expected Outcomes Patient will complete the program meeting both personal and program goal.s                Core Components/Risk Factors/Patient Goals at Discharge (Final Review):   Goals and Risk Factor Review - 06/02/21 1416       Core Components/Risk Factors/Patient Goals Review   Personal Goals Review Other    Review Paitent referred to CR with NSTEMI. He has multiple risk factors for CAD and is participating in the program for risk modification. He has completed 1 sessions. His personal goals for the program are to continue to lose weight and get stronger. We will continue to monitor his progress as he works toward meeting these personal goals.    Expected Outcomes Patient will complete the program meeting both personal and program goal.s             ITP Comments:   Comments: ITP REVIEW ITP REVIEW Pt is making expected progress toward cardiac rehab goals after completing 6 sessions. Recommend continued exercise, life style modification and education to increase stamina and strength.

## 2021-06-11 NOTE — Progress Notes (Signed)
Daily Session Note  Patient Details  Name: Zachary Weeks MRN: [id number] Date of Birth: 1981/05/14 Referring Provider:   Flowsheet Row CARDIAC REHAB PHASE II ORIENTATION from 05/30/2021 in [place]  Referring Provider Dr. Grandville Silos       Encounter Date: 06/11/2021  Check In:  Session Check In - 06/11/21 0815       Check-In   Supervising physician immediately available to respond to emergencies [facility] MD immediately available    Physician(s) Dr. Domenic Polite    Location [facility]    Staff Present Hoy Register, MS, ACSM-CEP, Exercise Physiologist;Other    Virtual Visit No    Medication changes reported     No    Fall or balance concerns reported    No    Tobacco Cessation No Change    Warm-up and Cool-down Performed as group-led instruction    Resistance Training Performed Yes    VAD Patient? No    PAD/SET Patient? No      Pain Assessment   Currently in Pain? No/denies    Multiple Pain Sites No             Capillary Blood Glucose: No results found for this or any previous visit (from the past 24 hour(s)).    Social History   Tobacco Use  Smoking Status Former   Packs/day: 1.00   Years: 3.00   Pack years: 3.00   Types: Cigarettes   Quit date: 1999   Years since quitting: 23.5  Smokeless Tobacco Current   Types: Chew, Snuff    Goals Met:  Independence with exercise equipment Exercise tolerated well No report of cardiac concerns or symptoms Strength training completed today  Goals Unmet:  Not Applicable  Comments: checkout time is 0915   Dr. Kathie Dike is Medical Director for [facility].

## 2021-06-13 ENCOUNTER — Encounter (HOSPITAL_COMMUNITY)
Admission: RE | Admit: 2021-06-13 | Discharge: 2021-06-13 | Disposition: A | Discharge status: Home / Self Care | Payer: Managed Care, Other (non HMO) | Source: Ambulatory Visit | Attending: Cardiology | Admitting: Cardiology

## 2021-06-13 ENCOUNTER — Other Ambulatory Visit: Payer: Self-pay

## 2021-06-13 ENCOUNTER — Telehealth: Payer: Self-pay

## 2021-06-13 DIAGNOSIS — I214 Non-ST elevation (NSTEMI) myocardial infarction: Secondary | ICD-10-CM

## 2021-06-13 MED ORDER — AMLODIPINE BESYLATE 5 MG PO TABS: 5.0000 mg | ORAL_TABLET | Freq: Every day | ORAL | 2 refills | Status: DC

## 2021-06-13 NOTE — Telephone Encounter (Signed)
Called pt to inform pt about the message above pt has an appt on 08/22

## 2021-06-13 NOTE — Progress Notes (Signed)
Daily Session Note  Patient Details  Name: Zachary Weeks MRN: [id number] Date of Birth: 08/03/1981 Referring Provider:   Flowsheet Row CARDIAC REHAB PHASE II ORIENTATION from 05/30/2021 in [place] [facility]  Referring Provider Dr. Thompson       Encounter Date: 06/13/2021  Check In:  Session Check In - 06/13/21 0815       Check-In   Supervising physician immediately available to respond to emergencies [facility] MD immediately available    Physician(s) Dr. Branch    Location [facility]    Staff Present Dalton Fletcher, MS, ACSM-CEP, Exercise Physiologist;Other    Virtual Visit No    Medication changes reported     No    Fall or balance concerns reported    No    Tobacco Cessation No Change    Warm-up and Cool-down Performed as group-led instruction    Resistance Training Performed Yes    VAD Patient? No    PAD/SET Patient? No      Pain Assessment   Currently in Pain? No/denies    Multiple Pain Sites No             Capillary Blood Glucose: No results found for this or any previous visit (from the past 24 hour(s)).    Social History   Tobacco Use  Smoking Status Former   Packs/day: 1.00   Years: 3.00   Pack years: 3.00   Types: Cigarettes   Quit date: 1999   Years since quitting: 23.5  Smokeless Tobacco Current   Types: Chew, Snuff    Goals Met:  Independence with exercise equipment Exercise tolerated well No report of cardiac concerns or symptoms Strength training completed today  Goals Unmet:  Not Applicable  Comments: checkout time is 0915   Dr. Jehanzeb Memon is Medical Director for [facility]. 

## 2021-06-13 NOTE — Telephone Encounter (Signed)
Recommend adding amlodipine 5 mg daily. If patient agrees, please send 30 pillsX3 refills. Recommend f/u in 4-6 weeks.  Thanks MJP

## 2021-06-16 ENCOUNTER — Other Ambulatory Visit: Payer: Self-pay

## 2021-06-16 ENCOUNTER — Telehealth: Payer: Self-pay | Admitting: Pulmonary Disease

## 2021-06-16 ENCOUNTER — Emergency Department (HOSPITAL_COMMUNITY)
Admission: EM | Admit: 2021-06-16 | Discharge: 2021-06-16 | Disposition: A | Discharge status: Home / Self Care | Payer: Managed Care, Other (non HMO) | Attending: Emergency Medicine | Admitting: Emergency Medicine

## 2021-06-16 ENCOUNTER — Encounter (HOSPITAL_COMMUNITY): Payer: Self-pay

## 2021-06-16 ENCOUNTER — Emergency Department (HOSPITAL_COMMUNITY): Payer: Managed Care, Other (non HMO)

## 2021-06-16 ENCOUNTER — Encounter (HOSPITAL_COMMUNITY): Payer: Managed Care, Other (non HMO)

## 2021-06-16 DIAGNOSIS — I129 Hypertensive chronic kidney disease with stage 1 through stage 4 chronic kidney disease, or unspecified chronic kidney disease: Secondary | ICD-10-CM | POA: Diagnosis not present

## 2021-06-16 DIAGNOSIS — J45909 Unspecified asthma, uncomplicated: Secondary | ICD-10-CM | POA: Insufficient documentation

## 2021-06-16 DIAGNOSIS — I2510 Atherosclerotic heart disease of native coronary artery without angina pectoris: Secondary | ICD-10-CM | POA: Insufficient documentation

## 2021-06-16 DIAGNOSIS — N183 Chronic kidney disease, stage 3 unspecified: Secondary | ICD-10-CM | POA: Diagnosis not present

## 2021-06-16 DIAGNOSIS — Z87891 Personal history of nicotine dependence: Secondary | ICD-10-CM | POA: Insufficient documentation

## 2021-06-16 DIAGNOSIS — Z79899 Other long term (current) drug therapy: Secondary | ICD-10-CM | POA: Diagnosis not present

## 2021-06-16 DIAGNOSIS — R072 Precordial pain: Secondary | ICD-10-CM | POA: Diagnosis not present

## 2021-06-16 DIAGNOSIS — Z7982 Long term (current) use of aspirin: Secondary | ICD-10-CM | POA: Diagnosis not present

## 2021-06-16 DIAGNOSIS — Z955 Presence of coronary angioplasty implant and graft: Secondary | ICD-10-CM | POA: Insufficient documentation

## 2021-06-16 LAB — COMPREHENSIVE METABOLIC PANEL
ALT: 34 U/L (ref 0–44)
AST: 26 U/L (ref 15–41)
Albumin: 3.7 g/dL (ref 3.5–5.0)
Alkaline Phosphatase: 73 U/L (ref 38–126)
Anion gap: 9 (ref 5–15)
BUN: 11 mg/dL (ref 6–20)
CO2: 24 mmol/L (ref 22–32)
Calcium: 8.9 mg/dL (ref 8.9–10.3)
Chloride: 103 mmol/L (ref 98–111)
Creatinine, Ser: 1.37 mg/dL — ABNORMAL HIGH (ref 0.61–1.24)
GFR, Estimated: >60 mL/min (ref >60)
Glucose, Bld: 129 mg/dL — ABNORMAL HIGH (ref 70–99)
Potassium: 3.7 mmol/L (ref 3.5–5.1)
Sodium: 136 mmol/L (ref 135–145)
Total Bilirubin: 1.4 mg/dL — ABNORMAL HIGH (ref 0.3–1.2)
Total Protein: 7.1 g/dL (ref 6.5–8.1)

## 2021-06-16 LAB — CBC WITH DIFFERENTIAL/PLATELET
Abs Immature Granulocytes: 0.05 10*3/uL (ref 0.00–0.07)
Basophils Absolute: 0 10*3/uL (ref 0.0–0.1)
Basophils Relative: 1 %
Eosinophils Absolute: 0.2 10*3/uL (ref 0.0–0.5)
Eosinophils Relative: 3 %
HCT: 50.2 % (ref 39.0–52.0)
Hemoglobin: 16.7 g/dL (ref 13.0–17.0)
Immature Granulocytes: 1 %
Lymphocytes Relative: 19 %
Lymphs Abs: 1.4 10*3/uL (ref 0.7–4.0)
MCH: 28.8 pg (ref 26.0–34.0)
MCHC: 33.3 g/dL (ref 30.0–36.0)
MCV: 86.6 fL (ref 80.0–100.0)
Monocytes Absolute: 0.4 10*3/uL (ref 0.1–1.0)
Monocytes Relative: 6 %
Neutro Abs: 5.2 10*3/uL (ref 1.7–7.7)
Neutrophils Relative %: 70 %
Platelets: 209 10*3/uL (ref 150–400)
RBC: 5.8 MIL/uL (ref 4.22–5.81)
RDW: 13.2 % (ref 11.5–15.5)
WBC: 7.3 10*3/uL (ref 4.0–10.5)
nRBC: 0 % (ref 0.0–0.2)

## 2021-06-16 LAB — TROPONIN I (HIGH SENSITIVITY)
Troponin I (High Sensitivity): 8 ng/L (ref <18)
Troponin I (High Sensitivity): 7 ng/L (ref <18)

## 2021-06-16 MED ORDER — NITROGLYCERIN 0.4 MG SL SUBL
0.4000 mg | SUBLINGUAL_TABLET | SUBLINGUAL | Status: DC | PRN
  Administered 2021-06-16: 0.4 mg via SUBLINGUAL
  Filled 2021-06-16: qty 1

## 2021-06-16 MED ORDER — IOHEXOL 350 MG/ML SOLN
100.0000 mL | Freq: Once | INTRAVENOUS | Status: AC | PRN
  Administered 2021-06-16: 100 mL via INTRAVENOUS

## 2021-06-16 MED ORDER — ASPIRIN 81 MG PO CHEW
243.0000 mg | CHEWABLE_TABLET | Freq: Once | ORAL | Status: AC
  Administered 2021-06-16: 243 mg via ORAL
  Filled 2021-06-16: qty 3

## 2021-06-16 NOTE — ED Provider Notes (Signed)
[facility] EMERGENCY DEPARTMENT Provider Note   CSN: [id number] Arrival date & time: 06/16/21  [id number]     History Chief Complaint  Patient presents with   Chest Pain    Zachary Weeks is a 40 y.o. male with a history of DVT, hypertension, CAD, NSTEMI, CKD stage III, hyperlipidemia.  Patient presents with a chief complaint of chest pain.  Patient reports that pain started after waking this morning at approximately 0200-0300.  Patient reports pain as midsternal.  Pain does not radiate.  Pain is described as a pressure.  Patient rates pain 5/10 on the pain scale.  Pain is only present with breathing.  Pain is relieved when holding his breath.  No associated shortness of breath, nausea, vomiting, or diaphoresis.  Patient took 81 mg of aspirin prior to arrival in the emergency department.  Patient has not tried taking any sublingual nitro.  Patient denies any leg swelling, palpitations, hemoptysis, abdominal pain, URI symptoms, syncope, lightheadedness.  Patient states that he has been taking all of his medications as prescribed.  Patient states that he is currently seeing an oncologist due to enlarged lymph nodes in his chest, patient has not had a biopsy of these lymph nodes yet.   Chest Pain Associated symptoms: no abdominal pain, no back pain, no cough, no dizziness, no fever, no headache, no nausea, no numbness, no palpitations, no shortness of breath, no vomiting and no weakness       Past Medical History:  Diagnosis Date   Asthma    DVT (deep venous thrombosis) (HCC)    Dyspnea    at times - no known reason.    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   Pneumonia    54- 37 years of age    Patient Active Problem List   Diagnosis Date Noted   Acute respiratory failure with hypoxia ([place]) 05/30/2021   Coronary artery disease involving native coronary artery of native heart without angina  pectoris 05/13/2021   HTN (hypertension) 05/06/2021   Hypoxia 05/06/2021   GERD (gastroesophageal reflux disease) 05/06/2021   Hyperlipidemia 05/06/2021   Hyperglycemia 05/06/2021   NSTEMI (non-ST elevated myocardial infarction) ([place]) 05/05/2021   Hilar adenopathy 10/18/2020   OSA (obstructive sleep apnea) 10/18/2020   CKD (chronic kidney disease) stage 3, GFR 30-59 ml/min ([place]) 10/18/2020   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 ENDOSCOPY;  Service: Cardiopulmonary;;   INTRAVASCULAR ULTRASOUND/IVUS N/A 05/05/2021   Procedure: Intravascular Ultrasound/IVUS;  Surgeon: Nigel Mormon, MD;  Location: [place] CV LAB;  Service: Cardiovascular;  Laterality: N/A;   LEFT HEART CATH AND CORONARY ANGIOGRAPHY N/A 05/05/2021   Procedure: LEFT HEART CATH AND CORONARY ANGIOGRAPHY;  Surgeon: Nigel Mormon, MD;  Location: [place] CV LAB;  Service: Cardiovascular;  Laterality: N/A;   NO PAST SURGERIES     VIDEO BRONCHOSCOPY N/A 11/01/2020   Procedure: VIDEO BRONCHOSCOPY WITH ENDOBRONCHIAL ULTRASOUND;  Surgeon: Rigoberto Noel, MD;  Location: [facility];  Service: Cardiopulmonary;  Laterality: N/A;       Family History  Problem Relation Age of  Onset   Pulmonary fibrosis Mother    Cancer Mother    Cancer Father        prostate   Hypertension Father    Fibromyalgia Maternal Aunt    Lupus Maternal Grandmother    Other Maternal Grandmother        clotting disorder   Other Other        clotting disorder    Social History   Tobacco Use   Smoking status: Former    Packs/day: 1.00    Years: 3.00    Pack years: 3.00    Types: Cigarettes     Quit date: 1999    Years since quitting: 23.5   Smokeless tobacco: Current    Types: Chew, Snuff  Vaping Use   Vaping Use: Never used  Substance Use Topics   Alcohol use: Yes    Comment: rarely- maybe 2 mixed drinks in a year   Drug use: Never    Home Medications Prior to Admission medications   Medication Sig Start Date End Date Taking? Authorizing Provider  albuterol (VENTOLIN HFA) 108 (90 Base) MCG/ACT inhaler Inhale 1-2 puffs into the lungs every 6 (six) hours as needed for wheezing or shortness of breath.    [provider]  amLODipine (NORVASC) 5 MG tablet Take 1 tablet (5 mg total) by mouth daily. 06/13/21 07/13/21  Nigel Mormon, MD  aspirin 81 MG EC tablet Take 1 tablet (81 mg total) by mouth daily. Swallow whole. 05/07/21   Patwardhan, Reynold Bowen, MD  atorvastatin (LIPITOR) 40 MG tablet Take 1 tablet (40 mg total) by mouth daily. 05/07/21   Patwardhan, Reynold Bowen, MD  cholecalciferol (VITAMIN D) 25 MCG (1000 UNIT) tablet Take 1,000 Units by mouth daily. 01/17/21   [provider]  cyclobenzaprine (FLEXERIL) 10 MG tablet Take by mouth. 04/26/21   [provider]  metoprolol tartrate (LOPRESSOR) 25 MG tablet Take 1 tablet (25 mg total) by mouth 2 (two) times daily. 05/07/21   Patwardhan, Reynold Bowen, MD  nitroGLYCERIN (NITROSTAT) 0.4 MG SL tablet Place 1 tablet (0.4 mg total) under the tongue every 5 (five) minutes as needed for chest pain. 05/07/21   Patwardhan, Reynold Bowen, MD  pantoprazole (PROTONIX) 40 MG tablet Take 40 mg by mouth daily.    [provider]  ticagrelor (BRILINTA) 90 MG TABS tablet Take 1 tablet (90 mg total) by mouth 2 (two) times daily. 05/07/21   Patwardhan, Reynold Bowen, MD    Allergies    Patient has no known allergies.  Review of Systems   Review of Systems  Constitutional:  Negative for chills and fever.  HENT:  Negative for congestion, rhinorrhea and sore throat.   Eyes:  Negative for visual disturbance.  Respiratory:   Negative for cough and shortness of breath.   Cardiovascular:  Positive for chest pain. Negative for palpitations and leg swelling.  Gastrointestinal:  Negative for abdominal pain, nausea and vomiting.  Genitourinary:  Negative for difficulty urinating and dysuria.  Musculoskeletal:  Negative for back pain and neck pain.  Skin:  Negative for color change and rash.  Neurological:  Negative for dizziness, syncope, weakness, light-headedness, numbness and headaches.  Psychiatric/Behavioral:  Negative for confusion.    Physical Exam Updated Vital Signs BP (!) 138/103 (BP Location: Right Arm)   Pulse 82   Temp (!) 97.1 F (36.2 C) (Oral)   Resp 18   Ht 6' (1.829 m)   Wt (!) 145.2 kg   SpO2 94%  BMI 43.40 kg/m   Physical Exam Vitals and nursing note reviewed.  Constitutional:      General: He is not in acute distress.    Appearance: He is not ill-appearing, toxic-appearing or diaphoretic.  HENT:     Head: Normocephalic.  Eyes:     General: No scleral icterus.       Right eye: No discharge.        Left eye: No discharge.  Cardiovascular:     Rate and Rhythm: Normal rate.     Pulses:          Radial pulses are 1+ on the right side and 1+ on the left side.     Heart sounds: Normal heart sounds, S1 normal and S2 normal.  Pulmonary:     Effort: Pulmonary effort is normal. No respiratory distress.     Breath sounds: Normal breath sounds. No stridor.  Abdominal:     General: There is no distension.     Palpations: Abdomen is soft. There is no mass or pulsatile mass.     Tenderness: There is no abdominal tenderness. There is no guarding or rebound.  Musculoskeletal:     Cervical back: Neck supple.     Right lower leg: No swelling or tenderness. No edema.     Left lower leg: No swelling or tenderness. No edema.  Skin:    General: Skin is warm and dry.     Coloration: Skin is not cyanotic or pale.  Neurological:     General: No focal deficit present.     Mental Status: He is  alert.  Psychiatric:        Behavior: Behavior is cooperative.    ED Results / Procedures / Treatments   Labs (all labs ordered are listed, but only abnormal results are displayed) Labs Reviewed  COMPREHENSIVE METABOLIC PANEL - Abnormal; Notable for the following components:      Result Value   Glucose, Bld 129 (*)    Creatinine, Ser 1.37 (*)    Total Bilirubin 1.4 (*)    All other components within normal limits  CBC WITH DIFFERENTIAL/PLATELET  TROPONIN I (HIGH SENSITIVITY)  TROPONIN I (HIGH SENSITIVITY)    EKG EKG Interpretation  Date/Time:  Monday June 16 2021 08:45:42 EDT Ventricular Rate:  76 PR Interval:  160 QRS Duration: 83 QT Interval:  403 QTC Calculation: 454 R Axis:   51 Text Interpretation: Sinus rhythm Confirmed by Fredia Sorrow [phone]) on 06/16/2021 8:56:31 AM  Radiology CT Angio Chest PE W and/or Wo Contrast  Result Date: 06/16/2021 CLINICAL DATA:  Chest pressure and pain for the past day. History of asthma, DVT and hypertension. Evaluate for pulmonary embolism. Former smoker. History of CML. EXAM: CT ANGIOGRAPHY CHEST WITH CONTRAST TECHNIQUE: Multidetector CT imaging of the chest was performed using the standard protocol during bolus administration of intravenous contrast. Multiplanar CT image reconstructions and MIPs were obtained to evaluate the vascular anatomy. CONTRAST:  11m OMNIPAQUE IOHEXOL 350 MG/ML SOLN COMPARISON:  Chest CT-05/05/2021; 08/23/2020; 11/13/2019 PET-CT 04/24/2020 (outside examination) Bilateral lower extremity venous Doppler ultrasound-10/31/2019 (positive for age-indeterminate near occlusive DVT involving both paired right posterior tibial veins). FINDINGS: Vascular Findings: There is adequate opacification of the pulmonary arterial system with the main pulmonary artery measuring 320 Hounsfield units. There are no discrete filling defects within the pulmonary arterial tree to suggest pulmonary embolism. Normal caliber of the main  pulmonary artery. Borderline cardiomegaly. No pericardial effusion. Minimal coronary artery calcifications. No evidence of thoracic aortic aneurysm or  dissection on this nongated examination. Bovine configuration of the aortic arch. The branch vessels of the aortic arch appear widely patent throughout their imaged courses. Review of the MIP images confirms the above findings. ---------------------------------------------------------------------------------- Nonvascular Findings: Mediastinum/Lymph Nodes: Redemonstrated mediastinal and bilateral hilar lymphadenopathy, with slight increase in size of index subcarinal nodal conglomeration now measuring 2.4 cm in diameter (image 43, series 4), previously, 1.9 cm when compared to the 10/2019 examination. Remaining mediastinal and hilar lymph nodes appear grossly unchanged compared to the 10/2019 examination with index AP window lymph node measuring 1.5 cm in greatest short axis diameter (image 33, series 4), index left hilar lymph node measuring 1.3 cm (image 43), index right suprahilar lymph node measuring 1.7 cm (image 41) and index right-sided paratracheal lymph node measuring 1.3 cm (image 31). Lungs/Pleura: Minimal right lower lobe predominant subpleural ground-glass atelectasis. Grossly unchanged subpleural pleuroparenchymal thickening involving the medial aspect the right middle lobe as well as the caudal aspect of the lingula, similar to the 10/2019 examination. No discrete focal airspace opacities. No pleural effusion or pneumothorax. The central pulmonary airways appear widely patent. No discrete pulmonary nodules. Upper abdomen: Limited early arterial phase evaluation of the upper abdomen demonstrates diffuse decreased attenuation hepatic parenchyma suggestive of hepatic steatosis. Incidental note is made of a small splenule. Musculoskeletal: No acute or aggressive osseous abnormalities. Stable mild (approximately 25%) compression deformity involving the T9  vertebral body. Stigmata of dish within the caudal aspect of the thoracic spine. Mild symmetric bilateral gynecomastia. Normal appearance of the imaged portions of the thyroid gland. IMPRESSION: 1. No acute cardiopulmonary disease. Specifically, no evidence of pulmonary embolism. 2. 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 3. Borderline cardiomegaly, unchanged. 4. Minimal coronary artery calcifications. Aortic Atherosclerosis (ICD10-I70.0). 5. Suspected hepatic steatosis.  Correlation with LFTs is advised. Electronically Signed   By: Sandi Mariscal M.D.   On: 06/16/2021 10:36   DG Chest Portable 1 View  Result Date: 06/16/2021 CLINICAL DATA:  Chest pain. EXAM: PORTABLE CHEST 1 VIEW COMPARISON:  May 06, 2021. FINDINGS: The heart size and mediastinal contours are within normal limits. Both lungs are clear. The visualized skeletal structures are unremarkable. IMPRESSION: No active disease. Electronically Signed   By: Marijo Conception M.D.   On: 06/16/2021 09:35    Procedures Procedures   Medications Ordered in ED Medications  aspirin chewable tablet 243 mg (243 mg Oral Given 06/16/21 0938)  iohexol (OMNIPAQUE) 350 MG/ML injection 100 mL (100 mLs Intravenous Contrast Given 06/16/21 1005)    ED Course  I have reviewed the triage vital signs and the nursing notes.  Pertinent labs & imaging results that were available during my care of the patient were reviewed by me and considered in my medical decision making (see chart for details).  Clinical Course as of 06/16/21 2215  Mon Jun 16, 2021  1222 Spoke to cardiologist Dr.Tolia  [PB]    Clinical Course User Index [PB] Dyann Ruddle   MDM Rules/Calculators/A&P                            Alert 40 year old male in no acute distress, nontoxic-appearing.  Patient has  recent history of NSTEMI, cardiac catheterization performed 6/14 by Dr. Virgina Jock with [facility] cardiovascular; showed 40%  stenosis to LAD.  Due to patient's chest pain will initiate ACS work-up. EKG shows normal sinus rhythm, Chest x-ray shows no active cardiopulmonary disease Troponin 8  and 7 with delta of -1 Patient was given 243 mg of aspirin and 1 sublingual nitro.  Patient had no change in pain with these modalities. Patient's recent NSTEMI will consult with cardiology team. Spoke to Dr. Terri Skains who reviewed patient's case, imaging, and lab work.  Advised that patient follow-up with cardiology outpatient setting.  I will schedule patient's next cardiology appointment.  Due to patient's history of DVT and possible malignancy will obtain CTA of chest to evaluate for PE. CTA shows: -No acute cardiopulmonary disease. -No evidence of PE -Mediastinal and bilateral hilar lymphadenopathy with slight increase in size -Borderline cardiomegaly -Suspected hepatic steatosis  Will discharge patient at this time.  Discussed results, findings, treatment and follow up. Patient advised of return precautions. Patient verbalized understanding and agreed with plan.   Final Clinical Impression(s) / ED Diagnoses Final diagnoses:  Precordial chest pain    Rx / DC Orders ED Discharge Orders     None        Dyann Ruddle 06/16/21 2220    Fredia Sorrow, MD 06/20/21 [phone]

## 2021-06-16 NOTE — Telephone Encounter (Signed)
Left message for patient to call back  

## 2021-06-16 NOTE — ED Triage Notes (Signed)
Pain in his chest since last night, states it feel like a pushing pain/aching pain, pt alert and oriented, skin warm and dry,

## 2021-06-16 NOTE — Telephone Encounter (Signed)
Patient is returning phone call. Patient phone number is [phone].

## 2021-06-16 NOTE — Telephone Encounter (Signed)
Called and spoke with the pt  He states woke up in the night last night with chest pressure and SOB  Went to [facility]- CTAngio done to r/o PE which was neg  He states that they told him there were some increased lymph nodes  Impression on scan:  IMPRESSION: 1. No acute cardiopulmonary disease. Specifically, no evidence of pulmonary embolism. 2. 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 3. Borderline cardiomegaly, unchanged. 4. Minimal coronary artery calcifications. Aortic Atherosclerosis (ICD10-I70.0). 5. Suspected hepatic steatosis.  Correlation with LFTs is advised.     Electronically Signed   By: Sandi Mariscal M.D.   On: 06/16/2021 10:36  He states still not received a call about cardiothoracic appt  I looked at referral notes and it looks like Dr Kipp Brood reviewing his records as of 06/12/21 Dr Elsworth Soho, pt wants to know your thoughts on CT  Please advise thanks

## 2021-06-16 NOTE — ED Notes (Signed)
Patient refused second dose of nitro due to headache. Patient educated on importance of nitro and chest pain but states chest pain did not get any better with nitro and further refused nitro second dose.

## 2021-06-16 NOTE — Telephone Encounter (Signed)
ATC x1.  LVM to return call. 

## 2021-06-16 NOTE — Telephone Encounter (Signed)
  They have the referral and have it in review it usually doesn't take them long to get a response back

## 2021-06-16 NOTE — Discharge Instructions (Addendum)
You came to the emergency department today to have your chest pain evaluated.  The scan of your chest did not show any blood clots.  It did show that your lymph nodes within your chest have grown slightly in size.  Your EKG, lab work, and chest x-ray were reassuring that you are not having acute heart attack today.  I spoke with your cardiologist who will contact you to have your next appointment moved up.  Get help right away if: Your chest pain gets worse. You have a cough that gets worse, or you cough up blood. You have severe pain in your abdomen. You faint. You have sudden, unexplained chest discomfort. You have sudden, unexplained discomfort in your arms, back, neck, or jaw. You have shortness of breath at any time. You suddenly start to sweat, or your skin gets clammy. You feel nausea or you vomit. You suddenly feel lightheaded or dizzy. You have severe weakness, or unexplained weakness or fatigue. Your heart begins to beat quickly, or it feels like it is skipping beats.

## 2021-06-16 NOTE — Telephone Encounter (Signed)
Called and spoke with patient. He verbalized understanding. I advised him that since the referral was placed back in early July, he should have heard something from Dr. Abran Duke office by now. I will reach out to the PCCs to see if perhaps we will need to change the referral to urgent in hopes he will get an appt soon.   PCCs, can you all advise on the TCTS referral? Do we need to change the referral to urgent since it has been almost 3 weeks?

## 2021-06-17 MED ORDER — PREDNISONE 10 MG PO TABS: ORAL_TABLET | ORAL | 0 refills | Status: AC

## 2021-06-17 NOTE — Telephone Encounter (Signed)
Called and spoke with patient to let him know that I have called Dr. Elliot Gault office to check on the referral and had to leave a message for the referral coordinator. Patient states that his O2 levels have been in the 80's some this morning and as high as 93%. Advised him to monitor his oxygen to make sure he is staying at 90% or higher and if he is dropping in the 80's and staying there he will need to go to the emergency room for further evaluation.   Dr. Elsworth Soho please advise on any other recommendations.

## 2021-06-17 NOTE — Telephone Encounter (Signed)
Patient is returning phone call. Patient phone number is [phone].

## 2021-06-17 NOTE — Telephone Encounter (Signed)
Spoke with pt who and informed pt of medication ordered. Pt stated understanding. Pt also scheduled for OV in [place] and given RT phone number to schedule PFT at [facility]. Pt stated understanding. Prednisone order place. Nothing further needed at this time.

## 2021-06-18 ENCOUNTER — Other Ambulatory Visit: Payer: Self-pay

## 2021-06-18 ENCOUNTER — Encounter (HOSPITAL_COMMUNITY)
Admission: RE | Admit: 2021-06-18 | Discharge: 2021-06-18 | Disposition: A | Discharge status: Home / Self Care | Payer: Managed Care, Other (non HMO) | Source: Ambulatory Visit | Attending: Cardiology | Admitting: Cardiology

## 2021-06-18 DIAGNOSIS — I214 Non-ST elevation (NSTEMI) myocardial infarction: Secondary | ICD-10-CM | POA: Diagnosis not present

## 2021-06-18 NOTE — Progress Notes (Signed)
Daily Session Note  Patient Details  Name: Zachary Weeks MRN: [id number] Date of Birth: November 08, 1981 Referring Provider:   Flowsheet Row CARDIAC REHAB PHASE II ORIENTATION from 05/30/2021 in [place]  Referring Provider Dr. Grandville Silos       Encounter Date: 06/18/2021  Check In:  Session Check In - 06/18/21 0839       Check-In   Supervising physician immediately available to respond to emergencies [facility] MD immediately available    Physician(s) Dr. Harl Bowie    Location [facility]    Staff Present Geanie Cooley, RN;Dalton Fletcher, MS, ACSM-CEP, Exercise Physiologist    Virtual Visit No    Medication changes reported     Yes    Comments Started on prednisone 20 mg x 5 days then 12m for 5 days    Fall or balance concerns reported    No    Tobacco Cessation No Change    Warm-up and Cool-down Performed as group-led instruction    Resistance Training Performed Yes    VAD Patient? No    PAD/SET Patient? No      Pain Assessment   Currently in Pain? No/denies    Pain Score 0-No pain    Multiple Pain Sites No             Capillary Blood Glucose: No results found for this or any previous visit (from the past 24 hour(s)).    Social History   Tobacco Use  Smoking Status Former   Packs/day: 1.00   Years: 3.00   Pack years: 3.00   Types: Cigarettes   Quit date: 1999   Years since quitting: 23.5  Smokeless Tobacco Current   Types: Chew, Snuff    Goals Met:  Independence with exercise equipment Exercise tolerated well No report of cardiac concerns or symptoms Strength training completed today  Goals Unmet:  Not Applicable  Comments: check out @ 9:15am   Dr. JKathie Dikeis Medical Director [facility].

## 2021-06-20 ENCOUNTER — Encounter (HOSPITAL_COMMUNITY)
Admission: RE | Admit: 2021-06-20 | Discharge: 2021-06-20 | Disposition: A | Discharge status: Home / Self Care | Payer: Managed Care, Other (non HMO) | Source: Ambulatory Visit | Attending: Cardiology | Admitting: Cardiology

## 2021-06-20 ENCOUNTER — Other Ambulatory Visit: Payer: Self-pay

## 2021-06-20 ENCOUNTER — Telehealth: Payer: Self-pay

## 2021-06-20 DIAGNOSIS — I214 Non-ST elevation (NSTEMI) myocardial infarction: Secondary | ICD-10-CM

## 2021-06-20 NOTE — Progress Notes (Signed)
Daily Session Note  Patient Details  Name: Zachary Weeks MRN: [id number] Date of Birth: [birth year] Referring Provider:   Flowsheet Row CARDIAC REHAB PHASE II ORIENTATION from 05/30/2021 in [place]  Referring Provider Dr. Grandville Silos       Encounter Date: 06/20/2021  Check In:  Session Check In - 06/20/21 0815       Check-In   Physician(s) Dr. Harl Bowie    Location [facility]    Staff Present Geanie Cooley, RN;Dalton Fletcher, MS, ACSM-CEP, Exercise Physiologist    Virtual Visit No    Medication changes reported     No    Fall or balance concerns reported    No    Tobacco Cessation No Change    Warm-up and Cool-down Performed as group-led instruction    Resistance Training Performed Yes    VAD Patient? No    PAD/SET Patient? No      Pain Assessment   Currently in Pain? No/denies    Pain Score 0-No pain    Multiple Pain Sites No             Capillary Blood Glucose: No results found for this or any previous visit (from the past 24 hour(s)).    Social History   Tobacco Use  Smoking Status Former   Packs/day: 1.00   Years: 3.00   Pack years: 3.00   Types: Cigarettes   Quit date: 1999   Years since quitting: 23.5  Smokeless Tobacco Current   Types: Chew, Snuff    Goals Met:  Independence with exercise equipment Exercise tolerated well No report of cardiac concerns or symptoms Strength training completed today  Goals Unmet:  Not Applicable  Comments: check out @ 9:15am   Dr. Kathie Dike is Medical Director for [facility] Pulmonary Rehab.

## 2021-06-21 LAB — LIPID PANEL
Chol/HDL Ratio: 4.2 ratio (ref 0.0–5.0)
Cholesterol, Total: 152 mg/dL (ref 100–199)
HDL: 36 mg/dL — ABNORMAL LOW (ref >39)
LDL Chol Calc (NIH): 93 mg/dL (ref 0–99)
Triglycerides: 130 mg/dL (ref 0–149)
VLDL Cholesterol Cal: 23 mg/dL (ref 5–40)

## 2021-06-23 ENCOUNTER — Encounter (HOSPITAL_COMMUNITY)
Admission: RE | Admit: 2021-06-23 | Discharge: 2021-06-23 | Disposition: A | Discharge status: Home / Self Care | Payer: Managed Care, Other (non HMO) | Source: Ambulatory Visit | Attending: Cardiology | Admitting: Cardiology

## 2021-06-23 ENCOUNTER — Other Ambulatory Visit (HOSPITAL_COMMUNITY)
Admission: RE | Admit: 2021-06-23 | Discharge: 2021-06-23 | Disposition: A | Discharge status: Home / Self Care | Payer: Managed Care, Other (non HMO) | Source: Ambulatory Visit | Attending: Pulmonary Disease | Admitting: Pulmonary Disease

## 2021-06-23 ENCOUNTER — Other Ambulatory Visit: Payer: Self-pay

## 2021-06-23 VITALS — Wt 318.3 lb

## 2021-06-23 DIAGNOSIS — I252 Old myocardial infarction: Secondary | ICD-10-CM | POA: Diagnosis not present

## 2021-06-23 DIAGNOSIS — Z20822 Contact with and (suspected) exposure to covid-19: Secondary | ICD-10-CM | POA: Diagnosis not present

## 2021-06-23 DIAGNOSIS — I10 Essential (primary) hypertension: Secondary | ICD-10-CM | POA: Insufficient documentation

## 2021-06-23 DIAGNOSIS — Z01812 Encounter for preprocedural laboratory examination: Secondary | ICD-10-CM | POA: Insufficient documentation

## 2021-06-23 DIAGNOSIS — R590 Localized enlarged lymph nodes: Secondary | ICD-10-CM | POA: Diagnosis not present

## 2021-06-23 DIAGNOSIS — Z7982 Long term (current) use of aspirin: Secondary | ICD-10-CM | POA: Insufficient documentation

## 2021-06-23 DIAGNOSIS — G4733 Obstructive sleep apnea (adult) (pediatric): Secondary | ICD-10-CM | POA: Insufficient documentation

## 2021-06-23 DIAGNOSIS — Z86718 Personal history of other venous thrombosis and embolism: Secondary | ICD-10-CM | POA: Insufficient documentation

## 2021-06-23 DIAGNOSIS — Z87891 Personal history of nicotine dependence: Secondary | ICD-10-CM | POA: Insufficient documentation

## 2021-06-23 DIAGNOSIS — Z79899 Other long term (current) drug therapy: Secondary | ICD-10-CM | POA: Diagnosis not present

## 2021-06-23 DIAGNOSIS — Z01818 Encounter for other preprocedural examination: Secondary | ICD-10-CM | POA: Insufficient documentation

## 2021-06-23 DIAGNOSIS — Z5189 Encounter for other specified aftercare: Secondary | ICD-10-CM | POA: Diagnosis not present

## 2021-06-23 DIAGNOSIS — I214 Non-ST elevation (NSTEMI) myocardial infarction: Secondary | ICD-10-CM

## 2021-06-23 DIAGNOSIS — I2510 Atherosclerotic heart disease of native coronary artery without angina pectoris: Secondary | ICD-10-CM | POA: Insufficient documentation

## 2021-06-23 LAB — SARS CORONAVIRUS 2 (TAT 6-24 HRS): SARS Coronavirus 2: NEGATIVE

## 2021-06-23 NOTE — Progress Notes (Signed)
Daily Session Note  Patient Details  Name: Zachary Weeks MRN: [id number] Date of Birth: August 16, 1981 Referring Provider:   Flowsheet Row CARDIAC REHAB PHASE II ORIENTATION from 05/30/2021 in [place]  Referring Provider Dr. Grandville Silos       Encounter Date: 06/23/2021  Check In:  Session Check In - 06/23/21 0815       Check-In   Supervising physician immediately available to respond to emergencies [facility] MD immediately available    Physician(s) Dr. Domenic Polite    Location [facility]    Staff Present Hoy Register, MS, ACSM-CEP, Exercise Physiologist;Debra Wynetta Emery, RN, BSN    Virtual Visit No    Medication changes reported     No    Fall or balance concerns reported    No    Tobacco Cessation No Change    Warm-up and Cool-down Performed as group-led instruction    Resistance Training Performed Yes    VAD Patient? No    PAD/SET Patient? No      Pain Assessment   Currently in Pain? No/denies    Pain Score 0-No pain    Multiple Pain Sites No             Capillary Blood Glucose: No results found for this or any previous visit (from the past 24 hour(s)).    Social History   Tobacco Use  Smoking Status Former   Packs/day: 1.00   Years: 3.00   Pack years: 3.00   Types: Cigarettes   Quit date: 1999   Years since quitting: 23.5  Smokeless Tobacco Current   Types: Chew, Snuff    Goals Met:  Independence with exercise equipment Exercise tolerated well No report of cardiac concerns or symptoms Strength training completed today  Goals Unmet:  Not Applicable  Comments: checkout time is 0915   Dr. Kathie Dike is Medical Director for [facility] Pulmonary Rehab.

## 2021-06-25 ENCOUNTER — Other Ambulatory Visit: Payer: Self-pay

## 2021-06-25 ENCOUNTER — Encounter (HOSPITAL_COMMUNITY)
Admission: RE | Admit: 2021-06-25 | Discharge: 2021-06-25 | Disposition: A | Discharge status: Home / Self Care | Payer: Managed Care, Other (non HMO) | Source: Ambulatory Visit | Attending: Cardiology | Admitting: Cardiology

## 2021-06-25 DIAGNOSIS — I214 Non-ST elevation (NSTEMI) myocardial infarction: Secondary | ICD-10-CM

## 2021-06-25 DIAGNOSIS — Z5189 Encounter for other specified aftercare: Secondary | ICD-10-CM | POA: Diagnosis not present

## 2021-06-25 NOTE — Progress Notes (Signed)
Daily Session Note  Patient Details  Name: Zachary Weeks MRN: [id number] Date of Birth: 06/28/1981 Referring Provider:   Flowsheet Row CARDIAC REHAB PHASE II ORIENTATION from 05/30/2021 in [place]  Referring Provider Dr. Grandville Silos       Encounter Date: 06/25/2021  Check In:  Session Check In - 06/25/21 0833       Check-In   Supervising physician immediately available to respond to emergencies [facility] MD immediately available    Physician(s) Dr. Domenic Polite    Location [facility]    Staff Present Hoy Register, MS, ACSM-CEP, Exercise Physiologist;Debra Wynetta Emery, RN, BSN    Virtual Visit No    Medication changes reported     No    Fall or balance concerns reported    No    Tobacco Cessation No Change    Warm-up and Cool-down Performed as group-led instruction    Resistance Training Performed Yes    VAD Patient? No    PAD/SET Patient? No      Pain Assessment   Currently in Pain? No/denies    Pain Score 0-No pain    Multiple Pain Sites No             Capillary Blood Glucose: No results found for this or any previous visit (from the past 24 hour(s)).    Social History   Tobacco Use  Smoking Status Former   Packs/day: 1.00   Years: 3.00   Pack years: 3.00   Types: Cigarettes   Quit date: 1999   Years since quitting: 23.6  Smokeless Tobacco Current   Types: Chew, Snuff    Goals Met:  Independence with exercise equipment Exercise tolerated well No report of cardiac concerns or symptoms Strength training completed today  Goals Unmet:  Not Applicable  Comments: checkout time is 0915   Dr. Kathie Dike is Medical Director for [facility] Pulmonary Rehab.

## 2021-06-25 NOTE — Telephone Encounter (Signed)
error 

## 2021-06-26 ENCOUNTER — Ambulatory Visit (HOSPITAL_COMMUNITY)
Admission: RE | Admit: 2021-06-26 | Discharge: 2021-06-26 | Disposition: A | Discharge status: Home / Self Care | Payer: Managed Care, Other (non HMO) | Source: Ambulatory Visit | Attending: Pulmonary Disease | Admitting: Pulmonary Disease

## 2021-06-26 ENCOUNTER — Ambulatory Visit: Payer: Managed Care, Other (non HMO) | Admitting: Cardiology

## 2021-06-26 ENCOUNTER — Encounter: Payer: Self-pay | Admitting: Cardiology

## 2021-06-26 VITALS — BP 135/97 | HR 66 | Temp 98.7°F | Resp 16 | Ht 72.0 in | Wt 326.0 lb

## 2021-06-26 DIAGNOSIS — I472 Ventricular tachycardia: Secondary | ICD-10-CM

## 2021-06-26 DIAGNOSIS — I4729 Other ventricular tachycardia: Secondary | ICD-10-CM

## 2021-06-26 DIAGNOSIS — R0602 Shortness of breath: Secondary | ICD-10-CM | POA: Insufficient documentation

## 2021-06-26 DIAGNOSIS — I2510 Atherosclerotic heart disease of native coronary artery without angina pectoris: Secondary | ICD-10-CM

## 2021-06-26 LAB — PULMONARY FUNCTION TEST
DL/VA % pred: 120 %
DL/VA: 5.49 ml/min/mmHg/L
DLCO cor % pred: 113 %
DLCO cor: 36.93 ml/min/mmHg
DLCO unc % pred: 119 %
DLCO unc: 38.95 ml/min/mmHg
FEF 25-75 Post: 5.34 L/sec
FEF 25-75 Pre: 4.41 L/sec
FEF2575-%Change-Post: 21 %
FEF2575-%Pred-Post: 131 %
FEF2575-%Pred-Pre: 108 %
FEV1-%Change-Post: 7 %
FEV1-%Pred-Post: 92 %
FEV1-%Pred-Pre: 86 %
FEV1-Post: 4.07 L
FEV1-Pre: 3.8 L
FEV1FVC-%Change-Post: 0 %
FEV1FVC-%Pred-Pre: 106 %
FEV6-%Change-Post: 6 %
FEV6-%Pred-Post: 87 %
FEV6-%Pred-Pre: 81 %
FEV6-Post: 4.77 L
FEV6-Pre: 4.46 L
FEV6FVC-%Pred-Post: 102 %
FEV6FVC-%Pred-Pre: 102 %
FVC-%Change-Post: 7 %
FVC-%Pred-Post: 86 %
FVC-%Pred-Pre: 80 %
FVC-Post: 4.84 L
FVC-Pre: 4.49 L
Post FEV1/FVC ratio: 84 %
Post FEV6/FVC ratio: 100 %
Pre FEV1/FVC ratio: 85 %
Pre FEV6/FVC Ratio: 100 %
RV % pred: 91 %
RV: 1.81 L
TLC % pred: 84 %
TLC: 6.18 L

## 2021-06-26 MED ORDER — METOPROLOL TARTRATE 25 MG PO TABS: 37.5000 mg | ORAL_TABLET | Freq: Two times a day (BID) | ORAL | 3 refills | Status: DC

## 2021-06-26 MED ORDER — ALBUTEROL SULFATE (2.5 MG/3ML) 0.083% IN NEBU
2.5000 mg | INHALATION_SOLUTION | Freq: Once | RESPIRATORY_TRACT | Status: AC
  Administered 2021-06-26: 2.5 mg via RESPIRATORY_TRACT

## 2021-06-26 MED ORDER — ATORVASTATIN CALCIUM 80 MG PO TABS: 40.0000 mg | ORAL_TABLET | Freq: Every day | ORAL | 3 refills | Status: DC

## 2021-06-26 NOTE — Progress Notes (Signed)
Follow up visit Virtual visit  Subjective:   BINYOMIN BRANN, male    DOB: [birth year], 40 y.o.   MRN: [id number]   HPI  Chief Complaint  Patient presents with   Coronary artery disease involving native coronary artery of   Chest Pain   Hospitalization Follow-up    40 y.o. 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   Patient was seen in [facility] emergency department on 06/16/2021 with complaints of chest pain.  EKG showed no ischemia.  Chest x-ray showed no acute abnormalities.  High-sensitivity troponin was 87, negative.  Patient also underwent CT angiogram that showed no PE.  CT chest redemonstrated mediastinal and bilateral hilar lymphadenopathy with slight increase in size of subcarinal nodal conglomeration, but otherwise stable.  Superficial mediastinal and hilar lymph nodes when compared to 2020 scan.  Patient is going to have a biopsy of one of his lymph nodes by Dr. Roxan Hockey soon.  Has been working diligently with cardiac rehab.  Denies any chest pain or more than usual shortness of breath symptoms.  He had 1 episode of 6 beat nonsustained VT during cardiac rehab, not associated with any presyncope or syncope.  Recent lab results reviewed with the patient, details below.  Patient is doing well since discharge. He only has occasional right sided chest pain on breathing. He was walked at [place] for 20 min without chest pain. He is using CPAP at home regularly. O2 sats are still around 92-94%, with only occasional episodes of 89-90%. He is not using Dulera as recommended by pulmonology, as it has caused him side effects in the past.   On a separate note, he reports that he has burning and tingling sensation in his left wrist where he had ABG performed.    Current Outpatient Medications on File Prior to Visit   Medication Sig Dispense Refill   albuterol (VENTOLIN HFA) 108 (90 Base) MCG/ACT inhaler Inhale 1-2 puffs into the lungs every 6 (six) hours as needed for wheezing or shortness of breath.     amLODipine (NORVASC) 5 MG tablet Take 1 tablet (5 mg total) by mouth daily. 30 tablet 2   aspirin 81 MG EC tablet Take 1 tablet (81 mg total) by mouth daily. Swallow whole. 30 tablet 1   atorvastatin (LIPITOR) 40 MG tablet Take 1 tablet (40 mg total) by mouth daily. 30 tablet 1   cholecalciferol (VITAMIN D) 25 MCG (1000 UNIT) tablet Take 1,000 Units by mouth daily.     cyclobenzaprine (FLEXERIL) 10 MG tablet Take 10 mg by mouth 3 (three) times daily as needed for muscle spasms.     metoprolol tartrate (LOPRESSOR) 25 MG tablet Take 1 tablet (25 mg total) by mouth 2 (two) times daily. 60 tablet 1   nitroGLYCERIN (NITROSTAT) 0.4 MG SL tablet Place 1 tablet (0.4 mg total) under the tongue every 5 (five) minutes as needed for chest pain. 30 tablet 1   pantoprazole (PROTONIX) 40 MG tablet Take 40 mg by mouth daily.     predniSONE (DELTASONE) 10 MG tablet Take 2 tablets (20 mg total) by mouth daily with breakfast for 5 days, THEN 1 tablet (10 mg total) daily with breakfast for 5 days. 15 tablet 0   ticagrelor (BRILINTA) 90 MG TABS tablet Take 1 tablet (90 mg total) by mouth 2 (two) times daily. 60 tablet 1   No current  facility-administered medications on file prior to visit.    Cardiovascular & other pertient studies:  EKG 06/26/2021: Sinus rhythm 76 bpm Possible old anteroseptal infarct  CTA chest 06/16/2021: 1. No acute cardiopulmonary disease. Specifically, no evidence of pulmonary embolism. 2. 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 3. Borderline cardiomegaly, unchanged. 4. Minimal coronary artery calcifications. Aortic Atherosclerosis (ICD10-I70.0). 5. Suspected hepatic steatosis.  Correlation with LFTs is advised.    Echocardiogram 05/06/2021:  1. Left ventricular ejection fraction, by estimation, is 60 to 65%. The  left ventricle has normal function. The left ventricle has no regional  wall motion abnormalities. There is moderate asymmetric left ventricular  hypertrophy. Left ventricular  diastolic parameters were normal.   2. Right ventricular systolic function is normal. The right ventricular  size is normal.   3. No significant valvular abnormality.   4. The inferior vena cava is normal in size with greater than 50%  respiratory variability, suggesting right atrial pressure of 3 mmHg.   Coronary angiogram 05/05/2021: LM:  Very large. Normal LAD: Very large. 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 Normal RCA: Very large. Difficult to opacify distally, but probably normal.   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 No treatable lesion identified. Recommend DAPT with Aspirin and brilinta.   EKG 05/04/2021: Sinus tachycardia 114 bpm No acute ischemic changes     Recent labs: 06/16/2021: Glucose 129, BUN/Cr 11/1.37. EGFR >60. Na/K 136/3.7. Rest of the CMP normal Trop HS 8.7 H/H 16/50. MCV 86. Platelets 209 Chol 152, TG 130, HDL 36, LDL 93   05/07/2021: Glucose 128, BUN/Cr 20/1.49. EGFR 60. Na/K 137/3.9.  H/H 17/52. MCV 87. Platelets 178 HbA1C 6.6% Chol 158, TG 227, HDL 32, LDL 81 TSH 3.7 normal   Review of Systems  Cardiovascular:  Positive for dyspnea on exertion (Improved). Negative for chest pain (Right sided, occasional), leg swelling, palpitations and syncope.  Neurological:  Positive for paresthesias.       Today's Vitals   06/26/21 1034 06/26/21 1039  BP: (!) 148/99 (!) 135/97  Pulse: 78 66  Resp: 16   Temp: 98.7 F  (37.1 C)   SpO2: 94% 91%  Weight: (!) 326 lb (147.9 kg)   Height: 6' (1.829 m)    Body mass index is 44.21 kg/m.     Body mass index is 44.21 kg/m. Filed Weights   06/26/21 1034  Weight: (!) 326 lb (147.9 kg)    Objective:   Physical Exam Vitals and nursing note reviewed.  Constitutional:      General: He is not in acute distress.    Appearance: He is well-developed. He is obese.  Pulmonary:     Effort: Pulmonary effort is normal.  Musculoskeletal:     Right lower leg: No edema.     Left lower leg: No edema.  Neurological:     Mental Status: He is alert and oriented to person, place, and time.          Assessment & Recommendations:   40 y.o. 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   NSTEMI: Nonobstructive CAD with aneurysmal coronaries  Recommend DAPT for 1 year, statin, metoprolol. Given that he did not have any stent placement, it would be reasonable to hold Brilinta 5 days before planned biopsy.  If aspirin can be continued perioperatively, it would be preferable.  However, aspirin has been held, recommend minimizing the interruption perioperatively.  NSVT: Increase metoprolol tartrate to 37.5 mg twice daily.  Mixed hyperlipidemia: LDL remains elevated at 93.  Increase Lipitor to 80 mg daily.  Hilar and mediastinal adenopathy, hypoxia: Noncardiac in etiology. Continue pulmonary follow up. Low cardiac risk for upcoming biopsy procedure.   H/o DVT: In 2020 treated with ? 6 months of anticoagulation He has 3 family members on maternal side with with h/o lupus anticoagulant May need outpatient hematology consult. Defer to PCP.  Lipid panel and f/u in 3 months    Nigel Mormon, MD Pager: [phone] Office: [phone]

## 2021-06-27 ENCOUNTER — Other Ambulatory Visit: Payer: Self-pay

## 2021-06-27 ENCOUNTER — Encounter (HOSPITAL_COMMUNITY)
Admission: RE | Admit: 2021-06-27 | Discharge: 2021-06-27 | Disposition: A | Discharge status: Home / Self Care | Payer: Managed Care, Other (non HMO) | Source: Ambulatory Visit | Attending: Cardiology | Admitting: Cardiology

## 2021-06-27 DIAGNOSIS — I214 Non-ST elevation (NSTEMI) myocardial infarction: Secondary | ICD-10-CM

## 2021-06-27 DIAGNOSIS — Z5189 Encounter for other specified aftercare: Secondary | ICD-10-CM | POA: Diagnosis not present

## 2021-06-27 NOTE — Progress Notes (Signed)
Daily Session Note  Patient Details  Name: Zachary Weeks MRN: [id number] Date of Birth: 05-11-1981 Referring Provider:   Flowsheet Row CARDIAC REHAB PHASE II ORIENTATION from 05/30/2021 in [place]  Referring Provider Dr. Grandville Silos       Encounter Date: 06/27/2021  Check In:  Session Check In - 06/27/21 0815       Check-In   Supervising physician immediately available to respond to emergencies [facility] MD immediately available    Physician(s) Dr. Harl Bowie    Location [facility]    Staff Present Hoy Register, MS, ACSM-CEP, Exercise Physiologist;Debra Wynetta Emery, RN, BSN    Virtual Visit No    Medication changes reported     Yes    Comments Metoprolol increased to 37.5 mg bid and lipitor increased to 80 mg daily    Fall or balance concerns reported    No    Tobacco Cessation No Change    Warm-up and Cool-down Performed as group-led instruction    Resistance Training Performed Yes    VAD Patient? No    PAD/SET Patient? No      Pain Assessment   Currently in Pain? No/denies    Pain Score 0-No pain    Multiple Pain Sites No             Capillary Blood Glucose: No results found for this or any previous visit (from the past 24 hour(s)).    Social History   Tobacco Use  Smoking Status Former   Packs/day: 1.00   Years: 3.00   Pack years: 3.00   Types: Cigarettes   Quit date: 1999   Years since quitting: 23.6  Smokeless Tobacco Current   Types: Chew, Snuff    Goals Met:  Independence with exercise equipment Exercise tolerated well No report of cardiac concerns or symptoms Strength training completed today  Goals Unmet:  Not Applicable  Comments: checkout time is 0915   Dr. Kathie Dike is Medical Director for [facility] Pulmonary Rehab.

## 2021-06-30 ENCOUNTER — Other Ambulatory Visit: Payer: Self-pay | Admitting: *Deleted

## 2021-06-30 ENCOUNTER — Encounter (HOSPITAL_COMMUNITY)
Admission: RE | Admit: 2021-06-30 | Discharge: 2021-06-30 | Disposition: A | Discharge status: Home / Self Care | Payer: Managed Care, Other (non HMO) | Source: Ambulatory Visit | Attending: Cardiology | Admitting: Cardiology

## 2021-06-30 ENCOUNTER — Other Ambulatory Visit: Payer: Self-pay

## 2021-06-30 ENCOUNTER — Encounter: Payer: Self-pay | Admitting: *Deleted

## 2021-06-30 ENCOUNTER — Institutional Professional Consult (permissible substitution): Payer: Managed Care, Other (non HMO) | Admitting: Thoracic Surgery (Cardiothoracic Vascular Surgery)

## 2021-06-30 ENCOUNTER — Encounter: Payer: Self-pay | Admitting: Thoracic Surgery (Cardiothoracic Vascular Surgery)

## 2021-06-30 VITALS — BP 111/77 | HR 98 | Resp 20 | Ht 72.0 in | Wt 325.0 lb

## 2021-06-30 DIAGNOSIS — I214 Non-ST elevation (NSTEMI) myocardial infarction: Secondary | ICD-10-CM

## 2021-06-30 DIAGNOSIS — R590 Localized enlarged lymph nodes: Secondary | ICD-10-CM

## 2021-06-30 DIAGNOSIS — Z5189 Encounter for other specified aftercare: Secondary | ICD-10-CM | POA: Diagnosis not present

## 2021-06-30 NOTE — Progress Notes (Signed)
Daily Session Note  Patient Details  Name: Zachary Weeks MRN: [id number] Date of Birth: 02/17/1981 Referring Provider:   Flowsheet Row CARDIAC REHAB PHASE II ORIENTATION from 05/30/2021 in [place]  Referring Provider Dr. Grandville Silos       Encounter Date: 06/30/2021  Check In:  Session Check In - 06/30/21 0815       Check-In   Supervising physician immediately available to respond to emergencies [facility] MD immediately available    Physician(s) Dr. Harl Bowie    Location [facility]    Staff Present Hoy Register, MS, ACSM-CEP, Exercise Physiologist;Debra Wynetta Emery, RN, BSN    Virtual Visit No    Medication changes reported     No    Fall or balance concerns reported    No    Tobacco Cessation No Change    Warm-up and Cool-down Performed as group-led instruction    Resistance Training Performed Yes    VAD Patient? No    PAD/SET Patient? No      Pain Assessment   Currently in Pain? No/denies    Pain Score 0-No pain    Multiple Pain Sites No             Capillary Blood Glucose: No results found for this or any previous visit (from the past 24 hour(s)).    Social History   Tobacco Use  Smoking Status Former   Packs/day: 1.00   Years: 3.00   Pack years: 3.00   Types: Cigarettes   Quit date: 1999   Years since quitting: 23.6  Smokeless Tobacco Current   Types: Chew, Snuff    Goals Met:  Independence with exercise equipment Exercise tolerated well No report of cardiac concerns or symptoms Strength training completed today  Goals Unmet:  Not Applicable  Comments: checkout time is 0915   Dr. Kathie Dike is Medical Director for [facility] Pulmonary Rehab.

## 2021-06-30 NOTE — Progress Notes (Signed)
PCP is Caryl Bis, MD Referring Provider is Rigoberto Noel, MD  Chief Complaint  Patient presents with   mediastinal lymphadenopathy     Initial surgical consult, CTA chest 6/13, ECHO 6/14, PFT 8/4    HPI: Zachary Weeks is sent for consultation regarding mediastinal lymphadenopathy.  Zachary Weeks is a 39 year old man with a complicated medical history.  He has a past medical history significant for DVT, reflux, asthma, hypertension, left brachial plexopathy, sleep apnea, non-ST elevation MI, and mediastinal adenopathy.  He was diagnosed with mediastinal adenopathy back in 2020 around the time he had a deep venous thrombosis.  He had originally presented with left arm weakness and pain.  As far of his Weeks-up he had a CT of the chest which showed mediastinal and hilar adenopathy.  He had an EBUS which showed lymphoid cells but was nondiagnostic.  He was followed and the notes did not change significantly over time initially.  In June he noticed that his heart was racing and his oxygen saturations were low he went to the hospital.  He ruled in for non-ST elevation MI.  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  Also during that admission he had a CT of the chest which showed lymph nodes had enlarged slightly compared to December 2020.  He was treated with aspirin and Brilinta.  In July he had another CT which showed persistent adenopathy.  He continues to have shortness of breath both at rest and with exertion.  He does have sleep apnea and uses CPAP at night.  He is not having any chest pain, pressure, or tightness.  However he did not have those symptoms when he had his non-STEMI, he has lost about 21 pounds over the past 3 months but says that has been intentional.  Zachary Weeks, Zachary Weeks, Zachary Weeks '[]'$     2    Weeks and capable of self care, unable to do Weeks activities, up and about >50 % of waking hours                              '[]'$     3    Only limited self care, in bed greater than 50% of waking hours '[]'$     4    Completely disabled, Zachary self care, confined to bed or chair '[]'$     5    Moribund  Past Medical History:  Diagnosis Date   Asthma    DVT (deep venous thrombosis) (HCC)    Dyspnea    at times - Zachary known reason.    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   Pneumonia    33- 86 years of age    Past Surgical History:  Procedure Laterality Date   BRONCHIAL NEEDLE ASPIRATION BIOPSY  11/01/2020   Procedure: BRONCHIAL NEEDLE ASPIRATION BIOPSIES;  Surgeon: Rigoberto Noel, MD;  Location: [facility] ENDOSCOPY;  Service: Cardiopulmonary;;   BRONCHIAL WASHINGS  11/01/2020   Procedure: BRONCHIAL WASHINGS;  Surgeon: Rigoberto Noel, MD;  Location: [facility] ENDOSCOPY;  Service: Cardiopulmonary;;  INTRAVASCULAR ULTRASOUND/IVUS N/A 05/05/2021   Procedure: Intravascular Ultrasound/IVUS;  Surgeon: Nigel Mormon, MD;  Location: [facility];  Service: Cardiovascular;  Laterality: N/A;   LEFT HEART CATH AND CORONARY ANGIOGRAPHY N/A 05/05/2021   Procedure: LEFT HEART CATH AND CORONARY ANGIOGRAPHY;  Surgeon: Nigel Mormon, MD;  Location: [facility];  Service: Cardiovascular;  Laterality: N/A;   Zachary PAST SURGERIES     VIDEO BRONCHOSCOPY N/A 11/01/2020   Procedure: VIDEO BRONCHOSCOPY WITH ENDOBRONCHIAL ULTRASOUND;  Surgeon: Rigoberto Noel, MD;  Location: [facility];  Service: Cardiopulmonary;  Laterality: N/A;    Family History  Problem Relation Age of Onset   Pulmonary fibrosis Mother    Cancer Mother    Cancer Father        prostate   Hypertension Father    Fibromyalgia Maternal Aunt     Lupus Maternal Grandmother    Other Maternal Grandmother        clotting disorder   Other Other        clotting disorder    Social History Social History   Tobacco Use   Smoking status: Former    Packs/day: 1.00    Years: 3.00    Pack years: 3.00    Types: Cigarettes    Quit date: 1999    Years since quitting: 23.6   Smokeless tobacco: Current    Types: Chew, Snuff  Vaping Use   Vaping Use: Never used  Substance Use Topics   Alcohol use: Yes    Comment: rarely- maybe 2 mixed drinks in a year   Drug use: Never    Current Outpatient Medications  Medication Sig Dispense Refill   albuterol (VENTOLIN HFA) 108 (90 Base) MCG/ACT inhaler Inhale 1-2 puffs into the lungs every 6 (six) hours as needed for wheezing or shortness of breath.     amLODipine (NORVASC) 5 MG tablet Take 1 tablet (5 mg total) by mouth daily. 30 tablet 2   aspirin 81 MG EC tablet Take 1 tablet (81 mg total) by mouth daily. Swallow whole. 30 tablet 1   atorvastatin (LIPITOR) 80 MG tablet Take 0.5 tablets (40 mg total) by mouth daily. 90 tablet 3   cholecalciferol (VITAMIN D) 25 MCG (1000 UNIT) tablet Take 1,000 Units by mouth daily.     cyclobenzaprine (FLEXERIL) 10 MG tablet Take 10 mg by mouth 3 (three) times daily as needed for muscle spasms.     metoprolol tartrate (LOPRESSOR) 25 MG tablet Take 1.5 tablets (37.5 mg total) by mouth 2 (two) times daily. 180 tablet 3   nitroGLYCERIN (NITROSTAT) 0.4 MG SL tablet Place 1 tablet (0.4 mg total) under the tongue every 5 (five) minutes as needed for chest pain. 30 tablet 1   pantoprazole (PROTONIX) 40 MG tablet Take 40 mg by mouth daily.     ticagrelor (BRILINTA) 90 MG TABS tablet Take 1 tablet (90 mg total) by mouth 2 (two) times daily. 60 tablet 1   Zachary current facility-administered medications for this visit.    Zachary Known Allergies  Review of Systems  Constitutional:  Positive for Weeks change. Negative for appetite change and unexpected weight change (Has  lost 20 pounds in 3 months intentionally).  HENT:  Negative for voice change.   Respiratory:  Positive for apnea, cough, shortness of breath and wheezing.   Cardiovascular:  Negative for chest pain and leg swelling.  Gastrointestinal:  Negative for abdominal distention and abdominal pain.  Genitourinary:  Positive for frequency. Negative for difficulty  urinating.  Musculoskeletal:  Negative for arthralgias and back pain.  Neurological:  Positive for weakness (Left arm weakness in past, none currently).  Hematological:  Negative for adenopathy. Does not bruise/bleed easily.  All other systems reviewed and are negative.  BP 111/77 (BP Location: Left Arm, Patient Position: Sitting)   Pulse 98   Resp 20   Ht 6' (1.829 m)   Wt (!) 325 lb (147.4 kg)   SpO2 90% Comment: RA  BMI 44.08 kg/m  Physical Exam Vitals reviewed.  Constitutional:      General: He is not in acute distress.    Appearance: He is obese.  HENT:     Head: Normocephalic and atraumatic.  Eyes:     General: Zachary scleral icterus.    Extraocular Movements: Extraocular movements intact.  Neck:     Vascular: Zachary carotid bruit.  Cardiovascular:     Rate and Rhythm: Normal rate and regular rhythm.     Heart sounds: Normal heart sounds. Zachary murmur heard. Pulmonary:     Effort: Pulmonary effort is normal. Zachary respiratory distress.     Breath sounds: Zachary wheezing or rales.  Abdominal:     General: There is Zachary distension.     Palpations: Abdomen is soft.  Musculoskeletal:     Cervical back: Neck supple.  Lymphadenopathy:     Cervical: Zachary cervical adenopathy.  Skin:    General: Skin is warm and dry.  Neurological:     General: Zachary focal deficit present.     Mental Status: He is alert and oriented to person, place, and time.     Cranial Nerves: Zachary cranial nerve deficit.     Motor: Zachary weakness.    Diagnostic Tests: CT ANGIOGRAPHY CHEST WITH CONTRAST   TECHNIQUE: Multidetector CT imaging of the chest was performed using  the standard protocol during bolus administration of intravenous contrast. Multiplanar CT image reconstructions and MIPs were obtained to evaluate the vascular anatomy.   CONTRAST:  163m OMNIPAQUE IOHEXOL 350 MG/ML SOLN   COMPARISON:  Chest CT-05/05/2021; 08/23/2020; 11/13/2019   PET-CT 04/24/2020 (outside examination)   Bilateral lower extremity venous Doppler ultrasound-10/31/2019 (positive for age-indeterminate near occlusive DVT involving both paired right posterior tibial veins).   FINDINGS: Vascular Findings:   There is adequate opacification of the pulmonary arterial system with the main pulmonary artery measuring 320 Hounsfield units. There are Zachary discrete filling defects within the pulmonary arterial tree to suggest pulmonary embolism. Normal caliber of the main pulmonary artery.   Borderline cardiomegaly. Zachary pericardial effusion. Minimal coronary artery calcifications.   Zachary evidence of thoracic aortic aneurysm or dissection on this nongated examination. Bovine configuration of the aortic arch. The branch vessels of the aortic arch appear widely patent throughout their imaged courses.   Review of the MIP images confirms the above findings.     ----------------------------------------------------------------------------------   Nonvascular Findings:   Mediastinum/Lymph Nodes: Redemonstrated mediastinal and bilateral hilar lymphadenopathy, with slight increase in size of index subcarinal nodal conglomeration now measuring 2.4 cm in diameter (image 43, series 4), previously, 1.9 cm when compared to the 10/2019 examination.   Remaining mediastinal and hilar lymph nodes appear grossly unchanged compared to the 10/2019 examination with index AP window lymph node measuring 1.5 cm in greatest short axis diameter (image 33, series 4), index left hilar lymph node measuring 1.3 cm (image 43), index right suprahilar lymph node measuring 1.7 cm (image 41) and  index right-sided paratracheal lymph node measuring 1.3 cm (image 31).   Lungs/Pleura:  Minimal right lower lobe predominant subpleural ground-glass atelectasis. Grossly unchanged subpleural pleuroparenchymal thickening involving the medial aspect the right middle lobe as well as the caudal aspect of the lingula, similar to the 10/2019 examination. Zachary discrete focal airspace opacities. Zachary pleural effusion or pneumothorax. The central pulmonary airways appear widely patent.   Zachary discrete pulmonary nodules.   Upper abdomen: Limited early arterial phase evaluation of the upper abdomen demonstrates diffuse decreased attenuation hepatic parenchyma suggestive of hepatic steatosis. Incidental note is made of a small splenule.   Musculoskeletal: Zachary acute or aggressive osseous abnormalities. Stable mild (approximately 25%) compression deformity involving the T9 vertebral body. Stigmata of dish within the caudal aspect of the thoracic spine. Mild symmetric bilateral gynecomastia. Normal appearance of the imaged portions of the thyroid gland.   IMPRESSION: 1. Zachary acute cardiopulmonary disease. Specifically, Zachary evidence of pulmonary embolism. 2. 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 3. Borderline cardiomegaly, unchanged. 4. Minimal coronary artery calcifications. Aortic Atherosclerosis (ICD10-I70.0). 5. Suspected hepatic steatosis.  Correlation with LFTs is advised.     Electronically Signed   By: Sandi Mariscal M.D.   On: 06/16/2021 10:36 I personally reviewed the CT images.  There is mediastinal and hilar adenopathy.  Little, if any, change from June 2022 but definite increase in size from December  2020.  Impression: Zachary Weeks is a 40 year old man with a past medical history significant for DVT, reflux, asthma, hypertension, left brachial plexopathy, sleep apnea, non-ST elevation MI, and mediastinal adenopathy.   Non-ST elevation MI in June.  Zachary significant coronary atherosclerosis.  He is on aspirin and Brilinta.  He says that Dr. Virgina Jock told him that he could stop his Brilinta at any time if necessary.  We will need to stop that for a week prior to surgery.  I will check with him to be sure that is okay.  History DVT-not on anticoagulation currently.  Higher risk for perioperative DVT given history.  Will use SCD in OR.  Mediastinal and bilateral hilar adenopathy-differential diagnosis includes AFB and fungal infections, sarcoidosis, and malignancy.  This has been a relatively indolent process.  Sarcoidosis is probably most likely but cannot rule out other possibilities without a biopsy.  I discussed the proposed surgical procedure with Zachary Weeks.  It would be a mediastinoscopy.  I informed him of the need for general anesthesia, the incisions to be used, the plan for outpatient or 24-hour observation, and the time for recovery and return to Weeks.  I informed him of the indications, risks, benefits, and alternatives.  He understands the risks include, but are not limited to death, MI, DVT, PE, bleeding, possible need for transfusion, infection, pneumothorax, esophageal injury, stroke, recurrent nerve injury with hoarseness, as well as the possibility of other unforeseeable complications.  He understands accepts the risk and agrees to proceed.  Plan: Hold Brilinta after dose on 07/03/2021 if okay with Dr. Virgina Jock Mediastinoscopy on Friday, 07/11/2021 Will decide day of procedure, but may need 24-hour observation given his complicated medical history and recent non-STEMI.  Melrose Nakayama, MD [facility] [phone]

## 2021-06-30 NOTE — H&P (View-Only) (Signed)
PCP is Caryl Bis, MD Referring Provider is Rigoberto Noel, MD  Chief Complaint  Patient presents with   mediastinal lymphadenopathy     Initial surgical consult, CTA chest 6/13, ECHO 6/14, PFT 8/4    HPI: Zachary Weeks is sent for consultation regarding mediastinal lymphadenopathy.  Zachary Weeks is a 40 year old man with a complicated medical history.  Zachary Weeks has a past medical history significant for DVT, reflux, asthma, hypertension, left brachial plexopathy, sleep apnea, non-ST elevation MI, and mediastinal adenopathy.  Zachary Weeks was diagnosed with mediastinal adenopathy back in 2020 around the time Zachary Weeks had a deep venous thrombosis.  Zachary Weeks had originally presented with left arm weakness and pain.  As far of his work-up Zachary Weeks had a CT of the chest which showed mediastinal and hilar adenopathy.  Zachary Weeks had an EBUS which showed lymphoid cells but was nondiagnostic.  Zachary Weeks was followed and the notes did not change significantly over time initially.  In June Zachary Weeks noticed that his heart was racing and his oxygen saturations were low Zachary Weeks went to the hospital.  Zachary Weeks ruled in for non-ST elevation MI.  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  Also during that admission Zachary Weeks had a CT of the chest which showed lymph nodes had enlarged slightly compared to December 2020.  Zachary Weeks was treated with aspirin and Brilinta.  In July Zachary Weeks had another CT which showed persistent adenopathy.  Zachary Weeks continues to have shortness of breath both at rest and with exertion.  Zachary Weeks does have sleep apnea and uses CPAP at night.  Zachary Weeks is not having any chest pain, pressure, or tightness.  However Zachary Weeks did not have those symptoms when Zachary Weeks had his non-STEMI, Zachary Weeks has lost about 21 pounds over the past 3 months but says that has been intentional.  Zubrod Score: At the time of surgery this patient's most appropriate activity status/level should be described as: '[]'$     0    Normal activity, no symptoms '[x]'$      1    Restricted in physical strenuous activity but ambulatory, able to do out light work '[]'$     2    Ambulatory and capable of self care, unable to do work activities, up and about >50 % of waking hours                              '[]'$     3    Only limited self care, in bed greater than 50% of waking hours '[]'$     4    Completely disabled, no self care, confined to bed or chair '[]'$     5    Moribund  Past Medical History:  Diagnosis Date   Asthma    DVT (deep venous thrombosis) (HCC)    Dyspnea    at times - no known reason.    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   Pneumonia    9- 30 years of age    Past Surgical History:  Procedure Laterality Date   BRONCHIAL NEEDLE ASPIRATION BIOPSY  11/01/2020   Procedure: BRONCHIAL NEEDLE ASPIRATION BIOPSIES;  Surgeon: Rigoberto Noel, MD;  Location: [facility] ENDOSCOPY;  Service: Cardiopulmonary;;   BRONCHIAL WASHINGS  11/01/2020   Procedure: BRONCHIAL WASHINGS;  Surgeon: Rigoberto Noel, MD;  Location: [facility] ENDOSCOPY;  Service: Cardiopulmonary;;  INTRAVASCULAR ULTRASOUND/IVUS N/A 05/05/2021   Procedure: Intravascular Ultrasound/IVUS;  Surgeon: Nigel Mormon, MD;  Location: [place] CV LAB;  Service: Cardiovascular;  Laterality: N/A;   LEFT HEART CATH AND CORONARY ANGIOGRAPHY N/A 05/05/2021   Procedure: LEFT HEART CATH AND CORONARY ANGIOGRAPHY;  Surgeon: Nigel Mormon, MD;  Location: [place] [facility] LAB;  Service: Cardiovascular;  Laterality: N/A;   NO PAST SURGERIES     VIDEO BRONCHOSCOPY N/A 11/01/2020   Procedure: VIDEO BRONCHOSCOPY WITH ENDOBRONCHIAL ULTRASOUND;  Surgeon: Rigoberto Noel, MD;  Location: [place];  Service: Cardiopulmonary;  Laterality: N/A;    Family History  Problem Relation Age of Onset   Pulmonary fibrosis Mother    Cancer Mother    Cancer Father        prostate   Hypertension Father    Fibromyalgia Maternal Aunt     Lupus Maternal Grandmother    Other Maternal Grandmother        clotting disorder   Other Other        clotting disorder    Social History Social History   Tobacco Use   Smoking status: Former    Packs/day: 1.00    Years: 3.00    Pack years: 3.00    Types: Cigarettes    Quit date: 1999    Years since quitting: 23.6   Smokeless tobacco: Current    Types: Chew, Snuff  Vaping Use   Vaping Use: Never used  Substance Use Topics   Alcohol use: Yes    Comment: rarely- maybe 2 mixed drinks in a year   Drug use: Never    Current Outpatient Medications  Medication Sig Dispense Refill   albuterol (VENTOLIN HFA) 108 (90 Base) MCG/ACT inhaler Inhale 1-2 puffs into the lungs every 6 (six) hours as needed for wheezing or shortness of breath.     amLODipine (NORVASC) 5 MG tablet Take 1 tablet (5 mg total) by mouth daily. 30 tablet 2   aspirin 81 MG EC tablet Take 1 tablet (81 mg total) by mouth daily. Swallow whole. 30 tablet 1   atorvastatin (LIPITOR) 80 MG tablet Take 0.5 tablets (40 mg total) by mouth daily. 90 tablet 3   cholecalciferol (VITAMIN D) 25 MCG (1000 UNIT) tablet Take 1,000 Units by mouth daily.     cyclobenzaprine (FLEXERIL) 10 MG tablet Take 10 mg by mouth 3 (three) times daily as needed for muscle spasms.     metoprolol tartrate (LOPRESSOR) 25 MG tablet Take 1.5 tablets (37.5 mg total) by mouth 2 (two) times daily. 180 tablet 3   nitroGLYCERIN (NITROSTAT) 0.4 MG SL tablet Place 1 tablet (0.4 mg total) under the tongue every 5 (five) minutes as needed for chest pain. 30 tablet 1   pantoprazole (PROTONIX) 40 MG tablet Take 40 mg by mouth daily.     ticagrelor (BRILINTA) 90 MG TABS tablet Take 1 tablet (90 mg total) by mouth 2 (two) times daily. 60 tablet 1   No current facility-administered medications for this visit.    No Known Allergies  Review of Systems  Constitutional:  Positive for activity change. Negative for appetite change and unexpected weight change (Has  lost 20 pounds in 3 months intentionally).  HENT:  Negative for voice change.   Respiratory:  Positive for apnea, cough, shortness of breath and wheezing.   Cardiovascular:  Negative for chest pain and leg swelling.  Gastrointestinal:  Negative for abdominal distention and abdominal pain.  Genitourinary:  Positive for frequency. Negative for difficulty  urinating.  Musculoskeletal:  Negative for arthralgias and back pain.  Neurological:  Positive for weakness (Left arm weakness in past, none currently).  Hematological:  Negative for adenopathy. Does not bruise/bleed easily.  All other systems reviewed and are negative.  BP 111/77 (BP Location: Left Arm, Patient Position: Sitting)   Pulse 98   Resp 20   Ht 6' (1.829 m)   Wt (!) 325 lb (147.4 kg)   SpO2 90% Comment: RA  BMI 44.08 kg/m  Physical Exam Vitals reviewed.  Constitutional:      General: Zachary Weeks is not in acute distress.    Appearance: Zachary Weeks is obese.  HENT:     Head: Normocephalic and atraumatic.  Eyes:     General: No scleral icterus.    Extraocular Movements: Extraocular movements intact.  Neck:     Vascular: No carotid bruit.  Cardiovascular:     Rate and Rhythm: Normal rate and regular rhythm.     Heart sounds: Normal heart sounds. No murmur heard. Pulmonary:     Effort: Pulmonary effort is normal. No respiratory distress.     Breath sounds: No wheezing or rales.  Abdominal:     General: There is no distension.     Palpations: Abdomen is soft.  Musculoskeletal:     Cervical back: Neck supple.  Lymphadenopathy:     Cervical: No cervical adenopathy.  Skin:    General: Skin is warm and dry.  Neurological:     General: No focal deficit present.     Mental Status: Zachary Weeks is alert and oriented to person, place, and time.     Cranial Nerves: No cranial nerve deficit.     Motor: No weakness.    Diagnostic Tests: CT ANGIOGRAPHY CHEST WITH CONTRAST   TECHNIQUE: Multidetector CT imaging of the chest was performed using  the standard protocol during bolus administration of intravenous contrast. Multiplanar CT image reconstructions and MIPs were obtained to evaluate the vascular anatomy.   CONTRAST:  166m OMNIPAQUE IOHEXOL 350 MG/ML SOLN   COMPARISON:  Chest CT-05/05/2021; 08/23/2020; 11/13/2019   PET-CT 04/24/2020 (outside examination)   Bilateral lower extremity venous Doppler ultrasound-10/31/2019 (positive for age-indeterminate near occlusive DVT involving both paired right posterior tibial veins).   FINDINGS: Vascular Findings:   There is adequate opacification of the pulmonary arterial system with the main pulmonary artery measuring 320 Hounsfield units. There are no discrete filling defects within the pulmonary arterial tree to suggest pulmonary embolism. Normal caliber of the main pulmonary artery.   Borderline cardiomegaly. No pericardial effusion. Minimal coronary artery calcifications.   No evidence of thoracic aortic aneurysm or dissection on this nongated examination. Bovine configuration of the aortic arch. The branch vessels of the aortic arch appear widely patent throughout their imaged courses.   Review of the MIP images confirms the above findings.     ----------------------------------------------------------------------------------   Nonvascular Findings:   Mediastinum/Lymph Nodes: Redemonstrated mediastinal and bilateral hilar lymphadenopathy, with slight increase in size of index subcarinal nodal conglomeration now measuring 2.4 cm in diameter (image 43, series 4), previously, 1.9 cm when compared to the 10/2019 examination.   Remaining mediastinal and hilar lymph nodes appear grossly unchanged compared to the 10/2019 examination with index AP window lymph node measuring 1.5 cm in greatest short axis diameter (image 33, series 4), index left hilar lymph node measuring 1.3 cm (image 43), index right suprahilar lymph node measuring 1.7 cm (image 41) and  index right-sided paratracheal lymph node measuring 1.3 cm (image 31).   Lungs/Pleura:  Minimal right lower lobe predominant subpleural ground-glass atelectasis. Grossly unchanged subpleural pleuroparenchymal thickening involving the medial aspect the right middle lobe as well as the caudal aspect of the lingula, similar to the 10/2019 examination. No discrete focal airspace opacities. No pleural effusion or pneumothorax. The central pulmonary airways appear widely patent.   No discrete pulmonary nodules.   Upper abdomen: Limited early arterial phase evaluation of the upper abdomen demonstrates diffuse decreased attenuation hepatic parenchyma suggestive of hepatic steatosis. Incidental note is made of a small splenule.   Musculoskeletal: No acute or aggressive osseous abnormalities. Stable mild (approximately 25%) compression deformity involving the T9 vertebral body. Stigmata of dish within the caudal aspect of the thoracic spine. Mild symmetric bilateral gynecomastia. Normal appearance of the imaged portions of the thyroid gland.   IMPRESSION: 1. No acute cardiopulmonary disease. Specifically, no evidence of pulmonary embolism. 2. 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 3. Borderline cardiomegaly, unchanged. 4. Minimal coronary artery calcifications. Aortic Atherosclerosis (ICD10-I70.0). 5. Suspected hepatic steatosis.  Correlation with LFTs is advised.     Electronically Signed   By: Sandi Mariscal M.D.   On: 06/16/2021 10:36 I personally reviewed the CT images.  There is mediastinal and hilar adenopathy.  Little, if any, change from June 2022 but definite increase in size from December  2020.  Impression: Zachary Weeks is a 40 year old man with a past medical history significant for DVT, reflux, asthma, hypertension, left brachial plexopathy, sleep apnea, non-ST elevation MI, and mediastinal adenopathy.   Non-ST elevation MI in June.  No significant coronary atherosclerosis.  Zachary Weeks is on aspirin and Brilinta.  Zachary Weeks says that Dr. Virgina Jock told him that Zachary Weeks could stop his Brilinta at any time if necessary.  We will need to stop that for a week prior to surgery.  I will check with him to be sure that is okay.  History DVT-not on anticoagulation currently.  Higher risk for perioperative DVT given history.  Will use SCD in OR.  Mediastinal and bilateral hilar adenopathy-differential diagnosis includes AFB and fungal infections, sarcoidosis, and malignancy.  This has been a relatively indolent process.  Sarcoidosis is probably most likely but cannot rule out other possibilities without a biopsy.  I discussed the proposed surgical procedure with Mr. Quigley.  It would be a mediastinoscopy.  I informed him of the need for general anesthesia, the incisions to be used, the plan for outpatient or 24-hour observation, and the time for recovery and return to work.  I informed him of the indications, risks, benefits, and alternatives.  Zachary Weeks understands the risks include, but are not limited to death, MI, DVT, PE, bleeding, possible need for transfusion, infection, pneumothorax, esophageal injury, stroke, recurrent nerve injury with hoarseness, as well as the possibility of other unforeseeable complications.  Zachary Weeks understands accepts the risk and agrees to proceed.  Plan: Hold Brilinta after dose on 07/03/2021 if okay with Dr. Virgina Jock Mediastinoscopy on Friday, 07/11/2021 Will decide day of procedure, but may need 24-hour observation given his complicated medical history and recent non-STEMI.  Melrose Nakayama, MD [facility] [phone]

## 2021-07-02 ENCOUNTER — Other Ambulatory Visit: Payer: Self-pay

## 2021-07-02 ENCOUNTER — Encounter (HOSPITAL_COMMUNITY)
Admission: RE | Admit: 2021-07-02 | Discharge: 2021-07-02 | Disposition: A | Discharge status: Home / Self Care | Payer: Managed Care, Other (non HMO) | Source: Ambulatory Visit | Attending: Cardiology | Admitting: Cardiology

## 2021-07-02 DIAGNOSIS — I214 Non-ST elevation (NSTEMI) myocardial infarction: Secondary | ICD-10-CM

## 2021-07-02 DIAGNOSIS — Z5189 Encounter for other specified aftercare: Secondary | ICD-10-CM | POA: Diagnosis not present

## 2021-07-02 NOTE — Progress Notes (Signed)
Daily Session Note  Patient Details  Name: MAKHI MUZQUIZ MRN: [id number] Date of Birth: 1981-02-10 Referring Provider:   Flowsheet Row CARDIAC REHAB PHASE II ORIENTATION from 05/30/2021 in [place]  Referring Provider Dr. Grandville Silos       Encounter Date: 07/02/2021  Check In:  Session Check In - 07/02/21 0815       Check-In   Supervising physician immediately available to respond to emergencies [facility] MD immediately available    Physician(s) Dr. Harrington Challenger    Location [facility]    Staff Present Hoy Register, MS, ACSM-CEP, Exercise Physiologist;Other    Virtual Visit No    Medication changes reported     No    Fall or balance concerns reported    No    Tobacco Cessation No Change    Warm-up and Cool-down Performed as group-led instruction    Resistance Training Performed Yes    VAD Patient? No    PAD/SET Patient? No      Pain Assessment   Currently in Pain? No/denies    Pain Score 0-No pain    Multiple Pain Sites No             Capillary Blood Glucose: No results found for this or any previous visit (from the past 24 hour(s)).    Social History   Tobacco Use  Smoking Status Former   Packs/day: 1.00   Years: 3.00   Pack years: 3.00   Types: Cigarettes   Quit date: 1999   Years since quitting: 23.6  Smokeless Tobacco Current   Types: Chew, Snuff    Goals Met:  Independence with exercise equipment Exercise tolerated well No report of cardiac concerns or symptoms Strength training completed today  Goals Unmet:  Not Applicable  Comments: checkout time is 0915   Dr. Kathie Dike is Medical Director for [facility] Pulmonary Re[facility].

## 2021-07-03 ENCOUNTER — Other Ambulatory Visit: Payer: Self-pay | Admitting: Cardiology

## 2021-07-04 ENCOUNTER — Encounter (HOSPITAL_COMMUNITY)
Admission: RE | Admit: 2021-07-04 | Discharge: 2021-07-04 | Disposition: A | Discharge status: Home / Self Care | Payer: Managed Care, Other (non HMO) | Source: Ambulatory Visit | Attending: Cardiology | Admitting: Cardiology

## 2021-07-04 ENCOUNTER — Other Ambulatory Visit: Payer: Self-pay

## 2021-07-04 DIAGNOSIS — I214 Non-ST elevation (NSTEMI) myocardial infarction: Secondary | ICD-10-CM

## 2021-07-04 DIAGNOSIS — Z5189 Encounter for other specified aftercare: Secondary | ICD-10-CM | POA: Diagnosis not present

## 2021-07-04 NOTE — Progress Notes (Signed)
Daily Session Note  Patient Details  Name: Zachary Weeks MRN: [id number] Date of Birth: [birth year] Referring Provider:   Flowsheet Row CARDIAC REHAB PHASE II ORIENTATION from 05/30/2021 in [place]  Referring Provider Dr. Grandville Silos       Encounter Date: 07/04/2021  Check In:  Session Check In - 07/04/21 0815       Check-In   Supervising physician immediately available to respond to emergencies [facility] MD immediately available    Physician(s) Dr. Harrington Challenger    Location [facility]    Staff Present Hoy Register, MS, ACSM-CEP, Exercise Physiologist;Other    Virtual Visit No    Medication changes reported     No    Fall or balance concerns reported    No    Tobacco Cessation No Change    Warm-up and Cool-down Performed as group-led instruction    Resistance Training Performed Yes    VAD Patient? No    PAD/SET Patient? No      Pain Assessment   Currently in Pain? No/denies    Pain Score 0-No pain    Multiple Pain Sites No             Capillary Blood Glucose: No results found for this or any previous visit (from the past 24 hour(s)).    Social History   Tobacco Use  Smoking Status Former   Packs/day: 1.00   Years: 3.00   Pack years: 3.00   Types: Cigarettes   Quit date: 1999   Years since quitting: 23.6  Smokeless Tobacco Current   Types: Chew, Snuff    Goals Met:  Independence with exercise equipment Exercise tolerated well No report of cardiac concerns or symptoms Strength training completed today  Goals Unmet:  Not Applicable  Comments: checkout time is 0915   Dr. Kathie Dike is Medical Director for [facility] Pulmonary Rehab.

## 2021-07-07 ENCOUNTER — Telehealth: Payer: Self-pay | Admitting: Cardiology

## 2021-07-07 ENCOUNTER — Encounter (HOSPITAL_COMMUNITY): Payer: Managed Care, Other (non HMO)

## 2021-07-07 NOTE — Telephone Encounter (Signed)
Pt calling w/ questions regarding his cholesterol medication

## 2021-07-08 NOTE — Telephone Encounter (Signed)
Pt aware.

## 2021-07-08 NOTE — Telephone Encounter (Signed)
Called pt to inform him about the message above pt understood.

## 2021-07-08 NOTE — Telephone Encounter (Signed)
Apologize for the error. Please take 1 pill of 80 mg daily.

## 2021-07-08 NOTE — Telephone Encounter (Signed)
Pt called and stated that at the last visit you had told him that you were going to up his atorvastatin from 40 mg to 80 mg. However, he said the bottle says to only take half of the atorvastatin 80 mg. He would like some clarification. Please advise.

## 2021-07-09 ENCOUNTER — Encounter (HOSPITAL_COMMUNITY)
Admission: RE | Admit: 2021-07-09 | Discharge: 2021-07-09 | Disposition: A | Discharge status: Home / Self Care | Payer: Managed Care, Other (non HMO) | Source: Ambulatory Visit | Attending: Cardiology | Admitting: Cardiology

## 2021-07-09 ENCOUNTER — Encounter (HOSPITAL_COMMUNITY)
Admission: RE | Admit: 2021-07-09 | Discharge: 2021-07-09 | Disposition: A | Discharge status: Home / Self Care | Payer: Managed Care, Other (non HMO) | Source: Ambulatory Visit | Attending: Thoracic Surgery (Cardiothoracic Vascular Surgery) | Admitting: Thoracic Surgery (Cardiothoracic Vascular Surgery)

## 2021-07-09 ENCOUNTER — Other Ambulatory Visit: Payer: Self-pay

## 2021-07-09 ENCOUNTER — Encounter (HOSPITAL_COMMUNITY): Payer: Self-pay

## 2021-07-09 DIAGNOSIS — R590 Localized enlarged lymph nodes: Secondary | ICD-10-CM | POA: Insufficient documentation

## 2021-07-09 DIAGNOSIS — I214 Non-ST elevation (NSTEMI) myocardial infarction: Secondary | ICD-10-CM

## 2021-07-09 DIAGNOSIS — Z86718 Personal history of other venous thrombosis and embolism: Secondary | ICD-10-CM | POA: Insufficient documentation

## 2021-07-09 DIAGNOSIS — G4733 Obstructive sleep apnea (adult) (pediatric): Secondary | ICD-10-CM | POA: Insufficient documentation

## 2021-07-09 DIAGNOSIS — Z01818 Encounter for other preprocedural examination: Secondary | ICD-10-CM

## 2021-07-09 DIAGNOSIS — Z5189 Encounter for other specified aftercare: Secondary | ICD-10-CM | POA: Diagnosis not present

## 2021-07-09 HISTORY — DX: Sleep apnea, unspecified: G47.30

## 2021-07-09 HISTORY — DX: Acute myocardial infarction, unspecified: I21.9

## 2021-07-09 LAB — COMPREHENSIVE METABOLIC PANEL
ALT: 29 U/L (ref 0–44)
AST: 22 U/L (ref 15–41)
Albumin: 3.7 g/dL (ref 3.5–5.0)
Alkaline Phosphatase: 62 U/L (ref 38–126)
Anion gap: 8 (ref 5–15)
BUN: 17 mg/dL (ref 6–20)
CO2: 24 mmol/L (ref 22–32)
Calcium: 9.1 mg/dL (ref 8.9–10.3)
Chloride: 104 mmol/L (ref 98–111)
Creatinine, Ser: 1.28 mg/dL — ABNORMAL HIGH (ref 0.61–1.24)
GFR, Estimated: >60 mL/min (ref >60)
Glucose, Bld: 165 mg/dL — ABNORMAL HIGH (ref 70–99)
Potassium: 3.7 mmol/L (ref 3.5–5.1)
Sodium: 136 mmol/L (ref 135–145)
Total Bilirubin: 1.1 mg/dL (ref 0.3–1.2)
Total Protein: 6.7 g/dL (ref 6.5–8.1)

## 2021-07-09 LAB — TYPE AND SCREEN
ABO/RH(D): A POS
Antibody Screen: NEGATIVE

## 2021-07-09 LAB — SURGICAL PCR SCREEN
MRSA, PCR: NEGATIVE
Staphylococcus aureus: POSITIVE — AB

## 2021-07-09 LAB — CBC
HCT: 47.5 % (ref 39.0–52.0)
Hemoglobin: 15.9 g/dL (ref 13.0–17.0)
MCH: 28.7 pg (ref 26.0–34.0)
MCHC: 33.5 g/dL (ref 30.0–36.0)
MCV: 85.7 fL (ref 80.0–100.0)
Platelets: 189 10*3/uL (ref 150–400)
RBC: 5.54 MIL/uL (ref 4.22–5.81)
RDW: 12.5 % (ref 11.5–15.5)
WBC: 7.7 10*3/uL (ref 4.0–10.5)
nRBC: 0 % (ref 0.0–0.2)

## 2021-07-09 LAB — APTT: aPTT: 27 seconds (ref 24–36)

## 2021-07-09 LAB — PROTIME-INR
INR: 1 (ref 0.8–1.2)
Prothrombin Time: 13.4 seconds (ref 11.4–15.2)

## 2021-07-09 LAB — HEMOGLOBIN A1C
Hgb A1c MFr Bld: 6.3 % — ABNORMAL HIGH (ref 4.8–5.6)
Mean Plasma Glucose: 134.11 mg/dL

## 2021-07-09 NOTE — Pre-Procedure Instructions (Addendum)
Surgical Instructions:    Your procedure is scheduled on Friday, August 19th (07:30 AM- 09:10 AM).  Report to [facility] Main Entrance "A" at 05:30 A.M., then check in with the Admitting office.  Call this number if you have any questions prior to your surgery date, or have problems the morning of surgery:  [phone]    Remember:  Do not eat or drink after midnight the night before your surgery.   Take these medicines the morning of surgery with A SIP OF WATER:   amLODipine (NORVASC)  atorvastatin (LIPITOR) metoprolol tartrate (LOPRESSOR)  pantoprazole (PROTONIX)  IF NEEDED: cyclobenzaprine (FLEXERIL)  nitroGLYCERIN (NITROSTAT)  albuterol (VENTOLIN HFA) inhaler- Please bring with you the day of surgery.    >>Per your Surgeon's instructions, Your last dose of BRILINTA prior to surgery should be August 11th.<<  Continue taking Aspirin until the day before surgery. Do not take Aspirin on the day of surgery   As of today, STOP taking any Aleve, Naproxen, Ibuprofen, Motrin, Advil, Goody's, BC's, all herbal medications, fish oil, and all vitamins.             Special instructions:    [place]- Preparing For Surgery  Before surgery, you can play an important role. Because skin is not sterile, your skin needs to be as free of germs as possible. You can reduce the number of germs on your skin by washing with CHG (chlorahexidine gluconate) Soap before surgery.  CHG is an antiseptic cleaner which kills germs and bonds with the skin to continue killing germs even after washing.     Please do not use if you have an allergy to CHG or antibacterial soaps. If your skin becomes reddened/irritated stop using the CHG.  Do not shave (including legs and underarms) for at least 48 hours prior to first CHG shower. It is OK to shave your face.  Please follow these instructions carefully.     Shower the NIGHT BEFORE SURGERY and the MORNING OF SURGERY with CHG Soap.   If you chose to wash  your hair, wash your hair first as usual with your normal shampoo. After you shampoo, rinse your hair and body thoroughly to remove the shampoo.  Then [employer] and genitals (private parts) with your normal soap and rinse thoroughly to remove soap.  After that Use CHG Soap as you would any other liquid soap. You can apply CHG directly to the skin and wash gently with a scrungie or a clean washcloth.   Apply the CHG Soap to your body ONLY FROM THE NECK DOWN.  Do not use on open wounds or open sores. Avoid contact with your eyes, ears, mouth and genitals (private parts). Wash Face and genitals (private parts)  with your normal soap.   Wash thoroughly, paying special attention to the area where your surgery will be performed.  Thoroughly rinse your body with warm water from the neck down.  DO NOT shower/wash with your normal soap after using and rinsing off the CHG Soap.  Pat yourself dry with a CLEAN TOWEL.  Wear CLEAN PAJAMAS to bed the night before surgery  Place CLEAN SHEETS on your bed the night before your surgery  DO NOT SLEEP WITH PETS.   Day of Surgery:  Take a shower with CHG soap. Wear Clean/Comfortable clothing the morning of surgery Do not wear lotions, powders, colognes, or deodorant.   Remember to brush your teeth WITH YOUR REGULAR TOOTHPASTE. Do not wear jewelry. Do not shave 48 hours  prior to surgery.  Men may shave face and neck. Do not bring valuables to the hospital. [facility] is not responsible for any belongings or valuables.  Do NOT Smoke (Tobacco/Vaping) or drink Alcohol 24 hours prior to your procedure.  If you use a CPAP at night, you may bring all equipment for your overnight stay.   Contacts, glasses, dentures or bridgework may not be worn into surgery, please bring cases for these belongings.   For patients admitted to the hospital, discharge time will be determined by your treatment team.  Patients discharged the day of surgery will not be allowed to  drive home, and someone needs to stay with them for 24 hours.  ONLY 1 SUPPORT PERSON MAY BE PRESENT WHILE YOU ARE IN SURGERY. IF YOU ARE TO BE ADMITTED ONCE YOU ARE IN YOUR ROOM YOU WILL BE ALLOWED TWO (2) VISITORS.  Minor children may have two parents present. Special consideration for safety and communication needs will be reviewed on a case by case basis.     Please read over the following fact sheets that you were given.

## 2021-07-09 NOTE — Progress Notes (Signed)
Cardiac Individual Treatment Plan  Patient Details  Name: Zachary Weeks MRN: [id number] Date of Birth: [birth year] Referring Provider:   Flowsheet Row CARDIAC REHAB PHASE II ORIENTATION from 05/30/2021 in [facility]  Referring Provider Dr. Grandville Silos       Initial Encounter Date:  Flowsheet Row CARDIAC REHAB PHASE II ORIENTATION from 05/30/2021 in [place]  Date 05/30/21       Visit Diagnosis: No diagnosis found.  Patient's Home Medications on Admission:  Current Outpatient Medications:    albuterol (VENTOLIN HFA) 108 (90 Base) MCG/ACT inhaler, Inhale 1-2 puffs into the lungs every 6 (six) hours as needed for wheezing or shortness of breath., Disp: , Rfl:    amLODipine (NORVASC) 5 MG tablet, Take 1 tablet (5 mg total) by mouth daily., Disp: 30 tablet, Rfl: 2   ASPIRIN LOW DOSE 81 MG EC tablet, TAKE 1 TABLET BY MOUTH DAILY swallow whole, Disp: 30 tablet, Rfl: 0   atorvastatin (LIPITOR) 40 MG tablet, Take 80 mg by mouth daily., Disp: , Rfl:    atorvastatin (LIPITOR) 80 MG tablet, Take 0.5 tablets (40 mg total) by mouth daily. (Patient not taking: Reported on 07/03/2021), Disp: 90 tablet, Rfl: 3   BRILINTA 90 MG TABS tablet, TAKE 1 TABLET BY MOUTH TWICE DAILY, Disp: 60 tablet, Rfl: 0   cholecalciferol (VITAMIN D) 25 MCG (1000 UNIT) tablet, Take 1,000 Units by mouth daily., Disp: , Rfl:    cyclobenzaprine (FLEXERIL) 10 MG tablet, Take 10 mg by mouth 3 (three) times daily as needed for muscle spasms., Disp: , Rfl:    metoprolol tartrate (LOPRESSOR) 25 MG tablet, Take 1.5 tablets (37.5 mg total) by mouth 2 (two) times daily., Disp: 180 tablet, Rfl: 3   nitroGLYCERIN (NITROSTAT) 0.4 MG SL tablet, Place 1 tablet (0.4 mg total) under the tongue every 5 (five) minutes as needed for chest pain., Disp: 30 tablet, Rfl: 1   pantoprazole (PROTONIX) 40 MG tablet, Take 40 mg by mouth in the morning., Disp: , Rfl:   Past Medical History: Past Medical History:   Diagnosis Date   Asthma    DVT (deep venous thrombosis) (HCC)    Dyspnea    at times - no known reason.    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   Pneumonia    73- 32 years of age    Tobacco Use: Social History   Tobacco Use  Smoking Status Former   Packs/day: 1.00   Years: 3.00   Pack years: 3.00   Types: Cigarettes   Quit date: 1999   Years since quitting: 23.6  Smokeless Tobacco Current   Types: Chew, Snuff    Labs: Recent Review Flowsheet Data     Labs for ITP Cardiac and Pulmonary Rehab Latest Ref Rng & Units 05/05/2021 05/06/2021 06/20/2021   Cholestrol 100 - 199 mg/dL 158 - 152   LDLCALC 0 - 99 mg/dL 81 - 93   HDL >39 mg/dL 32(L) - 36(L)   Trlycerides 0 - 149 mg/dL 227(H) - 130   Hemoglobin A1c 4.8 - 5.6 % 6.6(H) - -   PHART 7.350 - 7.450 - 7.400 -   PCO2ART 32.0 - 48.0 mmHg - 42.1 -   HCO3 20.0 - 28.0 mmol/L - 25.5 -   O2SAT % - 94.6 -       Capillary Blood Glucose: Lab Results  Component Value  Date   GLUCAP 122 (H) 05/07/2021   GLUCAP 132 (H) 05/07/2021   GLUCAP 144 (H) 05/06/2021   GLUCAP 116 (H) 05/06/2021     Exercise Target Goals: Exercise Program Goal: Individual exercise prescription set using results from initial 6 min walk test and THRR while considering  patient's activity barriers and safety.   Exercise Prescription Goal: Starting with aerobic activity 30 plus minutes a day, 3 days per week for initial exercise prescription. Provide home exercise prescription and guidelines that participant acknowledges understanding prior to discharge.  Activity Barriers & Risk Stratification:  Activity Barriers & Cardiac Risk Stratification - 05/30/21 1323       Activity Barriers & Cardiac Risk Stratification   Activity Barriers Shortness of Breath    Cardiac Risk Stratification High             6 Minute Walk:  6 Minute Walk     Row Name 05/30/21  1458         6 Minute Walk   Phase Initial     Distance 1100 feet     Walk Time 6 minutes     # of Rest Breaks 0     MPH 2.08     METS 3.17     RPE 9     VO2 Peak 11.1     Symptoms No     Resting HR 87 bpm     Resting BP 108/80     Resting Oxygen Saturation  92 %     Exercise Oxygen Saturation  during 6 min walk 92 %     Max Ex. HR 100 bpm     Max Ex. BP 140/90     2 Minute Post BP 124/82              Oxygen Initial Assessment:   Oxygen Re-Evaluation:   Oxygen Discharge (Final Oxygen Re-Evaluation):   Initial Exercise Prescription:  Initial Exercise Prescription - 05/30/21 1400       Date of Initial Exercise RX and Referring Provider   Date 05/30/21    Referring Provider Dr. Grandville Silos    Expected Discharge Date 08/22/21      Treadmill   MPH 1    Grade 0    Minutes 17      NuStep   Level 1    SPM 80    Minutes 22      Prescription Details   Frequency (times per week) 3    Duration Progress to 30 minutes of continuous aerobic without signs/symptoms of physical distress      Intensity   THRR 40-80% of Max Heartrate 71-142    Ratings of Perceived Exertion 11-13    Perceived Dyspnea 0-4      Resistance Training   Training Prescription Yes    Weight 5 lbs    Reps 10-15             Perform Capillary Blood Glucose checks as needed.  Exercise Prescription Changes:   Exercise Prescription Changes     Row Name 06/09/21 1100 06/23/21 0900 07/04/21 0815         Response to Exercise   Blood Pressure (Admit) 132/92 116/80 124/76     Blood Pressure (Exercise) 138/84 128/86 138/94     Blood Pressure (Exit) 122/80 116/80 118/64     Heart Rate (Admit) 78 bpm 88 bpm 69 bpm     Heart Rate (Exercise) 104 bpm 120 bpm 97 bpm     Heart Rate (  Exit) 87 bpm 97 bpm 78 bpm     Rating of Perceived Exertion (Exercise) '12 12 12     '$ Duration Continue with 30 min of aerobic exercise without signs/symptoms of physical distress. Continue with 30 min of aerobic  exercise without signs/symptoms of physical distress. Continue with 30 min of aerobic exercise without signs/symptoms of physical distress.     Intensity THRR unchanged THRR unchanged THRR unchanged           Progression   Progression Continue to progress workloads to maintain intensity without signs/symptoms of physical distress. Continue to progress workloads to maintain intensity without signs/symptoms of physical distress. Continue to progress workloads to maintain intensity without signs/symptoms of physical distress.           Resistance Training   Training Prescription Yes Yes Yes     Weight 5 lbs 5 lbs 5 lbs     Reps 10-15 10-15 10-15     Time 10 Minutes 10 Minutes 10 Minutes           Treadmill   MPH 1.5 2 2.4     Grade 0 0 0     Minutes '17 17 17     '$ METs 2.15 2.53 2.84           NuStep   Level '1 3 4     '$ SPM 94 108 87     Minutes '22 22 22     '$ METs 2.18 2.22 2.41              Exercise Comments:   Exercise Goals and Review:   Exercise Goals     Row Name 05/30/21 1500 06/09/21 1127 07/07/21 1001         Exercise Goals   Increase Physical Activity Yes Yes Yes     Intervention Provide advice, education, support and counseling about physical activity/exercise needs.;Develop an individualized exercise prescription for aerobic and resistive training based on initial evaluation findings, risk stratification, comorbidities and participant's personal goals. Provide advice, education, support and counseling about physical activity/exercise needs.;Develop an individualized exercise prescription for aerobic and resistive training based on initial evaluation findings, risk stratification, comorbidities and participant's personal goals. Provide advice, education, support and counseling about physical activity/exercise needs.;Develop an individualized exercise prescription for aerobic and resistive training based on initial evaluation findings, risk stratification,  comorbidities and participant's personal goals.     Expected Outcomes Short Term: Attend rehab on a regular basis to increase amount of physical activity.;Long Term: Add in home exercise to make exercise part of routine and to increase amount of physical activity.;Long Term: Exercising regularly at least 3-5 days a week. Short Term: Attend rehab on a regular basis to increase amount of physical activity.;Long Term: Add in home exercise to make exercise part of routine and to increase amount of physical activity.;Long Term: Exercising regularly at least 3-5 days a week. Short Term: Attend rehab on a regular basis to increase amount of physical activity.;Long Term: Add in home exercise to make exercise part of routine and to increase amount of physical activity.;Long Term: Exercising regularly at least 3-5 days a week.     Increase Strength and Stamina Yes Yes Yes     Intervention Provide advice, education, support and counseling about physical activity/exercise needs.;Develop an individualized exercise prescription for aerobic and resistive training based on initial evaluation findings, risk stratification, comorbidities and participant's personal goals. Provide advice, education, support and counseling about physical activity/exercise needs.;Develop an individualized exercise prescription for aerobic and  resistive training based on initial evaluation findings, risk stratification, comorbidities and participant's personal goals. Provide advice, education, support and counseling about physical activity/exercise needs.;Develop an individualized exercise prescription for aerobic and resistive training based on initial evaluation findings, risk stratification, comorbidities and participant's personal goals.     Expected Outcomes Short Term: Increase workloads from initial exercise prescription for resistance, speed, and METs.;Short Term: Perform resistance training exercises routinely during rehab and add in  resistance training at home;Long Term: Improve cardiorespiratory fitness, muscular endurance and strength as measured by increased METs and functional capacity (6MWT) Short Term: Increase workloads from initial exercise prescription for resistance, speed, and METs.;Short Term: Perform resistance training exercises routinely during rehab and add in resistance training at home;Long Term: Improve cardiorespiratory fitness, muscular endurance and strength as measured by increased METs and functional capacity (6MWT) Short Term: Increase workloads from initial exercise prescription for resistance, speed, and METs.;Short Term: Perform resistance training exercises routinely during rehab and add in resistance training at home;Long Term: Improve cardiorespiratory fitness, muscular endurance and strength as measured by increased METs and functional capacity (6MWT)     Able to understand and use rate of perceived exertion (RPE) scale Yes Yes Yes     Intervention Provide education and explanation on how to use RPE scale Provide education and explanation on how to use RPE scale Provide education and explanation on how to use RPE scale     Expected Outcomes Short Term: Able to use RPE daily in rehab to express subjective intensity level;Long Term:  Able to use RPE to guide intensity level when exercising independently Short Term: Able to use RPE daily in rehab to express subjective intensity level;Long Term:  Able to use RPE to guide intensity level when exercising independently Short Term: Able to use RPE daily in rehab to express subjective intensity level;Long Term:  Able to use RPE to guide intensity level when exercising independently     Knowledge and understanding of Target Heart Rate Range (THRR) Yes Yes Yes     Intervention Provide education and explanation of THRR including how the numbers were predicted and where they are located for reference Provide education and explanation of THRR including how the numbers  were predicted and where they are located for reference Provide education and explanation of THRR including how the numbers were predicted and where they are located for reference     Expected Outcomes Short Term: Able to state/look up THRR;Long Term: Able to use THRR to govern intensity when exercising independently;Short Term: Able to use daily as guideline for intensity in rehab Short Term: Able to state/look up THRR;Long Term: Able to use THRR to govern intensity when exercising independently;Short Term: Able to use daily as guideline for intensity in rehab Short Term: Able to state/look up THRR;Long Term: Able to use THRR to govern intensity when exercising independently;Short Term: Able to use daily as guideline for intensity in rehab     Able to check pulse independently Yes Yes Yes     Intervention Provide education and demonstration on how to check pulse in carotid and radial arteries.;Review the importance of being able to check your own pulse for safety during independent exercise Provide education and demonstration on how to check pulse in carotid and radial arteries.;Review the importance of being able to check your own pulse for safety during independent exercise Provide education and demonstration on how to check pulse in carotid and radial arteries.;Review the importance of being able to check your own pulse for safety  during independent exercise     Expected Outcomes Short Term: Able to explain why pulse checking is important during independent exercise;Long Term: Able to check pulse independently and accurately Short Term: Able to explain why pulse checking is important during independent exercise;Long Term: Able to check pulse independently and accurately Short Term: Able to explain why pulse checking is important during independent exercise;Long Term: Able to check pulse independently and accurately     Understanding of Exercise Prescription Yes Yes Yes     Intervention Provide education,  explanation, and written materials on patient's individual exercise prescription Provide education, explanation, and written materials on patient's individual exercise prescription Provide education, explanation, and written materials on patient's individual exercise prescription     Expected Outcomes Short Term: Able to explain program exercise prescription;Long Term: Able to explain home exercise prescription to exercise independently Short Term: Able to explain program exercise prescription;Long Term: Able to explain home exercise prescription to exercise independently Short Term: Able to explain program exercise prescription;Long Term: Able to explain home exercise prescription to exercise independently              Exercise Goals Re-Evaluation :  Exercise Goals Re-Evaluation     Row Name 06/09/21 1127 07/07/21 1002           Exercise Goal Re-Evaluation   Exercise Goals Review Increase Physical Activity;Increase Strength and Stamina;Able to understand and use rate of perceived exertion (RPE) scale;Knowledge and understanding of Target Heart Rate Range (THRR);Able to check pulse independently;Understanding of Exercise Prescription Increase Physical Activity;Increase Strength and Stamina;Able to understand and use rate of perceived exertion (RPE) scale;Knowledge and understanding of Target Heart Rate Range (THRR);Able to check pulse independently;Understanding of Exercise Prescription      Comments Pt has attended 5 sessions of cardiac rehab. He is motivated to increase his workloads. His BPs have been elevated and his doctors have been notified. He has tolerated exercise well so far. He is currently exercising at 2.18 METs on the stepper. Will continue to monitor and progress as able. Pt has attended 15 sessions of cardiac rehab. He continues to progress his workloads and is eager to exercise. He has been stressed lately, as a lymph node in his chest has significantly grown over the past few  months. He is scheduled for a biopsy at the end of the week. It is unclear how much time he will miss after this. He is currently exercising at 2.41 METs on the stepper. Will continue to monitor and progress as able.      Expected Outcomes Through exercise at rehab and at home, the patient will meet their stated goals. Through exercise at rehab and at home, the patient will meet their stated goals.                Discharge Exercise Prescription (Final Exercise Prescription Changes):  Exercise Prescription Changes - 07/04/21 0815       Response to Exercise   Blood Pressure (Admit) 124/76    Blood Pressure (Exercise) 138/94    Blood Pressure (Exit) 118/64    Heart Rate (Admit) 69 bpm    Heart Rate (Exercise) 97 bpm    Heart Rate (Exit) 78 bpm    Rating of Perceived Exertion (Exercise) 12    Duration Continue with 30 min of aerobic exercise without signs/symptoms of physical distress.    Intensity THRR unchanged      Progression   Progression Continue to progress workloads to maintain intensity without signs/symptoms of physical distress.  Resistance Training   Training Prescription Yes    Weight 5 lbs    Reps 10-15    Time 10 Minutes      Treadmill   MPH 2.4    Grade 0    Minutes 17    METs 2.84      NuStep   Level 4    SPM 87    Minutes 22    METs 2.41             Nutrition:  Target Goals: Understanding of nutrition guidelines, daily intake of sodium '1500mg'$ , cholesterol '200mg'$ , calories 30% from fat and 7% or less from saturated fats, daily to have 5 or more servings of fruits and vegetables.  Biometrics:  Pre Biometrics - 05/30/21 1501       Pre Biometrics   Height 6' (1.829 m)    Waist Circumference 57 inches    Hip Circumference 51 inches    Waist to Hip Ratio 1.12 %    Triceps Skinfold 27 mm    % Body Fat 42.4 %    Grip Strength 57.9 kg    Flexibility 0 in    Single Leg Stand 27.64 seconds              Nutrition Therapy Plan and  Nutrition Goals:  Nutrition Therapy & Goals - 06/02/21 1415       Personal Nutrition Goals   Comments Patient scored 50 on his diet assessment. We offer 2 educational sessions on heart healthy nutrition with handouts and offer assistance with RD referral if patient is interested.      Intervention Plan   Intervention Nutrition handout(s) given to patient.             Nutrition Assessments:  Nutrition Assessments - 05/30/21 1326       MEDFICTS Scores   Pre Score 50            MEDIFICTS Score Key: ?70 Need to make dietary changes  40-70 Heart Healthy Diet ? 40 Therapeutic Level Cholesterol Diet   Picture Your Plate Scores: [id number] Unhealthy dietary pattern with much room for improvement. 41-50 Dietary pattern unlikely to meet recommendations for good health and room for improvement. 51-60 More healthful dietary pattern, with some room for improvement.  >60 Healthy dietary pattern, although there may be some specific behaviors that could be improved.    Nutrition Goals Re-Evaluation:   Nutrition Goals Discharge (Final Nutrition Goals Re-Evaluation):   Psychosocial: Target Goals: Acknowledge presence or absence of significant depression and/or stress, maximize coping skills, provide positive support system. Participant is able to verbalize types and ability to use techniques and skills needed for reducing stress and depression.  Initial Review & Psychosocial Screening:  Initial Psych Review & Screening - 05/30/21 1328       Initial Review   Current issues with None Identified      Family Dynamics   Good Support System? Yes    Comments His support system includes his family. He also has several friends who check in on him regularly.      Barriers   Psychosocial barriers to participate in program There are no identifiable barriers or psychosocial needs.      Screening Interventions   Interventions Encouraged to exercise    Expected Outcomes Long Term Goal:  Stressors or current issues are controlled or eliminated.             Quality of Life Scores:  Quality of Life - 05/30/21 1454  Quality of Life   Select Quality of Life      Quality of Life Scores   Health/Function Pre 17.43 %    Socioeconomic Pre 19.56 %    Psych/Spiritual Pre 23.57 %    Family Pre 17.4 %    GLOBAL Pre 19.19 %            Scores of 19 and below usually indicate a poorer quality of life in these areas.  A difference of  2-3 points is a clinically meaningful difference.  A difference of 2-3 points in the total score of the Quality of Life Index has been associated with significant improvement in overall quality of life, self-image, physical symptoms, and general health in studies assessing change in quality of life.  PHQ-9: Recent Review Flowsheet Data     Depression screen [facility] 2/9 05/30/2021   Decreased Interest 0   Down, Depressed, Hopeless 0   PHQ - 2 Score 0   Altered sleeping 0   Tired, decreased energy 0   Change in appetite 0   Feeling bad or failure about yourself  0   Trouble concentrating 0   Moving slowly or fidgety/restless 0   Suicidal thoughts 0   PHQ-9 Score 0   Difficult doing work/chores Not difficult at all      Interpretation of Total Score  Total Score Depression Severity:  1-4 = Minimal depression, 5-9 = Mild depression, 10-14 = Moderate depression, 15-19 = Moderately severe depression, 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 Pt has no psychosocial issues. He scored a 0 on his PHQ-9. He does report that finances are a bit tight now, but he is not stressed about it. He has now been cleared by both cardiology and pulmonary to return to work, which he will on  Monday. He reports that he has a great support system with his family and his friends. Since his hospitial stay from his [facility] he has cleaned up his diet and has cut out friend foods and is limiting his consumption of sugary beverages. His goals while in the program are to continue to lose weight and decrease his SOB with exertion. He is eager to start the program and meet his goals.    Expected Outcomes The pt will continue to not have any identfiable psychosocial issues.    Continue Psychosocial Services  No Follow up required             Psychosocial Re-Evaluation:  Psychosocial Re-Evaluation     [place] Name 06/02/21 1414 06/30/21 1314           Psychosocial Re-Evaluation   Current issues with None Identified None Identified      Comments Patient is new to the program completing 2 sessions. He continues to have no psychosocial barriers identified. We will continue to monitor for progress. Patient continues to have no psychosocial barriers identified. He has completed 13 sessions and is doing well in the program. He demonstrataes an interest in improving his health. We will continue to monitor for progress.      Expected Outcomes Patient will continue to have no psychosocial issues identified. Patient will continue to have no psychosocial issues identified.      Interventions Encouraged to attend Cardiac  Rehabilitation for the exercise;Relaxation education;Stress management education Encouraged to attend Cardiac Rehabilitation for the exercise;Relaxation education;Stress management education      Continue Psychosocial Services  No Follow up required No Follow up required               Psychosocial Discharge (Final Psychosocial Re-Evaluation):  Psychosocial Re-Evaluation - 06/30/21 1314       Psychosocial Re-Evaluation   Current issues with None Identified    Comments Patient continues to have no psychosocial barriers identified. He has completed 13 sessions and is doing well in  the program. He demonstrataes an interest in improving his health. We will continue to monitor for progress.    Expected Outcomes Patient will continue to have no psychosocial issues identified.    Interventions Encouraged to attend Cardiac Rehabilitation for the exercise;Relaxation education;Stress management education    Continue Psychosocial Services  No Follow up required             Vocational Rehabilitation: Provide vocational rehab assistance to qualifying candidates.   Vocational Rehab Evaluation & Intervention:  Vocational Rehab - 05/30/21 1333       Initial Vocational Rehab Evaluation & Intervention   Assessment shows need for Vocational Rehabilitation No             Education: Education Goals: Education classes will be provided on a weekly basis, covering required topics. Participant will state understanding/return demonstration of topics presented.  Learning Barriers/Preferences:  Learning Barriers/Preferences - 05/30/21 1329       Learning Barriers/Preferences   Learning Barriers None    Learning Preferences Skilled Demonstration;Individual Instruction             Education Topics: Hypertension, Hypertension Reduction -Define heart disease and high blood pressure. Discus how high blood pressure affects the body and ways to reduce high blood pressure.   Exercise and Your Heart -Discuss why it is important to exercise, the FITT principles of exercise, normal and abnormal responses to exercise, and how to exercise safely. Flowsheet Row CARDIAC REHAB PHASE II EXERCISE from 06/11/2021 in [place]  Date 06/04/21  Educator DF  Instruction Review Code 2- Demonstrated Understanding       Angina -Discuss definition of angina, causes of angina, treatment of angina, and how to decrease risk of having angina. Flowsheet Row CARDIAC REHAB PHASE II EXERCISE from 06/11/2021 in [place]  Date 06/11/21  Educator  DF  Instruction Review Code 2- Demonstrated Understanding       Cardiac Medications -Review what the following cardiac medications are used for, how they affect the body, and side effects that may occur when taking the medications.  Medications include Aspirin, Beta blockers, calcium channel blockers, ACE Inhibitors, angiotensin receptor blockers, diuretics, digoxin, and antihyperlipidemics. Flowsheet Row CARDIAC REHAB PHASE II EXERCISE from 07/02/2021 in [place]  Date 06/18/21  Educator pb  Instruction Review Code 1- Verbalizes Understanding       Congestive Heart Failure -Discuss the definition of CHF, how to live with CHF, the signs and symptoms of CHF, and how keep track of weight and sodium intake. Flowsheet Row CARDIAC REHAB PHASE II EXERCISE from 07/02/2021 in [place]  Date 06/25/21  Educator DF  Instruction Review Code 2- Demonstrated Understanding       Heart Disease and Intimacy -Discus the effect sexual activity has on the heart, how changes occur during intimacy as we age, and safety during sexual activity. Flowsheet Row CARDIAC REHAB PHASE II EXERCISE  from 07/02/2021 in [place]  Date 07/02/21  Educator DF  Instruction Review Code 2- Demonstrated Understanding       Smoking Cessation / COPD -Discuss different methods to quit smoking, the health benefits of quitting smoking, and the definition of COPD.   Nutrition I: Fats -Discuss the types of cholesterol, what cholesterol does to the heart, and how cholesterol levels can be controlled.   Nutrition II: Labels -Discuss the different components of food labels and how to read food label   Heart Parts/Heart Disease and PAD -Discuss the anatomy of the heart, the pathway of blood circulation through the heart, and these are affected by heart disease.   Stress I: Signs and Symptoms -Discuss the causes of stress, how stress may lead to  anxiety and depression, and ways to limit stress.   Stress II: Relaxation -Discuss different types of relaxation techniques to limit stress.   Warning Signs of Stroke / TIA -Discuss definition of a stroke, what the signs and symptoms are of a stroke, and how to identify when someone is having stroke.   Knowledge Questionnaire Score:  Knowledge Questionnaire Score - 05/30/21 1329       Knowledge Questionnaire Score   Pre Score 20/24             Core Components/Risk Factors/Patient Goals at Admission:  Personal Goals and Risk Factors at Admission - 05/30/21 1333       Core Components/Risk Factors/Patient Goals on Admission    Weight Management Yes;Obesity;Weight Maintenance;Weight Loss    Intervention Weight Management: Develop a combined nutrition and exercise program designed to reach desired caloric intake, while maintaining appropriate intake of nutrient and fiber, sodium and fats, and appropriate energy expenditure required for the weight goal.;Weight Management: Provide education and appropriate resources to help participant work on and attain dietary goals.;Weight Management/Obesity: Establish reasonable short term and long term weight goals.;Obesity: Provide education and appropriate resources to help participant work on and attain dietary goals.    Expected Outcomes Short Term: Continue to assess and modify interventions until short term weight is achieved;Long Term: Adherence to nutrition and physical activity/exercise program aimed toward attainment of established weight goal;Weight Maintenance: Understanding of the daily nutrition guidelines, which includes 25-35% calories from fat, 7% or less cal from saturated fats, less than '200mg'$  cholesterol, less than 1.5gm of sodium, & 5 or more servings of fruits and vegetables daily;Weight Loss: Understanding of general recommendations for a balanced deficit meal plan, which promotes 1-2 lb weight loss per week and includes a negative  energy balance of [id number] kcal/d;Understanding recommendations for meals to include 15-35% energy as protein, 25-35% energy from fat, 35-60% energy from carbohydrates, less than '200mg'$  of dietary cholesterol, 20-35 gm of total fiber daily;Understanding of distribution of calorie intake throughout the day with the consumption of 4-5 meals/snacks    Improve shortness of breath with ADL's Yes    Intervention Provide education, individualized exercise plan and daily activity instruction to help decrease symptoms of SOB with activities of daily living.    Expected Outcomes Short Term: Improve cardiorespiratory fitness to achieve a reduction of symptoms when performing ADLs;Long Term: Be able to perform more ADLs without symptoms or delay the onset of symptoms    Heart Failure Yes    Intervention Provide a combined exercise and nutrition program that is supplemented with education, support and counseling about heart failure. Directed toward relieving symptoms such as shortness of breath, decreased exercise tolerance, and extremity edema.    Expected  Outcomes Improve functional capacity of life;Short term: Attendance in program 2-3 days a week with increased exercise capacity. Reported lower sodium intake. Reported increased fruit and vegetable intake. Reports medication compliance.;Short term: Daily weights obtained and reported for increase. Utilizing diuretic protocols set by physician.;Long term: Adoption of self-care skills and reduction of barriers for early signs and symptoms recognition and intervention leading to self-care maintenance.    Hypertension Yes    Intervention Provide education on lifestyle modifcations including regular physical activity/exercise, weight management, moderate sodium restriction and increased consumption of fresh fruit, vegetables, and low fat dairy, alcohol moderation, and smoking cessation.;Monitor prescription use compliance.    Expected Outcomes Short Term: Continued  assessment and intervention until BP is < 140/10m HG in hypertensive participants. < 130/872mHG in hypertensive participants with diabetes, heart failure or chronic kidney disease.;Long Term: Maintenance of blood pressure at goal levels.    Lipids Yes    Intervention Provide education and support for participant on nutrition & aerobic/resistive exercise along with prescribed medications to achieve LDL '70mg'$ , HDL >'40mg'$ .    Expected Outcomes Short Term: Participant states understanding of desired cholesterol values and is compliant with medications prescribed. Participant is following exercise prescription and nutrition guidelines.;Long Term: Cholesterol controlled with medications as prescribed, with individualized exercise RX and with personalized nutrition plan. Value goals: LDL < '70mg'$ , HDL > 40 mg.             Core Components/Risk Factors/Patient Goals Review:   Goals and Risk Factor Review     Row Name 06/02/21 1416 06/30/21 1315           Core Components/Risk Factors/Patient Goals Review   Personal Goals Review Other Other      Review Paitent referred to CR with NSTEMI. He has multiple risk factors for CAD and is participating in the program for risk modification. He has completed 1 sessions. His personal goals for the program are to continue to lose weight and get stronger. We will continue to monitor his progress as he works toward meeting these personal goals. Patient has completed 13 sessions losing 3 lbs since his initial visit. He is doing well in the program with consistent attendance and progression. His blood pressure has been hypertensive since he started. Dr. PaVirgina Jockas been notified. He added Amlodipine 5 mg 06/13/21 with no improvement. Dr. PaVirgina Jockas also notified due to patient having a 5 beat run of NVT during exercise with no symptoms. He saw him 06/16/21 and he increased his Metoprolol from 25 mg BID to 37.5 mg BID and ordered a monitor. Patient is not wearing the  monitor yet. His personal goals for the program are to continue to lose weight and decrease his SOB with exertion. We will continue to monitor his progress as he works towards meeting these goals and continue to monitor his blood pressure.      Expected Outcomes Patient will complete the program meeting both personal and program goal.s Patient will complete the program meeting both personal and program goal.s               Core Components/Risk Factors/Patient Goals at Discharge (Final Review):   Goals and Risk Factor Review - 06/30/21 1315       Core Components/Risk Factors/Patient Goals Review   Personal Goals Review Other    Review Patient has completed 13 sessions losing 3 lbs since his initial visit. He is doing well in the program with consistent attendance and progression. His blood pressure has been hypertensive  since he started. Dr. Virgina Jock has been notified. He added Amlodipine 5 mg 06/13/21 with no improvement. Dr. Virgina Jock was also notified due to patient having a 5 beat run of NVT during exercise with no symptoms. He saw him 06/16/21 and he increased his Metoprolol from 25 mg BID to 37.5 mg BID and ordered a monitor. Patient is not wearing the monitor yet. His personal goals for the program are to continue to lose weight and decrease his SOB with exertion. We will continue to monitor his progress as he works towards meeting these goals and continue to monitor his blood pressure.    Expected Outcomes Patient will complete the program meeting both personal and program goal.s             ITP Comments:   Comments: ITP REVIEW Pt is making expected progress toward Cardiac Rehab goals after completing 15 sessions. Recommend continued exercise, life style modification, education, and increased stamina and strength.

## 2021-07-09 NOTE — Progress Notes (Signed)
Daily Session Note  Patient Details  Name: Zachary Weeks MRN: [id number] Date of Birth: [birth year] Referring Provider:   Flowsheet Row CARDIAC REHAB PHASE II ORIENTATION from 05/30/2021 in [place]  Referring Provider Dr. Grandville Silos       Encounter Date: 07/09/2021  Check In:  Session Check In - 07/09/21 0815       Check-In   Supervising physician immediately available to respond to emergencies [facility] MD immediately available    Physician(s) Dr. Harl Bowie    Location [facility]    Staff Present Hoy Register, MS, ACSM-CEP, Exercise Physiologist;Other    Medication changes reported     No    Fall or balance concerns reported    No    Tobacco Cessation No Change    Warm-up and Cool-down Performed as group-led instruction    Resistance Training Performed Yes    VAD Patient? No    PAD/SET Patient? No      Pain Assessment   Currently in Pain? No/denies    Pain Score 0-No pain    Multiple Pain Sites No             Capillary Blood Glucose: No results found for this or any previous visit (from the past 24 hour(s)).    Social History   Tobacco Use  Smoking Status Former   Packs/day: 1.00   Years: 3.00   Pack years: 3.00   Types: Cigarettes   Quit date: 1999   Years since quitting: 23.6  Smokeless Tobacco Current   Types: Chew, Snuff    Goals Met:  Independence with exercise equipment Exercise tolerated well No report of cardiac concerns or symptoms Strength training completed today  Goals Unmet:  Not Applicable  Comments: checkout time is 0915   Dr. Kathie Dike is Medical Director for [facility] Pulmonary Re[facility].

## 2021-07-09 NOTE — Progress Notes (Signed)
PCP - Gar Ponto, MD Cardiologist - Jackquline Berlin, MD Pulmonologist- Kara Mead, MD  PPM/ICD - Denies  Chest x-ray - 07/09/21 EKG - 06/26/21 Stress Test - Denies ECHO - 05/06/21 Cardiac Cath - 05/05/21  Sleep Study - Yes, positive for OSA CPAP - Yes  Per pt, he is pre-diabetic, does not check CBGs. A1C obtained  Blood Thinner Instructions: Per pt, last dose Brillinta 07/03/21 Aspirin Instructions: Pt instructed to sontinue taking until day before sx; none on DOS  ERAS Protcol - None PRE-SURGERY Ensure or G2- N/A  COVID TEST- N/A; Ambulatory   Anesthesia review: Yes, cardiac hx  Patient denies shortness of breath, fever, cough and chest pain at PAT appointment   All instructions explained to the patient, with a verbal understanding of the material. Patient agrees to go over the instructions while at home for a better understanding. The opportunity to ask questions was provided.

## 2021-07-09 NOTE — Progress Notes (Signed)
Clarified w/ Levonne Spiller: okay for pt to take ASA until day before sx; do not take ASA DOS.

## 2021-07-10 NOTE — Anesthesia Preprocedure Evaluation (Addendum)
Anesthesia Evaluation  Patient identified by MRN, date of birth, ID band Patient awake    Reviewed: Allergy & Precautions, NPO status , Patient's Chart, lab work & pertinent test results  Airway Mallampati: III  TM Distance: >3 FB Neck ROM: Full    Dental  (+) Dental Advisory Given   Pulmonary shortness of breath, asthma , sleep apnea and Continuous Positive Airway Pressure Ventilation , former smoker,    breath sounds clear to auscultation       Cardiovascular hypertension, Pt. on medications + CAD   Rhythm:Regular Rate:Normal     Neuro/Psych negative neurological ROS     GI/Hepatic Neg liver ROS, GERD  ,  Endo/Other  negative endocrine ROS  Renal/GU Renal InsufficiencyRenal disease     Musculoskeletal   Abdominal   Peds  Hematology negative hematology ROS (+)   Anesthesia Other Findings   Reproductive/Obstetrics                             Lab Results  Component Value Date   WBC 7.7 07/09/2021   HGB 15.9 07/09/2021   HCT 47.5 07/09/2021   MCV 85.7 07/09/2021   PLT 189 07/09/2021   Lab Results  Component Value Date   CREATININE 1.28 (H) 07/09/2021   BUN 17 07/09/2021   NA 136 07/09/2021   K 3.7 07/09/2021   CL 104 07/09/2021   CO2 24 07/09/2021    Anesthesia Physical Anesthesia Plan  ASA: 3  Anesthesia Plan: General   Post-op Pain Management:    Induction: Intravenous  PONV Risk Score and Plan: 2 and Dexamethasone, Ondansetron and Treatment may vary due to age or medical condition  Airway Management Planned: Oral ETT  Additional Equipment:   Intra-op Plan:   Post-operative Plan: Extubation in OR  Informed Consent: I have reviewed the patients History and Physical, chart, labs and discussed the procedure including the risks, benefits and alternatives for the proposed anesthesia with the patient or authorized representative who has indicated his/her understanding  and acceptance.     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 Patient was seen in [facility] emergency department on 06/16/2021 with complaints of chest pain. EKG showed no ischemia. Chest x-ray showed no acute abnormalities. High-sensitivity troponin was 87, negative. Patient also underwent CT angiogram that showed no PE. CT chest redemonstrated mediastinal and bilateral hilar lymphadenopathy with slight increase in size of subcarinal nodal conglomeration, but otherwise stable superficial mediastinal and hilar lymph nodes when compared to 2020 scan.  Patient last seen by cardiologist Dr. Virgina Jock outpatient on 06/26/2021.  Reportedly doing well from a cardiovascular standpoint, participating in cardiac rehab.  Discussed upcoming mediastinal biopsy and advised he can hold Brilinta 5 days prior.  Nonspecific hilar and mediastinal adenopathy followed by pulmonology.  Prior EBUS was nondiagnostic so he has been referred for mediastinoscopy and biopsy.  Preop labs reviewed, unremarkable.  PFT 06/26/2021: FVC-%Pred-Pre Latest Units: % 80 FEV1-%Pred-Pre Latest Units: % 86 FEV1FVC-%Pred-Pre Latest Units: % 106 TLC % pred Latest Units: % 84 RV % pred Latest Units: % 91 DLCO unc % pred Latest Units: % 119  EKG 06/26/2021: Sinus rhythm 76 bpm Possible old anteroseptal infarct  CTA chest 06/16/2021: 1. No acute cardiopulmonary disease. Specifically, no evidence of pulmonary embolism. 2. 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 3. Borderline cardiomegaly, unchanged. 4. Minimal coronary artery calcifications. Aortic Atherosclerosis (ICD10-I70.0). 5. Suspected hepatic steatosis. Correlation with LFTs is advised.  Echocardiogram 05/06/2021: 1. Left ventricular ejection fraction, by estimation, is 60 to 65%. The  left ventricle has normal function. The left ventricle has no regional  wall motion abnormalities. There is moderate asymmetric left ventricular  hypertrophy. Left ventricular  diastolic parameters were normal.  2. Right ventricular systolic function is normal. The right ventricular  size is normal.  3. No significant valvular abnormality.  4. The inferior vena cava is normal in size with greater than 50%  respiratory variability, suggesting right atrial pressure of 3 mmHg.  Coronary angiogram 05/05/2021: LM: Very large. Normal LAD: Very large. 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 Normal RCA: Very large. Difficult to opacify distally, but probably normal.  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 No treatable lesion identified. Recommend DAPT with Aspirin and brilinta. )       Anesthesia Quick Evaluation

## 2021-07-10 NOTE — Progress Notes (Signed)
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 Patient was seen in [facility] emergency department on 06/16/2021 with complaints of chest pain.  EKG showed no ischemia.  Chest x-ray showed no acute abnormalities.  High-sensitivity troponin was 87, negative.  Patient also underwent CT angiogram that showed no PE.  CT chest redemonstrated mediastinal and bilateral hilar lymphadenopathy with slight increase in size of subcarinal nodal conglomeration, but otherwise stable superficial mediastinal and hilar lymph nodes when compared to 2020 scan.  Patient last seen by cardiologist Dr. Virgina Jock outpatient on 06/26/2021.  Reportedly doing well from a cardiovascular standpoint, participating in cardiac rehab.  Discussed upcoming mediastinal biopsy and advised he can hold Brilinta 5 days prior.  Nonspecific hilar and mediastinal adenopathy followed by pulmonology.  Prior EBUS was nondiagnostic so he has been referred for mediastinoscopy and biopsy.  Preop labs reviewed, unremarkable.  PFT 06/26/2021: FVC-%Pred-Pre Latest Units: % 80  FEV1-%Pred-Pre Latest Units: % 86  FEV1FVC-%Pred-Pre Latest Units: % 106  TLC % pred Latest Units: % 84  RV % pred Latest Units: % 91  DLCO unc % pred Latest Units: % 119   EKG 06/26/2021: Sinus rhythm 76 bpm Possible old anteroseptal infarct   CTA chest 06/16/2021: 1. No acute cardiopulmonary disease. Specifically, no evidence of pulmonary embolism. 2. 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 3. Borderline cardiomegaly, unchanged. 4. Minimal coronary artery calcifications.  Aortic Atherosclerosis (ICD10-I70.0). 5. Suspected hepatic steatosis.  Correlation with LFTs is advised.   Echocardiogram 05/06/2021:  1. Left ventricular ejection fraction, by estimation, is 60 to 65%. The  left ventricle has normal function. The left ventricle has no regional  wall motion abnormalities. There is moderate asymmetric left ventricular  hypertrophy. Left ventricular  diastolic parameters were normal.   2. Right ventricular systolic function is normal. The right ventricular  size is normal.   3. No significant valvular abnormality.   4. The inferior vena cava is normal in size with greater than 50%  respiratory variability, suggesting right atrial pressure of 3 mmHg.   Coronary angiogram 05/05/2021: LM:  Very large. Normal LAD: Very large. 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 Normal RCA: Very large. Difficult to opacify distally, but probably normal.   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 No treatable lesion identified. Recommend DAPT with Aspirin and brilinta.   Wynonia Musty [facility] Stay [facility]/Anesthesiology Phone [phone] 07/10/2021 10:33 AM

## 2021-07-11 ENCOUNTER — Other Ambulatory Visit: Payer: Self-pay

## 2021-07-11 ENCOUNTER — Encounter (HOSPITAL_COMMUNITY): Payer: Self-pay | Admitting: Thoracic Surgery (Cardiothoracic Vascular Surgery)

## 2021-07-11 ENCOUNTER — Ambulatory Visit (HOSPITAL_COMMUNITY): Payer: Managed Care, Other (non HMO) | Admitting: Physician Assistant

## 2021-07-11 ENCOUNTER — Encounter (HOSPITAL_COMMUNITY): Payer: Managed Care, Other (non HMO)

## 2021-07-11 ENCOUNTER — Observation Stay (HOSPITAL_COMMUNITY)
Admission: RE | Admit: 2021-07-11 | Discharge: 2021-07-12 | Disposition: A | Discharge status: Home / Self Care | Payer: Managed Care, Other (non HMO) | Attending: Thoracic Surgery (Cardiothoracic Vascular Surgery) | Admitting: Thoracic Surgery (Cardiothoracic Vascular Surgery)

## 2021-07-11 ENCOUNTER — Encounter (HOSPITAL_COMMUNITY)
Admission: RE | Disposition: A | Discharge status: Home / Self Care | Payer: Self-pay | Source: Home / Self Care | Attending: Thoracic Surgery (Cardiothoracic Vascular Surgery)

## 2021-07-11 ENCOUNTER — Ambulatory Visit (HOSPITAL_COMMUNITY): Payer: Managed Care, Other (non HMO) | Admitting: Registered Nurse

## 2021-07-11 DIAGNOSIS — J45909 Unspecified asthma, uncomplicated: Secondary | ICD-10-CM | POA: Diagnosis not present

## 2021-07-11 DIAGNOSIS — I129 Hypertensive chronic kidney disease with stage 1 through stage 4 chronic kidney disease, or unspecified chronic kidney disease: Secondary | ICD-10-CM | POA: Insufficient documentation

## 2021-07-11 DIAGNOSIS — I2510 Atherosclerotic heart disease of native coronary artery without angina pectoris: Secondary | ICD-10-CM | POA: Insufficient documentation

## 2021-07-11 DIAGNOSIS — J9859 Other diseases of mediastinum, not elsewhere classified: Secondary | ICD-10-CM | POA: Diagnosis present

## 2021-07-11 DIAGNOSIS — I898 Other specified noninfective disorders of lymphatic vessels and lymph nodes: Principal | ICD-10-CM | POA: Insufficient documentation

## 2021-07-11 DIAGNOSIS — Z7982 Long term (current) use of aspirin: Secondary | ICD-10-CM | POA: Insufficient documentation

## 2021-07-11 DIAGNOSIS — Z87891 Personal history of nicotine dependence: Secondary | ICD-10-CM | POA: Insufficient documentation

## 2021-07-11 DIAGNOSIS — Z79899 Other long term (current) drug therapy: Secondary | ICD-10-CM | POA: Diagnosis not present

## 2021-07-11 DIAGNOSIS — N183 Chronic kidney disease, stage 3 unspecified: Secondary | ICD-10-CM | POA: Insufficient documentation

## 2021-07-11 DIAGNOSIS — R590 Localized enlarged lymph nodes: Secondary | ICD-10-CM | POA: Diagnosis present

## 2021-07-11 HISTORY — PX: MEDIASTINOSCOPY: SHX5086

## 2021-07-11 LAB — ABO/RH: ABO/RH(D): A POS

## 2021-07-11 SURGERY — MEDIASTINOSCOPY
Anesthesia: General | Site: Neck

## 2021-07-11 MED ORDER — PANTOPRAZOLE SODIUM 40 MG PO TBEC
40.0000 mg | DELAYED_RELEASE_TABLET | Freq: Every morning | ORAL | Status: DC
  Administered 2021-07-12: 40 mg via ORAL
  Filled 2021-07-11: qty 1

## 2021-07-11 MED ORDER — SUCCINYLCHOLINE CHLORIDE 200 MG/10ML IV SOSY
PREFILLED_SYRINGE | INTRAVENOUS | Status: DC | PRN
  Administered 2021-07-11: 120 mg via INTRAVENOUS

## 2021-07-11 MED ORDER — SUGAMMADEX SODIUM 200 MG/2ML IV SOLN
INTRAVENOUS | Status: DC | PRN
  Administered 2021-07-11: 400 mg via INTRAVENOUS

## 2021-07-11 MED ORDER — 0.9 % SODIUM CHLORIDE (POUR BTL) OPTIME
TOPICAL | Status: DC | PRN
  Administered 2021-07-11: 1000 mL

## 2021-07-11 MED ORDER — PROPOFOL 10 MG/ML IV BOLUS
INTRAVENOUS | Status: AC
  Filled 2021-07-11: qty 20

## 2021-07-11 MED ORDER — ONDANSETRON HCL 4 MG/2ML IJ SOLN
INTRAMUSCULAR | Status: DC | PRN
Start: 2021-07-11 — End: 2021-07-11
  Administered 2021-07-11: 4 mg via INTRAVENOUS

## 2021-07-11 MED ORDER — FENTANYL CITRATE (PF) 250 MCG/5ML IJ SOLN
INTRAMUSCULAR | Status: AC
  Filled 2021-07-11: qty 5

## 2021-07-11 MED ORDER — BACITRACIN ZINC 500 UNIT/GM EX OINT
TOPICAL_OINTMENT | Freq: Two times a day (BID) | CUTANEOUS | Status: DC
  Filled 2021-07-11: qty 28.4

## 2021-07-11 MED ORDER — ALBUTEROL SULFATE (2.5 MG/3ML) 0.083% IN NEBU: 2.5000 mg | INHALATION_SOLUTION | Freq: Four times a day (QID) | RESPIRATORY_TRACT | Status: DC | PRN

## 2021-07-11 MED ORDER — CEFAZOLIN IN SODIUM CHLORIDE 3-0.9 GM/100ML-% IV SOLN
3.0000 g | INTRAVENOUS | Status: AC
  Administered 2021-07-11: 3 g via INTRAVENOUS
  Filled 2021-07-11: qty 100

## 2021-07-11 MED ORDER — PROPOFOL 10 MG/ML IV BOLUS
INTRAVENOUS | Status: DC | PRN
  Administered 2021-07-11: 200 mg via INTRAVENOUS

## 2021-07-11 MED ORDER — DEXAMETHASONE SODIUM PHOSPHATE 10 MG/ML IJ SOLN
INTRAMUSCULAR | Status: DC | PRN
  Administered 2021-07-11: 10 mg via INTRAVENOUS

## 2021-07-11 MED ORDER — OXYCODONE HCL 5 MG PO TABS
ORAL_TABLET | ORAL | Status: AC
  Filled 2021-07-11: qty 2

## 2021-07-11 MED ORDER — ALUM & MAG HYDROXIDE-SIMETH 200-200-20 MG/5ML PO SUSP: 15.0000 mL | Freq: Four times a day (QID) | ORAL | Status: DC | PRN

## 2021-07-11 MED ORDER — LACTATED RINGERS IV SOLN: INTRAVENOUS | Status: DC | PRN

## 2021-07-11 MED ORDER — ACETAMINOPHEN 500 MG PO TABS
1000.0000 mg | ORAL_TABLET | Freq: Once | ORAL | Status: AC
  Administered 2021-07-11: 1000 mg via ORAL
  Filled 2021-07-11: qty 2

## 2021-07-11 MED ORDER — ZOLPIDEM TARTRATE 5 MG PO TABS: 5.0000 mg | ORAL_TABLET | Freq: Every evening | ORAL | Status: DC | PRN

## 2021-07-11 MED ORDER — LACTATED RINGERS IV SOLN: INTRAVENOUS | Status: DC

## 2021-07-11 MED ORDER — LIDOCAINE 2% (20 MG/ML) 5 ML SYRINGE
INTRAMUSCULAR | Status: AC
  Filled 2021-07-11: qty 5

## 2021-07-11 MED ORDER — ONDANSETRON HCL 4 MG/2ML IJ SOLN
INTRAMUSCULAR | Status: AC
  Filled 2021-07-11: qty 2

## 2021-07-11 MED ORDER — PHENYLEPHRINE 40 MCG/ML (10ML) SYRINGE FOR IV PUSH (FOR BLOOD PRESSURE SUPPORT)
PREFILLED_SYRINGE | INTRAVENOUS | Status: DC | PRN
  Administered 2021-07-11: 160 ug via INTRAVENOUS
  Administered 2021-07-11: 200 ug via INTRAVENOUS

## 2021-07-11 MED ORDER — FENTANYL CITRATE (PF) 100 MCG/2ML IJ SOLN: 25.0000 ug | INTRAMUSCULAR | Status: DC | PRN

## 2021-07-11 MED ORDER — OXYCODONE HCL 5 MG PO TABS
5.0000 mg | ORAL_TABLET | ORAL | Status: DC | PRN
  Administered 2021-07-11: 10 mg via ORAL
  Administered 2021-07-11 (×2): 5 mg via ORAL
  Filled 2021-07-11 (×2): qty 1
  Filled 2021-07-11: qty 2
  Filled 2021-07-11: qty 1

## 2021-07-11 MED ORDER — MIDAZOLAM HCL 2 MG/2ML IJ SOLN
INTRAMUSCULAR | Status: AC
  Filled 2021-07-11: qty 2

## 2021-07-11 MED ORDER — CHLORHEXIDINE GLUCONATE 0.12 % MT SOLN
15.0000 mL | Freq: Once | OROMUCOSAL | Status: AC
  Administered 2021-07-11: 15 mL via OROMUCOSAL
  Filled 2021-07-11: qty 15

## 2021-07-11 MED ORDER — ENOXAPARIN SODIUM 40 MG/0.4ML IJ SOSY
40.0000 mg | PREFILLED_SYRINGE | INTRAMUSCULAR | Status: DC
  Administered 2021-07-12: 40 mg via SUBCUTANEOUS
  Filled 2021-07-11: qty 0.4

## 2021-07-11 MED ORDER — ALBUTEROL SULFATE HFA 108 (90 BASE) MCG/ACT IN AERS
1.0000 | INHALATION_SPRAY | Freq: Four times a day (QID) | RESPIRATORY_TRACT | Status: DC | PRN
Start: 2021-07-11 — End: 2021-07-11

## 2021-07-11 MED ORDER — AMLODIPINE BESYLATE 5 MG PO TABS
5.0000 mg | ORAL_TABLET | Freq: Every day | ORAL | Status: DC
  Administered 2021-07-12: 5 mg via ORAL
  Filled 2021-07-11: qty 1

## 2021-07-11 MED ORDER — TICAGRELOR 90 MG PO TABS
90.0000 mg | ORAL_TABLET | Freq: Two times a day (BID) | ORAL | Status: DC
  Administered 2021-07-12: 90 mg via ORAL
  Filled 2021-07-11: qty 1

## 2021-07-11 MED ORDER — ONDANSETRON HCL 4 MG/2ML IJ SOLN: 4.0000 mg | Freq: Four times a day (QID) | INTRAMUSCULAR | Status: DC | PRN

## 2021-07-11 MED ORDER — ACETAMINOPHEN 500 MG PO TABS
1000.0000 mg | ORAL_TABLET | Freq: Four times a day (QID) | ORAL | Status: DC | PRN
  Administered 2021-07-11 – 2021-07-12 (×3): 1000 mg via ORAL
  Filled 2021-07-11 (×3): qty 2

## 2021-07-11 MED ORDER — DEXAMETHASONE SODIUM PHOSPHATE 10 MG/ML IJ SOLN
INTRAMUSCULAR | Status: AC
  Filled 2021-07-11: qty 1

## 2021-07-11 MED ORDER — METOPROLOL TARTRATE 25 MG PO TABS
37.5000 mg | ORAL_TABLET | Freq: Two times a day (BID) | ORAL | Status: DC
  Administered 2021-07-11 – 2021-07-12 (×2): 37.5 mg via ORAL
  Filled 2021-07-11 (×2): qty 1

## 2021-07-11 MED ORDER — ATORVASTATIN CALCIUM 80 MG PO TABS
80.0000 mg | ORAL_TABLET | Freq: Every day | ORAL | Status: DC
  Administered 2021-07-12: 80 mg via ORAL
  Filled 2021-07-11: qty 1

## 2021-07-11 MED ORDER — PHENYLEPHRINE HCL-NACL 20-0.9 MG/250ML-% IV SOLN
INTRAVENOUS | Status: DC | PRN
  Administered 2021-07-11: 25 ug/min via INTRAVENOUS

## 2021-07-11 MED ORDER — MIDAZOLAM HCL 2 MG/2ML IJ SOLN
INTRAMUSCULAR | Status: DC | PRN
  Administered 2021-07-11: 2 mg via INTRAVENOUS

## 2021-07-11 MED ORDER — AMISULPRIDE (ANTIEMETIC) 5 MG/2ML IV SOLN: 10.0000 mg | Freq: Once | INTRAVENOUS | Status: DC | PRN

## 2021-07-11 MED ORDER — ORAL CARE MOUTH RINSE: 15.0000 mL | Freq: Once | OROMUCOSAL | Status: AC

## 2021-07-11 MED ORDER — VITAMIN D 25 MCG (1000 UNIT) PO TABS
1000.0000 [IU] | ORAL_TABLET | Freq: Every day | ORAL | Status: DC
  Administered 2021-07-11 – 2021-07-12 (×2): 1000 [IU] via ORAL
  Filled 2021-07-11 (×4): qty 1

## 2021-07-11 MED ORDER — LIDOCAINE 2% (20 MG/ML) 5 ML SYRINGE
INTRAMUSCULAR | Status: DC | PRN
  Administered 2021-07-11: 80 mg via INTRAVENOUS

## 2021-07-11 MED ORDER — FENTANYL CITRATE (PF) 250 MCG/5ML IJ SOLN
INTRAMUSCULAR | Status: DC | PRN
  Administered 2021-07-11: 100 ug via INTRAVENOUS

## 2021-07-11 MED ORDER — ASPIRIN EC 81 MG PO TBEC
81.0000 mg | DELAYED_RELEASE_TABLET | Freq: Every day | ORAL | Status: DC
  Administered 2021-07-11 – 2021-07-12 (×2): 81 mg via ORAL
  Filled 2021-07-11 (×2): qty 1

## 2021-07-11 MED ORDER — CYCLOBENZAPRINE HCL 10 MG PO TABS
10.0000 mg | ORAL_TABLET | Freq: Three times a day (TID) | ORAL | Status: DC | PRN
  Administered 2021-07-12: 10 mg via ORAL
  Filled 2021-07-11: qty 1

## 2021-07-11 MED ORDER — HEMOSTATIC AGENTS (NO CHARGE) OPTIME
TOPICAL | Status: DC | PRN
  Administered 2021-07-11: 2 via TOPICAL

## 2021-07-11 MED ORDER — EPHEDRINE SULFATE-NACL 50-0.9 MG/10ML-% IV SOSY
PREFILLED_SYRINGE | INTRAVENOUS | Status: DC | PRN
  Administered 2021-07-11: 10 mg via INTRAVENOUS
  Administered 2021-07-11: 5 mg via INTRAVENOUS

## 2021-07-11 MED ORDER — NITROGLYCERIN 0.4 MG SL SUBL: 0.4000 mg | SUBLINGUAL_TABLET | SUBLINGUAL | Status: DC | PRN

## 2021-07-11 MED ORDER — ROCURONIUM BROMIDE 10 MG/ML (PF) SYRINGE
PREFILLED_SYRINGE | INTRAVENOUS | Status: DC | PRN
  Administered 2021-07-11: 50 mg via INTRAVENOUS

## 2021-07-11 SURGICAL SUPPLY — 43 items
APPLIER CLIP LOGIC TI 5 (MISCELLANEOUS) IMPLANT
BLADE CLIPPER SURG (BLADE) IMPLANT
BLADE SURG 15 STRL LF DISP TIS (BLADE) ×1 IMPLANT
BLADE SURG 15 STRL SS (BLADE) ×1
CANISTER SUCT 3000ML PPV (MISCELLANEOUS) ×2 IMPLANT
CLIP TI MEDIUM 6 (CLIP) ×2 IMPLANT
CLIP VESOCCLUDE MED 6/CT (CLIP) ×2 IMPLANT
CNTNR URN SCR LID CUP LEK RST (MISCELLANEOUS) ×7 IMPLANT
CONT SPEC 4OZ STRL OR WHT (MISCELLANEOUS) ×7
COVER SURGICAL LIGHT HANDLE (MISCELLANEOUS) ×4 IMPLANT
DERMABOND ADVANCED (GAUZE/BANDAGES/DRESSINGS) ×1
DERMABOND ADVANCED .7 DNX12 (GAUZE/BANDAGES/DRESSINGS) ×1 IMPLANT
DRAPE CHEST BREAST 15X10 FENES (DRAPES) ×2 IMPLANT
DRAPE LAPAROSCOPIC ABDOMINAL (DRAPES) ×2 IMPLANT
ELECT REM PT RETURN 9FT ADLT (ELECTROSURGICAL) ×2
ELECTRODE REM PT RTRN 9FT ADLT (ELECTROSURGICAL) ×1 IMPLANT
GAUZE 4X4 16PLY ~~LOC~~+RFID DBL (SPONGE) ×2 IMPLANT
GLOVE TRIUMPH SURG SIZE 7.5 (KITS) ×2 IMPLANT
GOWN STRL REUS W/ TWL LRG LVL3 (GOWN DISPOSABLE) ×1 IMPLANT
GOWN STRL REUS W/ TWL XL LVL3 (GOWN DISPOSABLE) ×1 IMPLANT
GOWN STRL REUS W/TWL LRG LVL3 (GOWN DISPOSABLE) ×1
GOWN STRL REUS W/TWL XL LVL3 (GOWN DISPOSABLE) ×1
HEMOSTAT SURGICEL 2X14 (HEMOSTASIS) ×4 IMPLANT
KIT BASIN OR (CUSTOM PROCEDURE TRAY) ×2 IMPLANT
KIT TURNOVER KIT B (KITS) ×2 IMPLANT
NS IRRIG 1000ML POUR BTL (IV SOLUTION) ×2 IMPLANT
PACK GENERAL/GYN (CUSTOM PROCEDURE TRAY) ×2 IMPLANT
PAD ARMBOARD 7.5X6 YLW CONV (MISCELLANEOUS) ×4 IMPLANT
PENCIL BUTTON HOLSTER BLD 10FT (ELECTRODE) ×2 IMPLANT
SPONGE INTESTINAL PEANUT (DISPOSABLE) IMPLANT
SPONGE T-LAP 18X18 ~~LOC~~+RFID (SPONGE) ×2 IMPLANT
SUT SILK 2 0 (SUTURE)
SUT SILK 2-0 18XBRD TIE 12 (SUTURE) IMPLANT
SUT VIC AB 2-0 CT1 27 (SUTURE)
SUT VIC AB 2-0 CT1 TAPERPNT 27 (SUTURE) IMPLANT
SUT VIC AB 3-0 SH 18 (SUTURE) ×2 IMPLANT
SUT VIC AB 3-0 SH 27 (SUTURE) ×1
SUT VIC AB 3-0 SH 27XBRD (SUTURE) ×1 IMPLANT
SUT VICRYL 4-0 PS2 18IN ABS (SUTURE) ×2 IMPLANT
SYR 10ML LL (SYRINGE) ×2 IMPLANT
TOWEL GREEN STERILE (TOWEL DISPOSABLE) ×2 IMPLANT
TOWEL GREEN STERILE FF (TOWEL DISPOSABLE) ×2 IMPLANT
WATER STERILE IRR 1000ML POUR (IV SOLUTION) ×2 IMPLANT

## 2021-07-11 NOTE — Plan of Care (Signed)
  Problem: Education: Goal: Knowledge of General Education information will improve Description: Including pain rating scale, medication(s)/side effects and non-pharmacologic comfort measures Outcome: Progressing   Problem: Health Behavior/Discharge Planning: Goal: Ability to manage health-related needs will improve Outcome: Progressing   Problem: Clinical Measurements: Goal: Will remain free from infection Outcome: Progressing   Problem: Nutrition: Goal: Adequate nutrition will be maintained Outcome: Progressing   Problem: Pain Managment: Goal: General experience of comfort will improve Outcome: Progressing   Problem: Skin Integrity: Goal: Risk for impaired skin integrity will decrease Outcome: Progressing   

## 2021-07-11 NOTE — Transfer of Care (Signed)
Immediate Anesthesia Transfer of Care Note  Patient: Zachary Weeks  Procedure(s) Performed: MEDIASTINOSCOPY (Neck)  Patient Location: PACU  Anesthesia Type:General  Level of Consciousness: awake, alert  and oriented  Airway & Oxygen Therapy: Patient Spontanous Breathing and Patient connected to face mask oxygen  Post-op Assessment: Report given to RN and Post -op Vital signs reviewed and stable  Post vital signs: Reviewed and stable  Last Vitals:  Vitals Value Taken Time  BP 115/63 07/11/21 0938  Temp    Pulse 80 07/11/21 0940  Resp 21 07/11/21 0940  SpO2 97 % 07/11/21 0940  Vitals shown include unvalidated device data.  Last Pain:  Vitals:   07/11/21 0647  TempSrc:   PainSc: 0-No pain         Complications: No notable events documented.

## 2021-07-11 NOTE — Interval H&P Note (Signed)
History and Physical Interval Note:  07/11/2021 7:14 AM  Zachary Weeks  has presented today for surgery, with the diagnosis of MEDIASTINAL ADENOPATHY.  The various methods of treatment have been discussed with the patient and family. After consideration of risks, benefits and other options for treatment, the patient has consented to  Procedure(s): MEDIASTINOSCOPY (N/A) as a surgical intervention.  The patient's history has been reviewed, patient examined, no change in status, stable for surgery.  I have reviewed the patient's chart and labs.  Questions were answered to the patient's satisfaction.     Melrose Nakayama

## 2021-07-11 NOTE — Op Note (Signed)
NAME: BROOKSON, MORISSET MEDICAL RECORD NO: [id number] ACCOUNT NO: [id number] DATE OF BIRTH: 1981-06-08 FACILITY: [facility] LOCATION: [facility] PHYSICIAN: Revonda Standard. Roxan Hockey, MD  Operative Report   DATE OF PROCEDURE: 07/11/2021  PREOPERATIVE DIAGNOSIS:  Mediastinal adenopathy.  POSTOPERATIVE DIAGNOSIS:  Mediastinal adenopathy with noncaseating granulomas.  PROCEDURE:  Mediastinoscopy.  SURGEON:  Revonda Standard. Roxan Hockey, MD.  ASSISTANT:  None.  ANESTHESIA:  General.  FINDINGS:  Enlarged, grayish, granular 4R lymph nodes. Frozen section revealed noncaseating granulomas.  CLINICAL NOTE: Mr. Plett is a 40 year old man with a complex medical history and known mediastinal adenopathy dating back about 2 years.  He had an endobronchial ultrasound, which was nondiagnostic. Recently, the nodes have shown a slight increase in  size over time and he was referred for biopsy.  The indications, risks, benefits, and alternatives were discussed in detail with the patient.  He understood and accepted the risks and agreed to proceed.  OPERATIVE NOTE: Mr. Handke was brought to the operating room on 07/11/2021.  He had induction of general anesthesia and was intubated.  Intravenous antibiotics were administered.  Sequential compression devices were placed on the calves for DVT  prophylaxis.  The neck and chest were prepped and draped in the usual sterile fashion.  A timeout was performed.  A transverse incision was made one fingerbreadth above the sternal notch.  The incision was carried through the skin and subcutaneous tissue.  The strap muscles were separated in the midline.  The pretracheal fascia was  identified and incised and the pretracheal plane was developed bluntly into the anterior mediastinum.  The mediastinoscope was inserted and advanced.  This was technically difficult due to the patient's body habitus.  The scope would not quite reach the  subcarinal nodes, but inspection of the right paratracheal  area revealed 2 adjacent level 4R nodes that both were grayish and slightly granular in appearance.  Biopsies were taken from the first node and sent for frozen section.  Additional biopsies were  taken for permanent pathology and then a biopsy was taken for AFB and fungal cultures as well.  Attention then was turned to the second node and the biopsy process was repeated, but sending only for permanent pathology.  There was some mild bleeding,  which was controlled with cautery and then Surgicel was packed into the area.  Scope was reinserted a second time and a second attempt was made to reach the subcarinal nodes, but it was not felt to be safe to attempt a biopsy in that area. The 4R area was reinspected and the Surgicel was removed. There was no significant ongoing bleeding.  The wound was packed with sterile gauze for 5 minutes. The frozen section returned showing noncaseating granulomas.  The mediastinoscope was inserted for a final inspection.   There was no ongoing bleeding.  The mediastinoscope was withdrawn.  The wound was closed in standard fashion.  Dermabond was applied.  The patient was extubated in the operating room and taken to the postanesthetic care unit in good condition.  All  sponge, needle and instrument counts were correct at the end of the procedure.   PAA D: 07/11/2021 9:41:21 am T: 07/11/2021 10:22:00 am  JOB: [id number]

## 2021-07-11 NOTE — Progress Notes (Signed)
Patients home CPAP set up and sterile water added at bedside. Patient able to place self on/off. Advised patient if he needed any further assistance to call RT.

## 2021-07-11 NOTE — Brief Op Note (Signed)
07/11/2021  9:27 AM  PATIENT:  Zachary Weeks  40 y.o. male  PRE-OPERATIVE DIAGNOSIS:  MEDIASTINAL ADENOPATHY  POST-OPERATIVE DIAGNOSIS:  MEDIASTINAL ADENOPATHY- NON-CASEATING GRANULOMAS  PROCEDURE:  Procedure(s): MEDIASTINOSCOPY (N/A)  SURGEON:  Surgeon(s) and Role:    * Melrose Nakayama, MD - Primary  PHYSICIAN ASSISTANT:   ASSISTANTS: none   ANESTHESIA:   general  EBL:  10 mL   BLOOD ADMINISTERED:none  DRAINS: none   LOCAL MEDICATIONS USED:  NONE  SPECIMEN:  Source of Specimen:  mediastinal lymph node  DISPOSITION OF SPECIMEN:   Path and Micro  COUNTS:  YES  TOURNIQUET:  * No tourniquets in log *  DICTATION: .Other Dictation: Dictation Number -  PLAN OF CARE: Admit for overnight observation  PATIENT DISPOSITION:  PACU - hemodynamically stable.   Delay start of Pharmacological VTE agent (>24hrs) due to surgical blood loss or risk of bleeding: yes

## 2021-07-11 NOTE — Discharge Summary (Signed)
Physician Discharge Summary  Patient ID: [name] MRN: [id number] DOB/AGE: 42-23-1982 42 y.o.  Admit date: 07/11/2021 Discharge date: 07/12/2021  Admission Diagnoses:  Patient Active Problem List   Diagnosis Date Noted   Mediastinal adenopathy 07/11/2021   NSVT (nonsustained ventricular tachycardia) ([place]) 06/26/2021   Acute respiratory failure with hypoxia ([place]) 05/30/2021   Coronary artery disease involving native coronary artery of native heart without angina pectoris 05/13/2021   HTN (hypertension) 05/06/2021   Hypoxia 05/06/2021   GERD (gastroesophageal reflux disease) 05/06/2021   Hyperlipidemia 05/06/2021   Hyperglycemia 05/06/2021   NSTEMI (non-ST elevated myocardial infarction) ([place]) 05/05/2021   Hilar adenopathy 10/18/2020   OSA (obstructive sleep apnea) 10/18/2020   CKD (chronic kidney disease) stage 3, GFR 30-59 ml/min ([place]) 10/18/2020   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   Discharge Diagnoses:   Patient Active Problem List   Diagnosis Date Noted   Mediastinal adenopathy 07/11/2021   NSVT (nonsustained ventricular tachycardia) ([place]) 06/26/2021   Acute respiratory failure with hypoxia ([place]) 05/30/2021   Coronary artery disease involving native coronary artery of native heart without angina pectoris 05/13/2021   HTN (hypertension) 05/06/2021   Hypoxia 05/06/2021   GERD (gastroesophageal reflux disease) 05/06/2021   Hyperlipidemia 05/06/2021   Hyperglycemia 05/06/2021   NSTEMI (non-ST elevated myocardial infarction) ([place]) 05/05/2021   Hilar adenopathy 10/18/2020   OSA (obstructive sleep apnea) 10/18/2020   CKD (chronic kidney disease) stage 3, GFR 30-59 ml/min ([place]) 10/18/2020   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   Discharged Condition: good  History of Present Illness:  Zachary Weeks is a 40 year old man with a complicated medical history.  He has a past medical history significant for DVT, reflux, asthma, hypertension, left brachial plexopathy, sleep apnea,  non-ST elevation MI, and mediastinal adenopathy.  He was diagnosed with mediastinal adenopathy back in 2020 around the time he had a deep venous thrombosis.  He had originally presented with left arm weakness and pain.  As far of his work-up he had a CT of the chest which showed mediastinal and hilar adenopathy.  He had an EBUS which showed lymphoid cells but was nondiagnostic.  He was followed and the nodes did not change significantly over time initially.   In June he noticed that his heart was racing and his oxygen saturations were low he went to the hospital.  He ruled in for non-ST elevation MI.  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  He was treated with aspirin and Brilinta.  Also during that admission he had a CT of the chest which showed lymph nodes had enlarged slightly compared to December 2020.    In July he had another CT which showed persistent adenopathy.  He continues to have shortness of breath both at rest and with exertion.  He does have sleep apnea and uses CPAP at night.  He is not having any chest pain, pressure, or tightness.  However he did not have those symptoms when he had his non-STEMI, he has lost about 21 pounds over the past 3 months but says that has been intentional.  He was evaluated by Dr. Roxan Hockey who felt mediastinoscopy would be indicated for definitive tissue diagnosis.  The risks and benefits of the procedure were explained to the patient and he was agreeable to proceed.  Hospital Course:  The patient presented to [facility] on 07/11/2021.  He was taken to the operating room and underwent Mediastinoscopy.  He tolerated the procedure without difficulty was extubated and  taken to the recovery unit in stable condition.  Due to the patient's multiple medical conditions he was kept overnight for observation.  Pending he remains clinically stable he will be discharged on the morning of  07/12/2021.  Treatments:   Operative Report    DATE OF PROCEDURE: 07/11/2021   PREOPERATIVE DIAGNOSIS:  Mediastinal adenopathy.   POSTOPERATIVE DIAGNOSIS:  Mediastinal adenopathy with noncaseating granulomas.   PROCEDURE:  Mediastinoscopy.   SURGEON:  Revonda Standard. Roxan Hockey, MD.  Discharge Exam: Blood pressure 123/83, pulse 76, temperature (!) 97.5 F (36.4 C), temperature source Oral, resp. rate 15, height 6' (1.829 m), weight (!) 145.6 kg, SpO2 97 %. General appearance: alert, cooperative, and no distress Neck: wound clean and dry  Disposition:    home   Signed: Melrose Nakayama, MD  07/12/2021, 9:15 AM

## 2021-07-11 NOTE — Anesthesia Procedure Notes (Signed)
Procedure Name: Intubation Date/Time: 07/11/2021 7:49 AM Performed by: Bryson Corona, CRNA Pre-anesthesia Checklist: Patient identified, Emergency Drugs available, Suction available and Patient being monitored Patient Re-evaluated:Patient Re-evaluated prior to induction Oxygen Delivery Method: Circle System Utilized Preoxygenation: Pre-oxygenation with 100% oxygen Induction Type: IV induction Ventilation: Oral airway inserted - appropriate to patient size and Two handed mask ventilation required Laryngoscope Size: Glidescope and 4 Grade View: Grade I Tube type: Oral Tube size: 7.5 mm Number of attempts: 1 Airway Equipment and Method: Stylet and Oral airway Placement Confirmation: ETT inserted through vocal cords under direct vision, positive ETCO2 and breath sounds checked- equal and bilateral Secured at: 23 cm Tube secured with: Tape Dental Injury: Teeth and Oropharynx as per pre-operative assessment  Difficulty Due To: Difficulty was anticipated Future Recommendations: Recommend- induction with short-acting agent, and alternative techniques readily available Comments: Elective glidescope intubation

## 2021-07-12 DIAGNOSIS — I898 Other specified noninfective disorders of lymphatic vessels and lymph nodes: Secondary | ICD-10-CM | POA: Diagnosis not present

## 2021-07-12 MED ORDER — OXYCODONE HCL 5 MG PO TABS: 5.0000 mg | ORAL_TABLET | Freq: Four times a day (QID) | ORAL | Status: DC | PRN

## 2021-07-12 MED ORDER — ACETAMINOPHEN 500 MG PO TABS: 1000.0000 mg | ORAL_TABLET | Freq: Four times a day (QID) | ORAL | 0 refills | Status: DC

## 2021-07-12 MED ORDER — OXYCODONE HCL 5 MG PO TABS: 5.0000 mg | ORAL_TABLET | Freq: Four times a day (QID) | ORAL | 0 refills | Status: AC | PRN

## 2021-07-12 MED ORDER — ACETAMINOPHEN 500 MG PO TABS
1000.0000 mg | ORAL_TABLET | Freq: Four times a day (QID) | ORAL | Status: DC
  Administered 2021-07-12: 1000 mg via ORAL
  Filled 2021-07-12: qty 2

## 2021-07-12 NOTE — Progress Notes (Signed)
1 Day Post-Op Procedure(s) (LRB): MEDIASTINOSCOPY (N/A) Subjective: Some incisional pain this AM  Objective: Vital signs in last 24 hours: Temp:  [97.5 F (36.4 C)-98.3 F (36.8 C)] 97.5 F (36.4 C) (08/20 0727) Pulse Rate:  [60-92] 76 (08/20 0727) Cardiac Rhythm: Normal sinus rhythm (08/20 0730) Resp:  [13-18] 15 (08/20 0727) BP: (98-123)/(58-85) 123/83 (08/20 0727) SpO2:  [94 %-97 %] 97 % (08/20 0727)  Hemodynamic parameters for last 24 hours:    Intake/Output from previous day: 08/19 0701 - 08/20 0700 In: 1860 [P.O.:360; I.V.:1500] Out: 10 [Blood:10] Intake/Output this shift: Total I/O In: 240 [P.O.:240] Out: 200 [Urine:200]  General appearance: alert, cooperative, and no distress Wound: clean and dry  Lab Results: Recent Labs    07/09/21 1325  WBC 7.7  HGB 15.9  HCT 47.5  PLT 189   BMET:  Recent Labs    07/09/21 1325  NA 136  K 3.7  CL 104  CO2 24  GLUCOSE 165*  BUN 17  CREATININE 1.28*  CALCIUM 9.1    PT/INR:  Recent Labs    07/09/21 1325  LABPROT 13.4  INR 1.0   ABG    Component Value Date/Time   PHART 7.400 05/06/2021 1650   HCO3 25.5 05/06/2021 1650   O2SAT 94.6 05/06/2021 1650   CBG (last 3)  No results for input(s): GLUCAP in the last 72 hours.  Assessment/Plan: S/P Procedure(s) (LRB): MEDIASTINOSCOPY (N/A) Plan for discharge: see discharge orders Will plan follow up in about a week Instructions given   LOS: 0 days    Melrose Nakayama 07/12/2021

## 2021-07-12 NOTE — Plan of Care (Signed)
  Problem: Education: Goal: Knowledge of General Education information will improve Description: Including pain rating scale, medication(s)/side effects and non-pharmacologic comfort measures 07/12/2021 1154 by Neil Crouch, RN Outcome: Completed/Met 07/12/2021 0916 by Neil Crouch, RN Outcome: Adequate for Discharge   Problem: Health Behavior/Discharge Planning: Goal: Ability to manage health-related needs will improve 07/12/2021 1154 by Neil Crouch, RN Outcome: Completed/Met 07/12/2021 0916 by Neil Crouch, RN Outcome: Adequate for Discharge   Problem: Clinical Measurements: Goal: Ability to maintain clinical measurements within normal limits will improve 07/12/2021 1154 by Neil Crouch, RN Outcome: Completed/Met 07/12/2021 0916 by Neil Crouch, RN Outcome: Adequate for Discharge Goal: Will remain free from infection 07/12/2021 1154 by Neil Crouch, RN Outcome: Completed/Met 07/12/2021 0916 by Neil Crouch, RN Outcome: Adequate for Discharge Goal: Diagnostic test results will improve 07/12/2021 1154 by Neil Crouch, RN Outcome: Completed/Met 07/12/2021 0916 by Neil Crouch, RN Outcome: Adequate for Discharge Goal: Respiratory complications will improve 07/12/2021 1154 by Neil Crouch, RN Outcome: Completed/Met 07/12/2021 0916 by Neil Crouch, RN Outcome: Adequate for Discharge Goal: Cardiovascular complication will be avoided 07/12/2021 1154 by Neil Crouch, RN Outcome: Completed/Met 07/12/2021 0916 by Neil Crouch, RN Outcome: Adequate for Discharge   Problem: Activity: Goal: Risk for activity intolerance will decrease 07/12/2021 1154 by Neil Crouch, RN Outcome: Completed/Met 07/12/2021 0916 by Neil Crouch, RN Outcome: Adequate for Discharge   Problem: Nutrition: Goal: Adequate nutrition will be maintained 07/12/2021 1154 by Neil Crouch, RN Outcome: Completed/Met 07/12/2021 0918 by Neil Crouch, RN Outcome: Completed/Met 07/12/2021 0916 by Neil Crouch, RN Outcome:  Adequate for Discharge   Problem: Coping: Goal: Level of anxiety will decrease 07/12/2021 1154 by Neil Crouch, RN Outcome: Completed/Met 07/12/2021 0916 by Neil Crouch, RN Outcome: Adequate for Discharge   Problem: Elimination: Goal: Will not experience complications related to bowel motility 07/12/2021 1154 by Neil Crouch, RN Outcome: Completed/Met 07/12/2021 0916 by Neil Crouch, RN Outcome: Adequate for Discharge Goal: Will not experience complications related to urinary retention 07/12/2021 1154 by Neil Crouch, RN Outcome: Completed/Met 07/12/2021 0916 by Neil Crouch, RN Outcome: Adequate for Discharge   Problem: Pain Managment: Goal: General experience of comfort will improve 07/12/2021 1154 by Neil Crouch, RN Outcome: Completed/Met 07/12/2021 0916 by Neil Crouch, RN Outcome: Adequate for Discharge   Problem: Safety: Goal: Ability to remain free from injury will improve 07/12/2021 1154 by Neil Crouch, RN Outcome: Completed/Met 07/12/2021 0916 by Neil Crouch, RN Outcome: Adequate for Discharge   Problem: Skin Integrity: Goal: Risk for impaired skin integrity will decrease 07/12/2021 1154 by Neil Crouch, RN Outcome: Completed/Met 07/12/2021 0916 by Neil Crouch, RN Outcome: Adequate for Discharge

## 2021-07-12 NOTE — Plan of Care (Signed)

## 2021-07-12 NOTE — Plan of Care (Addendum)
Pt ambulated in a room and hallway without any difficulty in breathing, denies chest pain. Looking forward to be discharged this morning  Palma Holter, RN

## 2021-07-12 NOTE — Plan of Care (Addendum)
Pt got discharged to home, discharge instructions provided and patient showed understanding to it, IV taken out,Telemonitor DC,pt left unit in wheelchair with all of the belongings accompanied with a family member.  Mykalah Saari, RN 

## 2021-07-13 LAB — ACID FAST SMEAR (AFB, MYCOBACTERIA): Acid Fast Smear: NEGATIVE

## 2021-07-14 ENCOUNTER — Encounter (HOSPITAL_COMMUNITY): Payer: Managed Care, Other (non HMO)

## 2021-07-14 ENCOUNTER — Ambulatory Visit: Payer: Managed Care, Other (non HMO) | Admitting: Cardiology

## 2021-07-14 ENCOUNTER — Encounter (HOSPITAL_COMMUNITY): Payer: Self-pay | Admitting: Thoracic Surgery (Cardiothoracic Vascular Surgery)

## 2021-07-14 NOTE — Anesthesia Postprocedure Evaluation (Signed)
Anesthesia Post Note  Patient: Zachary Weeks  Procedure(s) Performed: MEDIASTINOSCOPY (Neck)     Patient location during evaluation: PACU Anesthesia Type: General Level of consciousness: awake and alert Pain management: pain level controlled Vital Signs Assessment: post-procedure vital signs reviewed and stable Respiratory status: spontaneous breathing, nonlabored ventilation, respiratory function stable and patient connected to nasal cannula oxygen Cardiovascular status: blood pressure returned to baseline and stable Postop Assessment: no apparent nausea or vomiting Anesthetic complications: no   No notable events documented.  Last Vitals:  Vitals:   07/12/21 0727 07/12/21 1100  BP: 123/83 120/80  Pulse: 76 80  Resp: 15 16  Temp: (!) 36.4 C 36.7 C  SpO2: 97% 97%    Last Pain:  Vitals:   07/12/21 1100  TempSrc: Oral  PainSc: 0-No pain                 Tiajuana Amass

## 2021-07-15 LAB — SURGICAL PATHOLOGY

## 2021-07-16 ENCOUNTER — Other Ambulatory Visit: Payer: Self-pay

## 2021-07-16 ENCOUNTER — Encounter (HOSPITAL_COMMUNITY)
Admission: RE | Admit: 2021-07-16 | Discharge: 2021-07-16 | Disposition: A | Discharge status: Home / Self Care | Payer: Managed Care, Other (non HMO) | Source: Ambulatory Visit | Attending: Cardiology | Admitting: Cardiology

## 2021-07-16 DIAGNOSIS — Z5189 Encounter for other specified aftercare: Secondary | ICD-10-CM | POA: Diagnosis not present

## 2021-07-16 DIAGNOSIS — I214 Non-ST elevation (NSTEMI) myocardial infarction: Secondary | ICD-10-CM

## 2021-07-16 NOTE — Progress Notes (Signed)
Daily Session Note  Patient Details  Name: Zachary Weeks MRN: [id number] Date of Birth: Jun 16, 1981 Referring Provider:   Flowsheet Row CARDIAC REHAB PHASE II ORIENTATION from 05/30/2021 in [place]  Referring Provider Dr. Grandville Silos       Encounter Date: 07/16/2021  Check In:  Session Check In - 07/16/21 0815       Check-In   Supervising physician immediately available to respond to emergencies [facility] MD immediately available    Physician(s) Dr. Harl Bowie    Location [facility]    Staff Present Aundra Dubin, RN, BSN;Other   Benay Pillow   Virtual Visit No    Medication changes reported     No    Fall or balance concerns reported    No    Tobacco Cessation No Change    Warm-up and Cool-down Performed as group-led instruction    Resistance Training Performed Yes    VAD Patient? No    PAD/SET Patient? No      Pain Assessment   Currently in Pain? No/denies    Pain Score 0-No pain    Multiple Pain Sites No             Capillary Blood Glucose: No results found for this or any previous visit (from the past 24 hour(s)).    Social History   Tobacco Use  Smoking Status Former   Packs/day: 1.00   Years: 3.00   Pack years: 3.00   Types: Cigarettes   Quit date: 1999   Years since quitting: 23.6  Smokeless Tobacco Current   Types: Chew, Snuff    Goals Met:  Independence with exercise equipment Exercise tolerated well No report of cardiac concerns or symptoms Strength training completed today  Goals Unmet:  Not Applicable  Comments: Check out 915.   Dr. Kathie Dike is Medical Director for [facility].

## 2021-07-18 ENCOUNTER — Encounter (HOSPITAL_COMMUNITY): Payer: Managed Care, Other (non HMO)

## 2021-07-21 ENCOUNTER — Other Ambulatory Visit: Payer: Self-pay

## 2021-07-21 ENCOUNTER — Other Ambulatory Visit: Payer: Self-pay | Admitting: Thoracic Surgery (Cardiothoracic Vascular Surgery)

## 2021-07-21 ENCOUNTER — Encounter (HOSPITAL_COMMUNITY)
Admission: RE | Admit: 2021-07-21 | Discharge: 2021-07-21 | Disposition: A | Discharge status: Home / Self Care | Payer: Managed Care, Other (non HMO) | Source: Ambulatory Visit | Attending: Cardiology | Admitting: Cardiology

## 2021-07-21 VITALS — Wt 316.6 lb

## 2021-07-21 DIAGNOSIS — I214 Non-ST elevation (NSTEMI) myocardial infarction: Secondary | ICD-10-CM

## 2021-07-21 DIAGNOSIS — Z5189 Encounter for other specified aftercare: Secondary | ICD-10-CM | POA: Diagnosis not present

## 2021-07-21 DIAGNOSIS — R590 Localized enlarged lymph nodes: Secondary | ICD-10-CM

## 2021-07-21 NOTE — Progress Notes (Signed)
Daily Session Note  Patient Details  Name: Zachary Weeks MRN: [id number] Date of Birth: [birth year] Referring Provider:   Flowsheet Row CARDIAC REHAB PHASE II ORIENTATION from 05/30/2021 in [place]  Referring Provider Dr. Grandville Silos       Encounter Date: 07/21/2021  Check In:  Session Check In - 07/21/21 0815       Check-In   Supervising physician immediately available to respond to emergencies [facility] MD immediately available    Physician(s) Dr. Domenic Polite    Location [facility]    Staff Present Geanie Cooley, RN;Debra Wynetta Emery, RN, BSN    Virtual Visit No    Medication changes reported     No    Fall or balance concerns reported    No    Tobacco Cessation No Change    Warm-up and Cool-down Performed as group-led instruction    Resistance Training Performed Yes    VAD Patient? No    PAD/SET Patient? No      Pain Assessment   Currently in Pain? No/denies    Pain Score 0-No pain    Multiple Pain Sites No             Capillary Blood Glucose: No results found for this or any previous visit (from the past 24 hour(s)).    Social History   Tobacco Use  Smoking Status Former   Packs/day: 1.00   Years: 3.00   Pack years: 3.00   Types: Cigarettes   Quit date: 1999   Years since quitting: 23.6  Smokeless Tobacco Current   Types: Chew, Snuff    Goals Met:  Independence with exercise equipment Exercise tolerated well No report of concerns or symptoms today Strength training completed today  Goals Unmet:  Not Applicable  Comments: check out @ 9:15   Dr. Kathie Dike is Medical Director for [facility].

## 2021-07-22 ENCOUNTER — Ambulatory Visit: Payer: Managed Care, Other (non HMO) | Admitting: Thoracic Surgery (Cardiothoracic Vascular Surgery)

## 2021-07-22 ENCOUNTER — Encounter: Payer: Self-pay | Admitting: Thoracic Surgery (Cardiothoracic Vascular Surgery)

## 2021-07-22 ENCOUNTER — Ambulatory Visit
Admission: RE | Admit: 2021-07-22 | Discharge: 2021-07-22 | Disposition: A | Discharge status: Home / Self Care | Payer: Managed Care, Other (non HMO) | Source: Ambulatory Visit | Attending: Thoracic Surgery (Cardiothoracic Vascular Surgery) | Admitting: Thoracic Surgery (Cardiothoracic Vascular Surgery)

## 2021-07-22 VITALS — BP 111/78 | HR 81 | Resp 20 | Ht 72.0 in | Wt 315.0 lb

## 2021-07-22 DIAGNOSIS — R590 Localized enlarged lymph nodes: Secondary | ICD-10-CM

## 2021-07-22 DIAGNOSIS — Z09 Encounter for follow-up examination after completed treatment for conditions other than malignant neoplasm: Secondary | ICD-10-CM | POA: Diagnosis not present

## 2021-07-22 NOTE — Progress Notes (Signed)
[address]       [place] [address]             [phone]       HPI: Mr. Merrifield returns for a scheduled postoperative follow-up visit  Glade San is a 40 year old man with a history of DVT, reflux, asthma, hypertension, left brachial plexopathy, sleep apnea, non-ST elevation MI, and mediastinal adenopathy.  He was found to have mediastinal adenopathy in 2020.  He had an EBUS at one time which showed lymphoid cells but was nondiagnostic.  He had a non-ST elevation MI in June.  Catheterization did not show any hemodynamically significant stenoses.  A CT of the chest did show his lymph nodes had enlarged slightly in comparison to December 2020.  I did a mediastinoscopy on 07/12/2019.  Pathology showed noncaseating granulomas.  AFB and fungal stains were negative.  Cultures are still pending.  We did observe him overnight due to his complicated medical history.  He went home the next day.  He still has some soreness.  He is not taking any narcotics at this point.  Past Medical History:  Diagnosis Date   Asthma    DVT (deep venous thrombosis) (HCC)    Dyspnea    at times - no known reason.    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   Myocardial infarction [facility])    Pneumonia    33- 53 years of age   Sleep apnea    CPAP @ HS    Current Outpatient Medications  Medication Sig Dispense Refill   acetaminophen (TYLENOL) 500 MG tablet Take 2 tablets (1,000 mg total) by mouth 4 (four) times daily. (Patient taking differently: Take 1,000 mg by mouth every 8 (eight) hours as needed.) 30 tablet 0   albuterol (VENTOLIN HFA) 108 (90 Base) MCG/ACT inhaler Inhale 1-2 puffs into the lungs every 6 (six) hours as needed for wheezing or shortness of breath.     ASPIRIN LOW DOSE 81 MG EC tablet TAKE 1 TABLET BY MOUTH DAILY swallow whole 30 tablet 0   atorvastatin (LIPITOR) 40 MG tablet Take  80 mg by mouth daily.     BRILINTA 90 MG TABS tablet TAKE 1 TABLET BY MOUTH TWICE DAILY 60 tablet 0   cholecalciferol (VITAMIN D) 25 MCG (1000 UNIT) tablet Take 1,000 Units by mouth daily.     cyclobenzaprine (FLEXERIL) 10 MG tablet Take 10 mg by mouth 3 (three) times daily as needed for muscle spasms.     metoprolol tartrate (LOPRESSOR) 25 MG tablet Take 1.5 tablets (37.5 mg total) by mouth 2 (two) times daily. 180 tablet 3   nitroGLYCERIN (NITROSTAT) 0.4 MG SL tablet Place 1 tablet (0.4 mg total) under the tongue every 5 (five) minutes as needed for chest pain. 30 tablet 1   pantoprazole (PROTONIX) 40 MG tablet Take 40 mg by mouth in the morning.     amLODipine (NORVASC) 5 MG tablet Take 1 tablet (5 mg total) by mouth daily. 30 tablet 2   No current facility-administered medications for this visit.    Physical Exam BP 111/78   Pulse 81   Resp 20   Ht 6' (1.829 m)   Wt (!) 315 lb (142.9 kg)   SpO2 92% Comment: RA  BMI 42.5 kg/m  40 year old man in no acute distress Alert and oriented x3 Wound clean dry  and intact  Diagnostic Tests: I personally reviewed his chest x-ray.  No active disease.  Impression: Nesbitt Gandia is a 40 year old man with a complex medical history including mediastinal adenopathy that has been present for couple of years.  He recently had a CT which showed slight increase in size of his lymph nodes.  I did a mediastinoscopy on 07/11/2021.  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  AFB and fungal stains were negative but cultures are still pending.  From a surgical standpoint he is doing fine.  His incision looks good.  He had some pain for the first couple days but that has improved dramatically.  He has an appointment with Dr. Elsworth Soho in about 10 days to discuss treatment.  Plan: Follow-up with Dr. Elsworth Soho as scheduled on 08/01/2021 Okay to return to work from surgical standpoint.  Melrose Nakayama, MD [facility] [phone]

## 2021-07-23 ENCOUNTER — Other Ambulatory Visit: Payer: Self-pay

## 2021-07-23 ENCOUNTER — Encounter (HOSPITAL_COMMUNITY)
Admission: RE | Admit: 2021-07-23 | Discharge: 2021-07-23 | Disposition: A | Discharge status: Home / Self Care | Payer: Managed Care, Other (non HMO) | Source: Ambulatory Visit | Attending: Cardiology | Admitting: Cardiology

## 2021-07-23 ENCOUNTER — Other Ambulatory Visit: Payer: Self-pay | Admitting: Cardiology

## 2021-07-23 DIAGNOSIS — Z5189 Encounter for other specified aftercare: Secondary | ICD-10-CM | POA: Diagnosis not present

## 2021-07-23 DIAGNOSIS — I214 Non-ST elevation (NSTEMI) myocardial infarction: Secondary | ICD-10-CM

## 2021-07-23 NOTE — Progress Notes (Signed)
Daily Session Note  Patient Details  Name: Zachary Weeks MRN: [id number] Date of Birth: 1980-12-09 Referring Provider:   Flowsheet Row CARDIAC REHAB PHASE II ORIENTATION from 05/30/2021 in [place]  Referring Provider Dr. Grandville Silos       Encounter Date: 07/23/2021  Check In:  Session Check In - 07/23/21 0815       Check-In   Supervising physician immediately available to respond to emergencies [facility] MD immediately available    Physician(s) Dr. Domenic Polite    Location [facility]    Staff Present Hoy Register, MS, ACSM-CEP, Exercise Physiologist;Debra Wynetta Emery, RN, BSN    Virtual Visit No    Medication changes reported     No    Fall or balance concerns reported    No    Tobacco Cessation No Change    Warm-up and Cool-down Performed as group-led instruction    Resistance Training Performed Yes    VAD Patient? No    PAD/SET Patient? No      Pain Assessment   Currently in Pain? No/denies    Pain Score 0-No pain    Multiple Pain Sites No             Capillary Blood Glucose: No results found for this or any previous visit (from the past 24 hour(s)).    Social History   Tobacco Use  Smoking Status Former   Packs/day: 1.00   Years: 3.00   Pack years: 3.00   Types: Cigarettes   Quit date: 1999   Years since quitting: 23.6  Smokeless Tobacco Current   Types: Chew, Snuff    Goals Met:  Independence with exercise equipment Exercise tolerated well No report of concerns or symptoms today Strength training completed today  Goals Unmet:  Not Applicable  Comments: checkout time is 0915   Dr. Kathie Dike is Medical Director for [facility].

## 2021-07-24 ENCOUNTER — Encounter (HOSPITAL_COMMUNITY): Payer: Self-pay

## 2021-07-24 ENCOUNTER — Emergency Department (HOSPITAL_COMMUNITY)
Admission: EM | Admit: 2021-07-24 | Discharge: 2021-07-24 | Disposition: A | Discharge status: Home / Self Care | Payer: Managed Care, Other (non HMO) | Attending: Student | Admitting: Student

## 2021-07-24 ENCOUNTER — Telehealth: Payer: Self-pay

## 2021-07-24 ENCOUNTER — Other Ambulatory Visit: Payer: Self-pay

## 2021-07-24 ENCOUNTER — Emergency Department (HOSPITAL_COMMUNITY): Payer: Managed Care, Other (non HMO)

## 2021-07-24 DIAGNOSIS — J45909 Unspecified asthma, uncomplicated: Secondary | ICD-10-CM | POA: Diagnosis not present

## 2021-07-24 DIAGNOSIS — I2510 Atherosclerotic heart disease of native coronary artery without angina pectoris: Secondary | ICD-10-CM | POA: Diagnosis not present

## 2021-07-24 DIAGNOSIS — D72829 Elevated white blood cell count, unspecified: Secondary | ICD-10-CM | POA: Diagnosis not present

## 2021-07-24 DIAGNOSIS — I129 Hypertensive chronic kidney disease with stage 1 through stage 4 chronic kidney disease, or unspecified chronic kidney disease: Secondary | ICD-10-CM | POA: Diagnosis not present

## 2021-07-24 DIAGNOSIS — N201 Calculus of ureter: Secondary | ICD-10-CM | POA: Diagnosis not present

## 2021-07-24 DIAGNOSIS — Z7982 Long term (current) use of aspirin: Secondary | ICD-10-CM | POA: Diagnosis not present

## 2021-07-24 DIAGNOSIS — Z87891 Personal history of nicotine dependence: Secondary | ICD-10-CM | POA: Insufficient documentation

## 2021-07-24 DIAGNOSIS — N183 Chronic kidney disease, stage 3 unspecified: Secondary | ICD-10-CM | POA: Diagnosis not present

## 2021-07-24 DIAGNOSIS — Z79899 Other long term (current) drug therapy: Secondary | ICD-10-CM | POA: Diagnosis not present

## 2021-07-24 DIAGNOSIS — R1032 Left lower quadrant pain: Secondary | ICD-10-CM | POA: Diagnosis present

## 2021-07-24 LAB — COMPREHENSIVE METABOLIC PANEL
ALT: 28 U/L (ref 0–44)
AST: 22 U/L (ref 15–41)
Albumin: 4.2 g/dL (ref 3.5–5.0)
Alkaline Phosphatase: 83 U/L (ref 38–126)
Anion gap: 8 (ref 5–15)
BUN: 15 mg/dL (ref 6–20)
CO2: 26 mmol/L (ref 22–32)
Calcium: 9 mg/dL (ref 8.9–10.3)
Chloride: 102 mmol/L (ref 98–111)
Creatinine, Ser: 1.57 mg/dL — ABNORMAL HIGH (ref 0.61–1.24)
GFR, Estimated: 56 mL/min — ABNORMAL LOW (ref >60)
Glucose, Bld: 136 mg/dL — ABNORMAL HIGH (ref 70–99)
Potassium: 3.8 mmol/L (ref 3.5–5.1)
Sodium: 136 mmol/L (ref 135–145)
Total Bilirubin: 1 mg/dL (ref 0.3–1.2)
Total Protein: 7.6 g/dL (ref 6.5–8.1)

## 2021-07-24 LAB — URINALYSIS, ROUTINE W REFLEX MICROSCOPIC
Bacteria, UA: NONE SEEN
Bilirubin Urine: NEGATIVE
Glucose, UA: 50 mg/dL — AB
Ketones, ur: 5 mg/dL — AB
Leukocytes,Ua: NEGATIVE
Nitrite: NEGATIVE
Protein, ur: 100 mg/dL — AB
RBC / HPF: >50 RBC/hpf — ABNORMAL HIGH (ref 0–5)
Specific Gravity, Urine: 1.016 (ref 1.005–1.030)
pH: 6 (ref 5.0–8.0)

## 2021-07-24 LAB — CBC
HCT: 48.4 % (ref 39.0–52.0)
Hemoglobin: 16.3 g/dL (ref 13.0–17.0)
MCH: 29.2 pg (ref 26.0–34.0)
MCHC: 33.7 g/dL (ref 30.0–36.0)
MCV: 86.6 fL (ref 80.0–100.0)
Platelets: 239 10*3/uL (ref 150–400)
RBC: 5.59 MIL/uL (ref 4.22–5.81)
RDW: 12.7 % (ref 11.5–15.5)
WBC: 12.5 10*3/uL — ABNORMAL HIGH (ref 4.0–10.5)
nRBC: 0 % (ref 0.0–0.2)

## 2021-07-24 LAB — LIPASE, BLOOD: Lipase: 31 U/L (ref 11–51)

## 2021-07-24 MED ORDER — OXYCODONE-ACETAMINOPHEN 5-325 MG PO TABS
1.0000 | ORAL_TABLET | Freq: Once | ORAL | Status: AC
  Administered 2021-07-24: 1 via ORAL
  Filled 2021-07-24: qty 1

## 2021-07-24 MED ORDER — TAMSULOSIN HCL 0.4 MG PO CAPS: 0.4000 mg | ORAL_CAPSULE | Freq: Every day | ORAL | 0 refills | Status: DC

## 2021-07-24 MED ORDER — ONDANSETRON HCL 4 MG/2ML IJ SOLN
4.0000 mg | Freq: Once | INTRAMUSCULAR | Status: AC
  Administered 2021-07-24: 4 mg via INTRAVENOUS
  Filled 2021-07-24: qty 2

## 2021-07-24 MED ORDER — OXYCODONE-ACETAMINOPHEN 5-325 MG PO TABS: 1.0000 | ORAL_TABLET | Freq: Four times a day (QID) | ORAL | 0 refills | Status: DC | PRN

## 2021-07-24 MED ORDER — HYDROMORPHONE HCL 1 MG/ML IJ SOLN
1.0000 mg | Freq: Once | INTRAMUSCULAR | Status: AC
  Administered 2021-07-24: 1 mg via INTRAVENOUS
  Filled 2021-07-24: qty 1

## 2021-07-24 MED ORDER — TAMSULOSIN HCL 0.4 MG PO CAPS
0.4000 mg | ORAL_CAPSULE | Freq: Every day | ORAL | Status: DC
  Administered 2021-07-24: 0.4 mg via ORAL
  Filled 2021-07-24: qty 1

## 2021-07-24 NOTE — ED Notes (Signed)
Pt transported for CT 

## 2021-07-24 NOTE — Telephone Encounter (Signed)
Patient called and stated he went to get a refill on his blood thinners and they stated he has no more refills left. Patient stated that he is not sure if he is still suppose to be taking them or not. Please advise.

## 2021-07-24 NOTE — Discharge Instructions (Addendum)
Your CT scan this evening shows that you have a 3 mm left-sided kidney stone.  This is the likely source of your pain.  Your urine did not show evidence of infection.  Take the pain medication as directed.  The tamsulosin will help to pass the stone easier.  I also recommend that you call Dr. Ralene Muskrat office tomorrow to arrange a follow-up appointment.  Return to the emergency department for any new or worsening symptoms.

## 2021-07-24 NOTE — ED Triage Notes (Signed)
Pt presents to ED with complaints of left sided flank pain and hematuria started this am.

## 2021-07-24 NOTE — ED Provider Notes (Signed)
[facility] EMERGENCY DEPARTMENT Provider Note   CSN: [id number] Arrival date & time: 07/24/21  1602     History Chief Complaint  Patient presents with   Flank Pain    Zachary Weeks is a 40 y.o. male.   Flank Pain Associated symptoms include abdominal pain. Pertinent negatives include no chest pain, no headaches and no shortness of breath.       Zachary Weeks is a 40 y.o. male who presents to the Emergency Department with past medical history of asthma, DVT, stage III CKD, kidney stones, hypertension, MI in June 2022 and currently anticoagulated on Brilinta.  He is complaining of left-sided flank and left lower abdominal pain and hematuria.  Symptoms began around 9 AM this morning.  Gradually worsening.  Pain is been associated with nausea, no vomiting.  Pain does not radiate into his groin or testicles.  States his current pain does not feel similar to previous kidney stones.  He notes that his urine was very dark and bloody this morning.  He denies any chest pain, shortness of breath, diarrhea fever or chills.    Past Medical History:  Diagnosis Date   Asthma    DVT (deep venous thrombosis) (HCC)    Dyspnea    at times - no known reason.    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   Myocardial infarction [facility])    Pneumonia    66- 43 years of age   Sleep apnea    CPAP @ HS    Patient Active Problem List   Diagnosis Date Noted   Mediastinal adenopathy 07/11/2021   NSVT (nonsustained ventricular tachycardia) ([place]) 06/26/2021   Acute respiratory failure with hypoxia ([place]) 05/30/2021   Coronary artery disease involving native coronary artery of native heart without angina pectoris 05/13/2021   HTN (hypertension) 05/06/2021   Hypoxia 05/06/2021   GERD (gastroesophageal reflux disease) 05/06/2021   Hyperlipidemia 05/06/2021   Hyperglycemia 05/06/2021   NSTEMI (non-ST elevated  myocardial infarction) ([place]) 05/05/2021   Hilar adenopathy 10/18/2020   OSA (obstructive sleep apnea) 10/18/2020   CKD (chronic kidney disease) stage 3, GFR 30-59 ml/min ([place]) 10/18/2020   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;  Service: Cardiopulmonary;;   CARDIAC CATHETERIZATION     INTRAVASCULAR ULTRASOUND/IVUS N/A 05/05/2021   Procedure: Intravascular Ultrasound/IVUS;  Surgeon: Nigel Mormon, MD;  Location: [place] [facility];  Service: Cardiovascular;  Laterality: N/A;   LEFT HEART CATH AND CORONARY ANGIOGRAPHY N/A 05/05/2021   Procedure: LEFT HEART CATH AND CORONARY ANGIOGRAPHY;  Surgeon: Nigel Mormon, MD;  Location: [place] [facility];  Service: Cardiovascular;  Laterality: N/A;   MEDIASTINOSCOPY N/A 07/11/2021   Procedure: MEDIASTINOSCOPY;  Surgeon: Melrose Nakayama, MD;  Location: [place];  Service: Thoracic;  Laterality: N/A;   NO PAST SURGERIES     VIDEO BRONCHOSCOPY N/A 11/01/2020   Procedure: VIDEO BRONCHOSCOPY WITH ENDOBRONCHIAL ULTRASOUND;  Surgeon: Rigoberto Noel, MD;  Location: [place];  Service: Cardiopulmonary;  Laterality: N/A;       Family History  Problem Relation  Age of Onset   Pulmonary fibrosis Mother    Cancer Mother    Cancer Father        prostate   Hypertension Father    Fibromyalgia Maternal Aunt    Lupus Maternal Grandmother    Other Maternal Grandmother        clotting disorder   Other Other        clotting disorder    Social History   Tobacco Use   Smoking status: Former    Packs/day: 1.00    Years: 3.00    Pack years: 3.00    Types: Cigarettes    Quit date: 1999     Years since quitting: 23.6   Smokeless tobacco: Current    Types: Chew, Snuff  Vaping Use   Vaping Use: Never used  Substance Use Topics   Alcohol use: Yes    Comment: rarely- maybe 2 mixed drinks in a year   Drug use: Never    Home Medications Prior to Admission medications   Medication Sig Start Date End Date Taking? Authorizing Provider  acetaminophen (TYLENOL) 500 MG tablet Take 2 tablets (1,000 mg total) by mouth 4 (four) times daily. Patient taking differently: Take 1,000 mg by mouth every 8 (eight) hours as needed. 07/12/21   Elgie Collard, PA-C  albuterol (VENTOLIN HFA) 108 (90 Base) MCG/ACT inhaler Inhale 1-2 puffs into the lungs every 6 (six) hours as needed for wheezing or shortness of breath.    [provider]  amLODipine (NORVASC) 5 MG tablet Take 1 tablet (5 mg total) by mouth daily. 06/13/21 07/13/21  Patwardhan, Reynold Bowen, MD  ASPIRIN LOW DOSE 81 MG EC tablet TAKE 1 TABLET BY MOUTH DAILY swallow whole 07/23/21   Patwardhan, Manish J, MD  atorvastatin (LIPITOR) 40 MG tablet Take 80 mg by mouth daily.    [provider]  BRILINTA 90 MG TABS tablet TAKE 1 TABLET BY MOUTH TWICE DAILY 07/03/21   Patwardhan, Manish J, MD  cholecalciferol (VITAMIN D) 25 MCG (1000 UNIT) tablet Take 1,000 Units by mouth daily. 01/17/21   [provider]  cyclobenzaprine (FLEXERIL) 10 MG tablet Take 10 mg by mouth 3 (three) times daily as needed for muscle spasms. 04/26/21   [provider]  metoprolol tartrate (LOPRESSOR) 25 MG tablet Take 1.5 tablets (37.5 mg total) by mouth 2 (two) times daily. 06/26/21   Patwardhan, Reynold Bowen, MD  nitroGLYCERIN (NITROSTAT) 0.4 MG SL tablet Place 1 tablet (0.4 mg total) under the tongue every 5 (five) minutes as needed for chest pain. 05/07/21   Patwardhan, Reynold Bowen, MD  pantoprazole (PROTONIX) 40 MG tablet Take 40 mg by mouth in the morning.    [provider]    Allergies    Patient has no known allergies.  Review of Systems    Review of Systems  Constitutional:  Negative for chills, fatigue and fever.  Respiratory:  Negative for cough, chest tightness, shortness of breath and wheezing.   Cardiovascular:  Negative for chest pain and palpitations.  Gastrointestinal:  Positive for abdominal pain and nausea. Negative for blood in stool and vomiting.  Genitourinary:  Positive for flank pain and hematuria. Negative for dysuria, frequency, penile swelling, scrotal swelling and testicular pain.  Musculoskeletal:  Negative for arthralgias, back pain, myalgias, neck pain and neck stiffness.  Skin:  Negative for rash.  Neurological:  Negative for dizziness, weakness, numbness and headaches.  Hematological:  Does not bruise/bleed easily.   Physical Exam Updated Vital Signs BP  137/85   Pulse 79   Temp (!) 97.4 F (36.3 C) (Oral)   Resp 20   Ht 6' (1.829 m)   Wt (!) 142.9 kg   SpO2 95%   BMI 42.72 kg/m   Physical Exam Vitals and nursing note reviewed.  Constitutional:      Appearance: He is obese. He is not diaphoretic.     Comments: Patient is uncomfortable appearing, nontoxic  HENT:     Mouth/Throat:     Mouth: Mucous membranes are moist.  Cardiovascular:     Rate and Rhythm: Normal rate and regular rhythm.     Pulses: Normal pulses.  Pulmonary:     Effort: Pulmonary effort is normal.     Breath sounds: Normal breath sounds.  Chest:     Chest wall: No tenderness.  Abdominal:     Palpations: Abdomen is soft. There is no mass.     Tenderness: There is abdominal tenderness. There is no right CVA tenderness.     Comments: Left lateral and left lower quadrant tenderness to palpation.  No guarding or rebound tenderness.  No CVA tenderness.  Musculoskeletal:        General: Normal range of motion.     Right lower leg: No edema.     Left lower leg: No edema.  Skin:    General: Skin is warm.     Capillary Refill: Capillary refill takes less than 2 seconds.  Neurological:     General: No focal deficit  present.     Mental Status: He is alert.     Motor: No weakness.    ED Results / Procedures / Treatments   Labs (all labs ordered are listed, but only abnormal results are displayed) Labs Reviewed  CBC - Abnormal; Notable for the following components:      Result Value   WBC 12.5 (*)    All other components within normal limits  URINALYSIS, ROUTINE W REFLEX MICROSCOPIC - Abnormal; Notable for the following components:   Color, Urine AMBER (*)    APPearance CLOUDY (*)    Glucose, UA 50 (*)    Hgb urine dipstick MODERATE (*)    Ketones, ur 5 (*)    Protein, ur 100 (*)    RBC / HPF >50 (*)    All other components within normal limits  COMPREHENSIVE METABOLIC PANEL - Abnormal; Notable for the following components:   Glucose, Bld 136 (*)    Creatinine, Ser 1.57 (*)    GFR, Estimated 56 (*)    All other components within normal limits  URINE CULTURE  LIPASE, BLOOD    EKG None  Radiology CT ABDOMEN PELVIS WO CONTRAST  Result Date: 07/24/2021 CLINICAL DATA:  40 year old male with history of left-sided flank pain and left lower quadrant abdominal pain with hematuria since this morning. EXAM: CT ABDOMEN AND PELVIS WITHOUT CONTRAST TECHNIQUE: Multidetector CT imaging of the abdomen and pelvis was performed following the standard protocol without IV contrast. COMPARISON:  CT of the abdomen and pelvis 05/05/2021. FINDINGS: Lower chest: There is mild scarring in the lung bases bilaterally. Hepatobiliary: Diffuse low attenuation throughout the hepatic parenchyma, indicative of hepatic steatosis. Ill-defined intermediate attenuation material lying dependently in the gallbladder likely represents biliary sludge. No findings to suggest an acute cholecystitis at this time. Pancreas: No definite pancreatic mass or peripancreatic fluid collections or inflammatory changes are noted on today's noncontrast CT examination. Spleen: Unremarkable. Adrenals/Urinary Tract: Multiple nonobstructive calculi are  noted within the collecting systems of  both kidneys measuring 1-4 mm in size. In addition, at the left ureteropelvic junction (coronal image 63 of series 5) there is a 3 mm calculus. This is associated with very mild proximal left hydronephrosis. No additional calculi are noted along the course of the right ureter or within the lumen of the urinary bladder. No right hydroureteronephrosis. Right kidney, bilateral adrenal glands and urinary bladder are normal in appearance. Stomach/Bowel: The appearance of the stomach is normal. No pathologic dilatation of small bowel or colon. Normal appendix. Vascular/Lymphatic: Aortic atherosclerosis. No lymphadenopathy noted in the abdomen or pelvis. Reproductive: The prostate gland and seminal vesicles are unremarkable in appearance. Other: No significant volume of ascites.  No pneumoperitoneum. Musculoskeletal: There are no aggressive appearing lytic or blastic lesions noted in the visualized portions of the skeleton. IMPRESSION: 1. 3 mm calculus in the left ureteropelvic junction with mild proximal left hydronephrosis indicating mild obstruction. 2. Multiple additional nonobstructive calculi are noted within the collecting systems of both kidneys measuring 1-4 mm in size. 3. Hepatic steatosis. 4. Aortic atherosclerosis. Electronically Signed   By: Vinnie Langton M.D.   On: 07/24/2021 19:24    Procedures Procedures   Medications Ordered in ED Medications  ondansetron (ZOFRAN) injection 4 mg (4 mg Intravenous Given 07/24/21 1759)  HYDROmorphone (DILAUDID) injection 1 mg (1 mg Intravenous Given 07/24/21 1803)    ED Course  I have reviewed the triage vital signs and the nursing notes.  Pertinent labs & imaging results that were available during my care of the patient were reviewed by me and considered in my medical decision making (see chart for details).    MDM Rules/Calculators/A&P                           Patient here for evaluation of left flank pain and  hematuria.  Also has some left lower abdominal pain.  History of kidney stones, but states current pain feels different.  No reported fever or chills.  No urinary hesitancy.  On exam, patient is uncomfortable appearing, nontoxic.  Vital signs reviewed.  Will obtain labs and CT abdomen and pelvis.  Labs interpreted by me show a mild leukocytosis, chemistry show mildly elevated serum creatinine similar to baseline.  Transaminases reassuring.  Urinalysis show moderate hematuria with proteinuria no evidence of infection.  CT abdomen and pelvis shows a 3 mm calculus in the left UPJ with mild proximal hydronephrosis mild obstruction.  Patient has been given IV medication and reports pain has significantly improved.  Patient appears appropriate for discharge home and agrees to close outpatient follow-up with urology.  Will provide short course of pain medication and tamsulosin return precautions discussed.  All questions answered.    Final Clinical Impression(s) / ED Diagnoses Final diagnoses:  Ureterolithiasis    Rx / DC Orders ED Discharge Orders     None        Kem Parkinson, PA-C 07/28/21 1620    Teressa Lower, MD 07/29/21 [phone]

## 2021-07-24 NOTE — Telephone Encounter (Signed)
Should be on Aspirin and Brilinta (blood thinners) for a year, then on Aspirin 81 mg only. Please send refills for 3 monthsX2 refills.  Thanks MJP

## 2021-07-25 ENCOUNTER — Encounter (HOSPITAL_COMMUNITY): Admission: RE | Admit: 2021-07-25 | Payer: Managed Care, Other (non HMO) | Source: Ambulatory Visit

## 2021-07-25 ENCOUNTER — Other Ambulatory Visit: Payer: Self-pay

## 2021-07-25 MED ORDER — ASPIRIN 81 MG PO TBEC: 81.0000 mg | DELAYED_RELEASE_TABLET | Freq: Every day | ORAL | 2 refills | Status: DC

## 2021-07-25 MED ORDER — TICAGRELOR 90 MG PO TABS: 90.0000 mg | ORAL_TABLET | Freq: Two times a day (BID) | ORAL | 2 refills | Status: DC

## 2021-07-25 NOTE — Telephone Encounter (Signed)
Called and spoke to pt, pt voiced understanding. Refills have been sent.

## 2021-07-26 LAB — URINE CULTURE: Culture: <10000 — AB

## 2021-07-28 ENCOUNTER — Encounter (HOSPITAL_COMMUNITY): Payer: Managed Care, Other (non HMO)

## 2021-07-30 ENCOUNTER — Encounter (HOSPITAL_COMMUNITY): Payer: Managed Care, Other (non HMO)

## 2021-08-01 ENCOUNTER — Ambulatory Visit (INDEPENDENT_AMBULATORY_CARE_PROVIDER_SITE_OTHER): Payer: Managed Care, Other (non HMO) | Admitting: Pulmonary Disease

## 2021-08-01 ENCOUNTER — Encounter: Payer: Self-pay | Admitting: Pulmonary Disease

## 2021-08-01 ENCOUNTER — Encounter (HOSPITAL_COMMUNITY)
Admission: RE | Admit: 2021-08-01 | Discharge: 2021-08-01 | Disposition: A | Discharge status: Home / Self Care | Payer: Managed Care, Other (non HMO) | Source: Ambulatory Visit | Attending: Cardiology | Admitting: Cardiology

## 2021-08-01 ENCOUNTER — Ambulatory Visit: Payer: Managed Care, Other (non HMO) | Admitting: Pulmonary Disease

## 2021-08-01 ENCOUNTER — Other Ambulatory Visit: Payer: Self-pay

## 2021-08-01 VITALS — BP 122/78 | HR 72 | Temp 98.3°F | Ht 72.0 in | Wt 317.1 lb

## 2021-08-01 DIAGNOSIS — R0902 Hypoxemia: Secondary | ICD-10-CM | POA: Diagnosis not present

## 2021-08-01 DIAGNOSIS — D869 Sarcoidosis, unspecified: Secondary | ICD-10-CM | POA: Diagnosis not present

## 2021-08-01 DIAGNOSIS — R0602 Shortness of breath: Secondary | ICD-10-CM | POA: Diagnosis not present

## 2021-08-01 DIAGNOSIS — G4733 Obstructive sleep apnea (adult) (pediatric): Secondary | ICD-10-CM | POA: Diagnosis not present

## 2021-08-01 DIAGNOSIS — I214 Non-ST elevation (NSTEMI) myocardial infarction: Secondary | ICD-10-CM | POA: Insufficient documentation

## 2021-08-01 NOTE — Assessment & Plan Note (Signed)
06/2021 mediastinoscopy with lymph node biopsy shows noncaseating granulomas, stains negative for AFB and fungus He does not seem to have significant parenchymal involvement on imaging.  PFTs show preserved lung function.  He does not seem to have extrapulmonary involvement.  Calcium level is normal.  As such I do not feel the need to treat him with steroids. We discussed the natural course of sarcoidosis in the lung.

## 2021-08-01 NOTE — Patient Instructions (Signed)
Ambulatory sat Eye appt for sarcoidosis OK to go back to work CXR next visit

## 2021-08-01 NOTE — Assessment & Plan Note (Signed)
Compliant with CPAP by report and this is certainly helped improve his daytime somnolence and fatigue  Weight loss encouraged, compliance with goal of at least 4-6 hrs every night is the expectation. Advised against medications with sedative side effects Cautioned against driving when sleepy - understanding that sleepiness will vary on a day to day basis

## 2021-08-01 NOTE — Progress Notes (Signed)
   Subjective:    Patient ID: Zachary Weeks, male    DOB: 1981/06/30, 40 y.o.   MRN: [id number]  HPI  40 yo obese Man  for FU of mediastinal and hilar lymphadenopathy (first noted 10/2019 ) & OSA   PMH - RLE DVT in 10/2019   Underwent hematology evaluation and detailed hypercoagulable work-up which was mostly negative except PCR-ABL positive .  bone marrow biopsy 02/2020 neg 04/2021 NSTEMI, hosp adm for hypoxia   Family history of pulmonary fibrosis in his mother  Chief Complaint  Patient presents with   Follow-up    Reports no change in SOB or coughing.     06/2021 He underwent mediastinoscopy for lymph node biopsy which showed noncaseating granulomas.  Special stains were negative for AFB and fungus  He has been out of work since the procedure.  He had an ED visit for kidney stones and reports that his oxygen saturation was dropping as low as 70%.  I note that he was given Dilaudid, reviewed ED report.  Reports compliance with CPAP, no problems with mask or pressure.  Few missed nights when he was having pain due to kidney stones. On ambulation oxygen saturation stayed at 96%   Significant tests/ events reviewed  PFTs 06/2021 >> FVC 80%, no airway obs, TLC 84% , DLCO nml  HST 11/2020 [facility] severe OSA TBNA 10/2020 neg, scant lymphoid tissue   10/18/20 Ambulatory saturation-oxygen saturation was 92% on room air at rest and desaturated 91% on walking    04/2021 CT angiogram chest with diffuse nonspecific mediastinal and hilar adenopathy, minimal scarring in the right middle lobe, dependent atelectasis focal subpleural opacity in the right lower lobe likely additional atelectasis or scarring   10/2019 CT angio chest >> Mild mediastinal and bilateral hilar adenopathy, Prevascular lymph node has a short axis diameter of 14 mm. Subcarinal lymph node has a short axis diameter of 19 mm. Right paratracheal lymph node has a short axis diameter of 12 mm. No axillary adenopathy. 5 mm right  lower lobe peripheral nodule   PET 04/2020 >> Mediastinal and bilateral hilar lymphadenopathy with associated increased metabolic activity, favored to represent benign, reactive changes.   CT chest W con 08/2020 Unchanged enlarged mediastinal and bilateral hilar lymph nodes,  which remain nonspecific   Autoimmune Work up from [employer] 01/2020  Rheumatoid factor was negative quantifiron  negative ANA was negative angiotensin converting enzyme was negative anti-SSA antibody and anti-SSB antibody were negative ESR was 17 mm/hr factor V Leiden test was negative  Review of Systems neg for any significant sore throat, dysphagia, itching, sneezing, nasal congestion or excess/ purulent secretions, fever, chills, sweats, unintended wt loss, pleuritic or exertional cp, hempoptysis, orthopnea pnd or change in chronic leg swelling. Also denies presyncope, palpitations, heartburn, abdominal pain, nausea, vomiting, diarrhea or change in bowel or urinary habits, dysuria,hematuria, rash, arthralgias, visual complaints, headache, numbness weakness or ataxia.     Objective:   Physical Exam   Gen. Pleasant, obese, in no distress ENT - no lesions, no post nasal drip Neck: No JVD, no thyromegaly, no carotid bruits Lungs: no use of accessory muscles, no dullness to percussion, decreased without rales or rhonchi  Cardiovascular: Rhythm regular, heart sounds  normal, no murmurs or gallops, no peripheral edema Musculoskeletal: No deformities, no cyanosis or clubbing , no tremors        Assessment & Plan:

## 2021-08-01 NOTE — Progress Notes (Signed)
Daily Session Note  Patient Details  Name: Zachary Weeks MRN: [id number] Date of Birth: 05/26/1981 Referring Provider:   Flowsheet Row CARDIAC REHAB PHASE II ORIENTATION from 05/30/2021 in [place] [facility]  Referring Provider Dr. Thompson       Encounter Date: 08/01/2021  Check In:  Session Check In - 08/01/21 0815       Check-In   Supervising physician immediately available to respond to emergencies [facility] MD immediately available    Physician(s) Dr. Branch    Location [facility]    Staff Present Phyllis Billingsley, RN;Debra Johnson, RN, BSN    Virtual Visit No    Medication changes reported     No    Fall or balance concerns reported    No    Tobacco Cessation No Change    Warm-up and Cool-down Performed as group-led instruction    Resistance Training Performed Yes    VAD Patient? No    PAD/SET Patient? No      Pain Assessment   Currently in Pain? No/denies    Pain Score 0-No pain    Multiple Pain Sites No             Capillary Blood Glucose: No results found for this or any previous visit (from the past 24 hour(s)).    Social History   Tobacco Use  Smoking Status Former   Packs/day: 1.00   Years: 3.00   Pack years: 3.00   Types: Cigarettes   Quit date: 1999   Years since quitting: 23.7  Smokeless Tobacco Current   Types: Chew, Snuff    Goals Met:  Independence with exercise equipment Exercise tolerated well No report of concerns or symptoms today Strength training completed today  Goals Unmet:  Not Applicable  Comments: check out @ 9:15am   Dr. Jehanzeb Memon is Medical Director for [facility]. 

## 2021-08-01 NOTE — Assessment & Plan Note (Signed)
His intermittent hypoxia is difficult to explain .  Possible that OSA and pain meds could have caused this.  He does not seem to be retaining fluid and again he does not have significant parenchymal involvement with sarcoidosis.  Previous CT angiogram has not shown any evidence of pulm emboli

## 2021-08-04 ENCOUNTER — Encounter (HOSPITAL_COMMUNITY)
Admission: RE | Admit: 2021-08-04 | Discharge: 2021-08-04 | Disposition: A | Discharge status: Home / Self Care | Payer: Managed Care, Other (non HMO) | Source: Ambulatory Visit | Attending: Cardiology | Admitting: Cardiology

## 2021-08-04 ENCOUNTER — Encounter: Payer: Self-pay | Admitting: *Deleted

## 2021-08-04 ENCOUNTER — Other Ambulatory Visit: Payer: Self-pay

## 2021-08-04 VITALS — Wt 315.0 lb

## 2021-08-04 DIAGNOSIS — I214 Non-ST elevation (NSTEMI) myocardial infarction: Secondary | ICD-10-CM

## 2021-08-04 NOTE — Progress Notes (Signed)
Daily Session Note  Patient Details  Name: Zachary Weeks MRN: [id number] Date of Birth: 15-Dec-1980 Referring Provider:   Flowsheet Row CARDIAC REHAB PHASE II ORIENTATION from 05/30/2021 in [place]  Referring Provider Dr. Grandville Silos       Encounter Date: 08/04/2021  Check In:  Session Check In - 08/04/21 0815       Check-In   Supervising physician immediately available to respond to emergencies [facility] MD immediately available    Physician(s) Dr. Domenic Polite    Location [facility]    Staff Present Geanie Cooley, RN;Debra Wynetta Emery, RN, BSN    Virtual Visit No    Medication changes reported     No    Fall or balance concerns reported    No    Tobacco Cessation No Change    Warm-up and Cool-down Performed as group-led instruction    Resistance Training Performed Yes    VAD Patient? No    PAD/SET Patient? No      Pain Assessment   Currently in Pain? No/denies    Pain Score 0-No pain    Multiple Pain Sites No             Capillary Blood Glucose: No results found for this or any previous visit (from the past 24 hour(s)).    Social History   Tobacco Use  Smoking Status Former   Packs/day: 1.00   Years: 3.00   Pack years: 3.00   Types: Cigarettes   Quit date: 1999   Years since quitting: 23.7  Smokeless Tobacco Current   Types: Chew, Snuff    Goals Met:  Independence with exercise equipment Exercise tolerated well No report of concerns or symptoms today Strength training completed today  Goals Unmet:  Not Applicable  Comments: check out @ 9:15   Dr. Kathie Dike is Medical Director for [facility].

## 2021-08-06 ENCOUNTER — Encounter (HOSPITAL_COMMUNITY)
Admission: RE | Admit: 2021-08-06 | Discharge: 2021-08-06 | Disposition: A | Discharge status: Home / Self Care | Payer: Managed Care, Other (non HMO) | Source: Ambulatory Visit | Attending: Cardiology | Admitting: Cardiology

## 2021-08-06 ENCOUNTER — Other Ambulatory Visit: Payer: Self-pay

## 2021-08-06 DIAGNOSIS — I214 Non-ST elevation (NSTEMI) myocardial infarction: Secondary | ICD-10-CM

## 2021-08-06 NOTE — Progress Notes (Signed)
Daily Session Note  Patient Details  Name: Zachary Weeks MRN: [id number] Date of Birth: 06/28/1981 Referring Provider:   Flowsheet Row CARDIAC REHAB PHASE II ORIENTATION from 05/30/2021 in [facility]  Referring Provider Dr. Grandville Silos       Encounter Date: 08/06/2021  Check In:  Session Check In - 08/06/21 0815       Check-In   Supervising physician immediately available to respond to emergencies [facility] MD immediately available    Physician(s) Dr. Domenic Polite    Location [facility]    Staff Present Hoy Register, MS, ACSM-CEP, Exercise Physiologist;Debra Wynetta Emery, RN, BSN    Virtual Visit No    Medication changes reported     No    Fall or balance concerns reported    No    Tobacco Cessation No Change    Warm-up and Cool-down Performed as group-led instruction    Resistance Training Performed Yes    VAD Patient? No    PAD/SET Patient? No      Pain Assessment   Currently in Pain? No/denies    Pain Score 0-No pain    Multiple Pain Sites No             Capillary Blood Glucose: No results found for this or any previous visit (from the past 24 hour(s)).    Social History   Tobacco Use  Smoking Status Former   Packs/day: 1.00   Years: 3.00   Pack years: 3.00   Types: Cigarettes   Quit date: 1999   Years since quitting: 23.7  Smokeless Tobacco Current   Types: Chew, Snuff    Goals Met:  Independence with exercise equipment Exercise tolerated well No report of concerns or symptoms today Strength training completed today  Goals Unmet:  Not Applicable  Comments: checkout time is 0915   Dr. Kathie Dike is Medical Director for [facility].

## 2021-08-06 NOTE — Progress Notes (Signed)
Cardiac Individual Treatment Plan  Patient Details  Name: Zachary Weeks MRN: [id number] Date of Birth: [birth year] Referring Provider:   Flowsheet Row CARDIAC REHAB PHASE II ORIENTATION from 05/30/2021 in [place]  Referring Provider Dr. Grandville Silos       Initial Encounter Date:  Flowsheet Row CARDIAC REHAB PHASE II ORIENTATION from 05/30/2021 in [place]  Date 05/30/21       Visit Diagnosis: NSTEMI (non-ST elevated myocardial infarction) [facility]  Patient's Home Medications on Admission:  Current Outpatient Medications:    albuterol (VENTOLIN HFA) 108 (90 Base) MCG/ACT inhaler, Inhale 1-2 puffs into the lungs every 6 (six) hours as needed for wheezing or shortness of breath., Disp: , Rfl:    amLODipine (NORVASC) 5 MG tablet, Take 1 tablet (5 mg total) by mouth daily., Disp: 30 tablet, Rfl: 2   aspirin (ASPIRIN LOW DOSE) 81 MG EC tablet, Take 1 tablet (81 mg total) by mouth daily. TAKE 1 TABLET BY MOUTH DAILY swallow whole, Disp: 60 tablet, Rfl: 2   atorvastatin (LIPITOR) 40 MG tablet, Take 80 mg by mouth daily., Disp: , Rfl:    cholecalciferol (VITAMIN D) 25 MCG (1000 UNIT) tablet, Take 1,000 Units by mouth daily., Disp: , Rfl:    cyclobenzaprine (FLEXERIL) 10 MG tablet, Take 10 mg by mouth 3 (three) times daily as needed for muscle spasms., Disp: , Rfl:    metoprolol tartrate (LOPRESSOR) 25 MG tablet, Take 1.5 tablets (37.5 mg total) by mouth 2 (two) times daily., Disp: 180 tablet, Rfl: 3   nitroGLYCERIN (NITROSTAT) 0.4 MG SL tablet, Place 1 tablet (0.4 mg total) under the tongue every 5 (five) minutes as needed for chest pain., Disp: 30 tablet, Rfl: 1   pantoprazole (PROTONIX) 40 MG tablet, Take 40 mg by mouth in the morning., Disp: , Rfl:    ticagrelor (BRILINTA) 90 MG TABS tablet, Take 1 tablet (90 mg total) by mouth 2 (two) times daily., Disp: 180 tablet, Rfl: 2  Past Medical History: Past Medical History:  Diagnosis Date   Asthma     DVT (deep venous thrombosis) (HCC)    Dyspnea    at times - no known reason.    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   Myocardial infarction ([place])    Pneumonia    40- 40 years of age   Sleep apnea    CPAP @ HS    Tobacco Use: Social History   Tobacco Use  Smoking Status Former   Packs/day: 1.00   Years: 3.00   Pack years: 3.00   Types: Cigarettes   Quit date: 1999   Years since quitting: 23.7  Smokeless Tobacco Current   Types: Chew, Snuff    Labs: Recent Review Flowsheet Data     Labs for ITP Cardiac and Pulmonary Rehab Latest Ref Rng & Units 05/05/2021 05/06/2021 06/20/2021 07/09/2021   Cholestrol 100 - 199 mg/dL 158 - 152 -   LDLCALC 0 - 99 mg/dL 81 - 93 -   HDL >39 mg/dL 32(L) - 36(L) -   Trlycerides 0 - 149 mg/dL 227(H) - 130 -   Hemoglobin A1c 4.8 - 5.6 % 6.6(H) - - 6.3(H)   PHART 7.350 - 7.450 - 7.400 - -   PCO2ART 32.0 - 48.0 mmHg - 42.1 - -   HCO3 20.0 - 28.0 mmol/L - 25.5 - -   O2SAT % -  94.6 - -       Capillary Blood Glucose: Lab Results  Component Value Date   GLUCAP 122 (H) 05/07/2021   GLUCAP 132 (H) 05/07/2021   GLUCAP 144 (H) 05/06/2021   GLUCAP 116 (H) 05/06/2021     Exercise Target Goals: Exercise Program Goal: Individual exercise prescription set using results from initial 6 min walk test and THRR while considering  patient's activity barriers and safety.   Exercise Prescription Goal: Starting with aerobic activity 30 plus minutes a day, 3 days per week for initial exercise prescription. Provide home exercise prescription and guidelines that participant acknowledges understanding prior to discharge.  Activity Barriers & Risk Stratification:  Activity Barriers & Cardiac Risk Stratification - 05/30/21 1323       Activity Barriers & Cardiac Risk Stratification   Activity Barriers Shortness of Breath    Cardiac Risk Stratification High              6 Minute Walk:  6 Minute Walk     Row Name 05/30/21 1458         6 Minute Walk   Phase Initial     Distance 1100 feet     Walk Time 6 minutes     # of Rest Breaks 0     MPH 2.08     METS 3.17     RPE 9     VO2 Peak 11.1     Symptoms No     Resting HR 87 bpm     Resting BP 108/80     Resting Oxygen Saturation  92 %     Exercise Oxygen Saturation  during 6 min walk 92 %     Max Ex. HR 100 bpm     Max Ex. BP 140/90     2 Minute Post BP 124/82              Oxygen Initial Assessment:   Oxygen Re-Evaluation:   Oxygen Discharge (Final Oxygen Re-Evaluation):   Initial Exercise Prescription:  Initial Exercise Prescription - 05/30/21 1400       Date of Initial Exercise RX and Referring Provider   Date 05/30/21    Referring Provider Dr. Grandville Silos    Expected Discharge Date 08/22/21      Treadmill   MPH 1    Grade 0    Minutes 17      NuStep   Level 1    SPM 80    Minutes 22      Prescription Details   Frequency (times per week) 3    Duration Progress to 30 minutes of continuous aerobic without signs/symptoms of physical distress      Intensity   THRR 40-80% of Max Heartrate 71-142    Ratings of Perceived Exertion 11-13    Perceived Dyspnea 0-4      Resistance Training   Training Prescription Yes    Weight 5 lbs    Reps 10-15             Perform Capillary Blood Glucose checks as needed.  Exercise Prescription Changes:   Exercise Prescription Changes     Row Name 06/09/21 1100 06/23/21 0900 07/04/21 0815 07/21/21 0815 08/04/21 0815     Response to Exercise   Blood Pressure (Admit) 132/92 116/80 124/76 120/88 120/90   Blood Pressure (Exercise) 138/84 128/86 138/94 112/62 108/70   Blood Pressure (Exit) 122/80 116/80 118/64 92/60 110/68   Heart Rate (Admit) 78 bpm 88 bpm 69 bpm 75 bpm  83 bpm   Heart Rate (Exercise) 104 bpm 120 bpm 97 bpm 99 bpm 111 bpm   Heart Rate (Exit) 87 bpm 97 bpm 78 bpm 84 bpm 92 bpm   Rating of Perceived  Exertion (Exercise) '12 12 12 12 12   '$ Duration Continue with 30 min of aerobic exercise without signs/symptoms of physical distress. Continue with 30 min of aerobic exercise without signs/symptoms of physical distress. Continue with 30 min of aerobic exercise without signs/symptoms of physical distress. Continue with 30 min of aerobic exercise without signs/symptoms of physical distress. Continue with 30 min of aerobic exercise without signs/symptoms of physical distress.   Intensity THRR unchanged THRR unchanged THRR unchanged THRR unchanged THRR unchanged     Progression   Progression Continue to progress workloads to maintain intensity without signs/symptoms of physical distress. Continue to progress workloads to maintain intensity without signs/symptoms of physical distress. Continue to progress workloads to maintain intensity without signs/symptoms of physical distress. Continue to progress workloads to maintain intensity without signs/symptoms of physical distress. Continue to progress workloads to maintain intensity without signs/symptoms of physical distress.     Resistance Training   Training Prescription Yes Yes Yes Yes Yes   Weight 5 lbs 5 lbs 5 lbs 5 lbs 5 lbs   Reps 10-15 10-15 10-15 10-15 10-15   Time 10 Minutes 10 Minutes 10 Minutes 10 Minutes 10 Minutes     Treadmill   MPH 1.5 2 2.4 2.3 2.4   Grade 0 0 0 0 1   Minutes '17 17 17 17 17   '$ METs 2.15 2.53 2.84 2.76 3.17     NuStep   Level '1 3 4 4 4   '$ SPM 94 108 87 69 99   Minutes '22 22 22 22 22   '$ METs 2.18 2.22 2.41 2.78 2.86            Exercise Comments:   Exercise Goals and Review:   Exercise Goals     Row Name 05/30/21 1500 06/09/21 1127 07/07/21 1001 08/05/21 0958       Exercise Goals   Increase Physical Activity Yes Yes Yes Yes    Intervention Provide advice, education, support and counseling about physical activity/exercise needs.;Develop an individualized exercise prescription for aerobic and resistive  training based on initial evaluation findings, risk stratification, comorbidities and participant's personal goals. Provide advice, education, support and counseling about physical activity/exercise needs.;Develop an individualized exercise prescription for aerobic and resistive training based on initial evaluation findings, risk stratification, comorbidities and participant's personal goals. Provide advice, education, support and counseling about physical activity/exercise needs.;Develop an individualized exercise prescription for aerobic and resistive training based on initial evaluation findings, risk stratification, comorbidities and participant's personal goals. Provide advice, education, support and counseling about physical activity/exercise needs.;Develop an individualized exercise prescription for aerobic and resistive training based on initial evaluation findings, risk stratification, comorbidities and participant's personal goals.    Expected Outcomes Short Term: Attend rehab on a regular basis to increase amount of physical activity.;Long Term: Add in home exercise to make exercise part of routine and to increase amount of physical activity.;Long Term: Exercising regularly at least 3-5 days a week. Short Term: Attend rehab on a regular basis to increase amount of physical activity.;Long Term: Add in home exercise to make exercise part of routine and to increase amount of physical activity.;Long Term: Exercising regularly at least 3-5 days a week. Short Term: Attend rehab on a regular basis to increase amount of physical activity.;Long Term: Add in home  exercise to make exercise part of routine and to increase amount of physical activity.;Long Term: Exercising regularly at least 3-5 days a week. Short Term: Attend rehab on a regular basis to increase amount of physical activity.;Long Term: Add in home exercise to make exercise part of routine and to increase amount of physical activity.;Long Term:  Exercising regularly at least 3-5 days a week.    Increase Strength and Stamina Yes Yes Yes Yes    Intervention Provide advice, education, support and counseling about physical activity/exercise needs.;Develop an individualized exercise prescription for aerobic and resistive training based on initial evaluation findings, risk stratification, comorbidities and participant's personal goals. Provide advice, education, support and counseling about physical activity/exercise needs.;Develop an individualized exercise prescription for aerobic and resistive training based on initial evaluation findings, risk stratification, comorbidities and participant's personal goals. Provide advice, education, support and counseling about physical activity/exercise needs.;Develop an individualized exercise prescription for aerobic and resistive training based on initial evaluation findings, risk stratification, comorbidities and participant's personal goals. Provide advice, education, support and counseling about physical activity/exercise needs.;Develop an individualized exercise prescription for aerobic and resistive training based on initial evaluation findings, risk stratification, comorbidities and participant's personal goals.    Expected Outcomes Short Term: Increase workloads from initial exercise prescription for resistance, speed, and METs.;Short Term: Perform resistance training exercises routinely during rehab and add in resistance training at home;Long Term: Improve cardiorespiratory fitness, muscular endurance and strength as measured by increased METs and functional capacity (6MWT) Short Term: Increase workloads from initial exercise prescription for resistance, speed, and METs.;Short Term: Perform resistance training exercises routinely during rehab and add in resistance training at home;Long Term: Improve cardiorespiratory fitness, muscular endurance and strength as measured by increased METs and functional capacity  (6MWT) Short Term: Increase workloads from initial exercise prescription for resistance, speed, and METs.;Short Term: Perform resistance training exercises routinely during rehab and add in resistance training at home;Long Term: Improve cardiorespiratory fitness, muscular endurance and strength as measured by increased METs and functional capacity (6MWT) Short Term: Increase workloads from initial exercise prescription for resistance, speed, and METs.;Short Term: Perform resistance training exercises routinely during rehab and add in resistance training at home;Long Term: Improve cardiorespiratory fitness, muscular endurance and strength as measured by increased METs and functional capacity (6MWT)    Able to understand and use rate of perceived exertion (RPE) scale Yes Yes Yes Yes    Intervention Provide education and explanation on how to use RPE scale Provide education and explanation on how to use RPE scale Provide education and explanation on how to use RPE scale Provide education and explanation on how to use RPE scale    Expected Outcomes Short Term: Able to use RPE daily in rehab to express subjective intensity level;Long Term:  Able to use RPE to guide intensity level when exercising independently Short Term: Able to use RPE daily in rehab to express subjective intensity level;Long Term:  Able to use RPE to guide intensity level when exercising independently Short Term: Able to use RPE daily in rehab to express subjective intensity level;Long Term:  Able to use RPE to guide intensity level when exercising independently Short Term: Able to use RPE daily in rehab to express subjective intensity level;Long Term:  Able to use RPE to guide intensity level when exercising independently    Knowledge and understanding of Target Heart Rate Range (THRR) Yes Yes Yes Yes    Intervention Provide education and explanation of THRR including how the numbers were predicted and where they  are located for reference  Provide education and explanation of THRR including how the numbers were predicted and where they are located for reference Provide education and explanation of THRR including how the numbers were predicted and where they are located for reference Provide education and explanation of THRR including how the numbers were predicted and where they are located for reference    Expected Outcomes Short Term: Able to state/look up THRR;Long Term: Able to use THRR to govern intensity when exercising independently;Short Term: Able to use daily as guideline for intensity in rehab Short Term: Able to state/look up THRR;Long Term: Able to use THRR to govern intensity when exercising independently;Short Term: Able to use daily as guideline for intensity in rehab Short Term: Able to state/look up THRR;Long Term: Able to use THRR to govern intensity when exercising independently;Short Term: Able to use daily as guideline for intensity in rehab Short Term: Able to state/look up THRR;Long Term: Able to use THRR to govern intensity when exercising independently;Short Term: Able to use daily as guideline for intensity in rehab    Able to check pulse independently Yes Yes Yes Yes    Intervention Provide education and demonstration on how to check pulse in carotid and radial arteries.;Review the importance of being able to check your own pulse for safety during independent exercise Provide education and demonstration on how to check pulse in carotid and radial arteries.;Review the importance of being able to check your own pulse for safety during independent exercise Provide education and demonstration on how to check pulse in carotid and radial arteries.;Review the importance of being able to check your own pulse for safety during independent exercise Provide education and demonstration on how to check pulse in carotid and radial arteries.;Review the importance of being able to check your own pulse for safety during independent  exercise    Expected Outcomes Short Term: Able to explain why pulse checking is important during independent exercise;Long Term: Able to check pulse independently and accurately Short Term: Able to explain why pulse checking is important during independent exercise;Long Term: Able to check pulse independently and accurately Short Term: Able to explain why pulse checking is important during independent exercise;Long Term: Able to check pulse independently and accurately Short Term: Able to explain why pulse checking is important during independent exercise;Long Term: Able to check pulse independently and accurately    Understanding of Exercise Prescription Yes Yes Yes Yes    Intervention Provide education, explanation, and written materials on patient's individual exercise prescription Provide education, explanation, and written materials on patient's individual exercise prescription Provide education, explanation, and written materials on patient's individual exercise prescription Provide education, explanation, and written materials on patient's individual exercise prescription    Expected Outcomes Short Term: Able to explain program exercise prescription;Long Term: Able to explain home exercise prescription to exercise independently Short Term: Able to explain program exercise prescription;Long Term: Able to explain home exercise prescription to exercise independently Short Term: Able to explain program exercise prescription;Long Term: Able to explain home exercise prescription to exercise independently Short Term: Able to explain program exercise prescription;Long Term: Able to explain home exercise prescription to exercise independently             Exercise Goals Re-Evaluation :  Exercise Goals Re-Evaluation     Row Name 06/09/21 1127 07/07/21 1002 08/05/21 0958         Exercise Goal Re-Evaluation   Exercise Goals Review Increase Physical Activity;Increase Strength and Stamina;Able to  understand and use  rate of perceived exertion (RPE) scale;Knowledge and understanding of Target Heart Rate Range (THRR);Able to check pulse independently;Understanding of Exercise Prescription Increase Physical Activity;Increase Strength and Stamina;Able to understand and use rate of perceived exertion (RPE) scale;Knowledge and understanding of Target Heart Rate Range (THRR);Able to check pulse independently;Understanding of Exercise Prescription Increase Physical Activity;Increase Strength and Stamina;Able to understand and use rate of perceived exertion (RPE) scale;Knowledge and understanding of Target Heart Rate Range (THRR);Able to check pulse independently;Understanding of Exercise Prescription     Comments Pt has attended 5 sessions of cardiac rehab. He is motivated to increase his workloads. His BPs have been elevated and his doctors have been notified. He has tolerated exercise well so far. He is currently exercising at 2.18 METs on the stepper. Will continue to monitor and progress as able. Pt has attended 15 sessions of cardiac rehab. He continues to progress his workloads and is eager to exercise. He has been stressed lately, as a lymph node in his chest has significantly grown over the past few months. He is scheduled for a biopsy at the end of the week. It is unclear how much time he will miss after this. He is currently exercising at 2.41 METs on the stepper. Will continue to monitor and progress as able. Pt has completed 20 sessions of cardiac rehab. He continues to progress his workloads and enjoys being here. He has had to miss several sessions due to having a lymph node biopsy and having trouble passing a kidney stone. He is currently exercising at 2.86 METs on the stepper. Will continue to monitor and progress as able.     Expected Outcomes Through exercise at rehab and at home, the patient will meet their stated goals. Through exercise at rehab and at home, the patient will meet their stated  goals. Through exercise at rehab and at home, the patient will meet their stated goals.               Discharge Exercise Prescription (Final Exercise Prescription Changes):  Exercise Prescription Changes - 08/04/21 0815       Response to Exercise   Blood Pressure (Admit) 120/90    Blood Pressure (Exercise) 108/70    Blood Pressure (Exit) 110/68    Heart Rate (Admit) 83 bpm    Heart Rate (Exercise) 111 bpm    Heart Rate (Exit) 92 bpm    Rating of Perceived Exertion (Exercise) 12    Duration Continue with 30 min of aerobic exercise without signs/symptoms of physical distress.    Intensity THRR unchanged      Progression   Progression Continue to progress workloads to maintain intensity without signs/symptoms of physical distress.      Resistance Training   Training Prescription Yes    Weight 5 lbs    Reps 10-15    Time 10 Minutes      Treadmill   MPH 2.4    Grade 1    Minutes 17    METs 3.17      NuStep   Level 4    SPM 99    Minutes 22    METs 2.86             Nutrition:  Target Goals: Understanding of nutrition guidelines, daily intake of sodium '1500mg'$ , cholesterol '200mg'$ , calories 30% from fat and 7% or less from saturated fats, daily to have 5 or more servings of fruits and vegetables.  Biometrics:  Pre Biometrics - 05/30/21 1501  Pre Biometrics   Height 6' (1.829 m)    Waist Circumference 57 inches    Hip Circumference 51 inches    Waist to Hip Ratio 1.12 %    Triceps Skinfold 27 mm    % Body Fat 42.4 %    Grip Strength 57.9 kg    Flexibility 0 in    Single Leg Stand 27.64 seconds              Nutrition Therapy Plan and Nutrition Goals:  Nutrition Therapy & Goals - 06/02/21 1415       Personal Nutrition Goals   Comments Patient scored 50 on his diet assessment. We offer 2 educational sessions on heart healthy nutrition with handouts and offer assistance with RD referral if patient is interested.      Intervention Plan    Intervention Nutrition handout(s) given to patient.             Nutrition Assessments:  Nutrition Assessments - 05/30/21 1326       MEDFICTS Scores   Pre Score 50            MEDIFICTS Score Key: ?70 Need to make dietary changes  40-70 Heart Healthy Diet ? 40 Therapeutic Level Cholesterol Diet   Picture Your Plate Scores: [id number] Unhealthy dietary pattern with much room for improvement. 41-50 Dietary pattern unlikely to meet recommendations for good health and room for improvement. 51-60 More healthful dietary pattern, with some room for improvement.  >60 Healthy dietary pattern, although there may be some specific behaviors that could be improved.    Nutrition Goals Re-Evaluation:   Nutrition Goals Discharge (Final Nutrition Goals Re-Evaluation):   Psychosocial: Target Goals: Acknowledge presence or absence of significant depression and/or stress, maximize coping skills, provide positive support system. Participant is able to verbalize types and ability to use techniques and skills needed for reducing stress and depression.  Initial Review & Psychosocial Screening:  Initial Psych Review & Screening - 05/30/21 1328       Initial Review   Current issues with None Identified      Family Dynamics   Good Support System? Yes    Comments His support system includes his family. He also has several friends who check in on him regularly.      Barriers   Psychosocial barriers to participate in program There are no identifiable barriers or psychosocial needs.      Screening Interventions   Interventions Encouraged to exercise    Expected Outcomes Long Term Goal: Stressors or current issues are controlled or eliminated.             Quality of Life Scores:  Quality of Life - 05/30/21 1454       Quality of Life   Select Quality of Life      Quality of Life Scores   Health/Function Pre 17.43 %    Socioeconomic Pre 19.56 %    Psych/Spiritual Pre 23.57 %     Family Pre 17.4 %    GLOBAL Pre 19.19 %            Scores of 19 and below usually indicate a poorer quality of life in these areas.  A difference of  2-3 points is a clinically meaningful difference.  A difference of 2-3 points in the total score of the Quality of Life Index has been associated with significant improvement in overall quality of life, self-image, physical symptoms, and general health in studies assessing change in quality of life.  PHQ-9: Recent Review Flowsheet Data     Depression screen [facility] 2/9 05/30/2021   Decreased Interest 0   Down, Depressed, Hopeless 0   PHQ - 2 Score 0   Altered sleeping 0   Tired, decreased energy 0   Change in appetite 0   Feeling bad or failure about yourself  0   Trouble concentrating 0   Moving slowly or fidgety/restless 0   Suicidal thoughts 0   PHQ-9 Score 0   Difficult doing work/chores Not difficult at all      Interpretation of Total Score  Total Score Depression Severity:  1-4 = Minimal depression, 5-9 = Mild depression, 10-14 = Moderate depression, 15-19 = Moderately severe depression, 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 Pt has no psychosocial issues. He scored a 0 on his PHQ-9. He does report that finances are a bit tight now, but he is not stressed about it. He has now been cleared by both cardiology and pulmonary to return to work, which he will on Monday. He reports that he has a great support system with his family and his friends. Since his hospitial stay from his NSTEMI he has cleaned up his diet and has cut out friend foods and is limiting his consumption of sugary beverages. His goals while in the program are to continue to lose weight and decrease his  SOB with exertion. He is eager to start the program and meet his goals.    Expected Outcomes The pt will continue to not have any identfiable psychosocial issues.    Continue Psychosocial Services  No Follow up required             Psychosocial Re-Evaluation:  Psychosocial Re-Evaluation     [place] Name 06/02/21 1414 06/30/21 1314 07/31/21 0742         Psychosocial Re-Evaluation   Current issues with None Identified None Identified None Identified     Comments Patient is new to the program completing 2 sessions. He continues to have no psychosocial barriers identified. We will continue to monitor for progress. Patient continues to have no psychosocial barriers identified. He has completed 13 sessions and is doing well in the program. He demonstrataes an interest in improving his health. We will continue to monitor for progress. Patient continues to have no psychosocial barriers identified. He has completed 19 sessions and continues to do well in the program. He continues to demonstrate an interest in improving his health. We will continue to monitor for progress.     Expected Outcomes Patient will continue to have no psychosocial issues identified. Patient will continue to have no psychosocial issues identified. Patient will continue to have no psychosocial issues identified.     Interventions Encouraged to attend Cardiac Rehabilitation for the exercise;Relaxation education;Stress management education Encouraged to attend Cardiac Rehabilitation for the exercise;Relaxation education;Stress management education Encouraged to attend Cardiac Rehabilitation for the exercise;Relaxation education;Stress management education     Continue Psychosocial Services  No Follow up required No Follow up required No Follow up required              Psychosocial Discharge (Final Psychosocial Re-Evaluation):  Psychosocial Re-Evaluation - 07/31/21 [id number]       Psychosocial Re-Evaluation   Current issues with  None Identified    Comments Patient continues to have no psychosocial barriers identified. He has completed 19 sessions and continues to do well in the program. He continues to demonstrate an interest in improving his health. We will continue to monitor for progress.    Expected Outcomes Patient will continue to have no psychosocial issues identified.    Interventions Encouraged to attend Cardiac Rehabilitation for the exercise;Relaxation education;Stress management education    Continue Psychosocial Services  No Follow up required             Vocational Rehabilitation: Provide vocational rehab assistance to qualifying candidates.   Vocational Rehab Evaluation & Intervention:  Vocational Rehab - 05/30/21 1333       Initial Vocational Rehab Evaluation & Intervention   Assessment shows need for Vocational Rehabilitation No             Education: Education Goals: Education classes will be provided on a weekly basis, covering required topics. Participant will state understanding/return demonstration of topics presented.  Learning Barriers/Preferences:  Learning Barriers/Preferences - 05/30/21 1329       Learning Barriers/Preferences   Learning Barriers None    Learning Preferences Skilled Demonstration;Individual Instruction             Education Topics: Hypertension, Hypertension Reduction -Define heart disease and high blood pressure. Discus how high blood pressure affects the body and ways to reduce high blood pressure.   Exercise and Your Heart -Discuss why it is important to exercise, the FITT principles of exercise, normal and abnormal responses to exercise, and how to exercise safely. Flowsheet Row CARDIAC REHAB PHASE II EXERCISE from 06/11/2021 in [place]  Date 06/04/21  Educator DF  Instruction Review Code 2- Demonstrated Understanding       Angina -Discuss definition of angina, causes of angina, treatment of angina, and how  to decrease risk of having angina. Flowsheet Row CARDIAC REHAB PHASE II EXERCISE from 06/11/2021 in [place]  Date 06/11/21  Educator DF  Instruction Review Code 2- Demonstrated Understanding       Cardiac Medications -Review what the following cardiac medications are used for, how they affect the body, and side effects that may occur when taking the medications.  Medications include Aspirin, Beta blockers, calcium channel blockers, ACE Inhibitors, angiotensin receptor blockers, diuretics, digoxin, and antihyperlipidemics. Flowsheet Row CARDIAC REHAB PHASE II EXERCISE from 07/16/2021 in [place]  Date 06/18/21  Educator pb  Instruction Review Code 1- Verbalizes Understanding       Congestive Heart Failure -Discuss the definition of CHF, how to live with CHF, the signs and symptoms of CHF, and how keep track of weight and sodium intake. Flowsheet Row CARDIAC REHAB PHASE II EXERCISE from 07/16/2021 in [place]  Date 06/25/21  Educator DF  Instruction Review Code 2- Demonstrated Understanding       Heart Disease and Intimacy -Discus the effect sexual activity has on the heart, how changes occur during intimacy as we age, and safety during sexual activity. Flowsheet Row CARDIAC REHAB PHASE II EXERCISE from 07/16/2021 in [place]  Date 07/02/21  Educator DF  Instruction Review Code 2- Demonstrated Understanding       Smoking Cessation / COPD -Discuss different methods to quit smoking, the health benefits of quitting smoking, and the definition of COPD. Flowsheet Row CARDIAC REHAB PHASE II EXERCISE from 07/16/2021 in [place]  Date 07/09/21  Educator DF  Instruction Review Code  2- Demonstrated Understanding       Nutrition I: Fats -Discuss the types of cholesterol, what cholesterol does to the heart, and how cholesterol levels can be  controlled. Flowsheet Row CARDIAC REHAB PHASE II EXERCISE from 07/16/2021 in [place]  Date 07/16/21  Educator DJ  Instruction Review Code 1- Verbalizes Understanding       Nutrition II: Labels -Discuss the different components of food labels and how to read food label   Heart Parts/Heart Disease and PAD -Discuss the anatomy of the heart, the pathway of blood circulation through the heart, and these are affected by heart disease.   Stress I: Signs and Symptoms -Discuss the causes of stress, how stress may lead to anxiety and depression, and ways to limit stress.   Stress II: Relaxation -Discuss different types of relaxation techniques to limit stress.   Warning Signs of Stroke / TIA -Discuss definition of a stroke, what the signs and symptoms are of a stroke, and how to identify when someone is having stroke.   Knowledge Questionnaire Score:  Knowledge Questionnaire Score - 05/30/21 1329       Knowledge Questionnaire Score   Pre Score 20/24             Core Components/Risk Factors/Patient Goals at Admission:  Personal Goals and Risk Factors at Admission - 05/30/21 1333       Core Components/Risk Factors/Patient Goals on Admission    Weight Management Yes;Obesity;Weight Maintenance;Weight Loss    Intervention Weight Management: Develop a combined nutrition and exercise program designed to reach desired caloric intake, while maintaining appropriate intake of nutrient and fiber, sodium and fats, and appropriate energy expenditure required for the weight goal.;Weight Management: Provide education and appropriate resources to help participant work on and attain dietary goals.;Weight Management/Obesity: Establish reasonable short term and long term weight goals.;Obesity: Provide education and appropriate resources to help participant work on and attain dietary goals.    Expected Outcomes Short Term: Continue to assess and modify interventions until  short term weight is achieved;Long Term: Adherence to nutrition and physical activity/exercise program aimed toward attainment of established weight goal;Weight Maintenance: Understanding of the daily nutrition guidelines, which includes 25-35% calories from fat, 7% or less cal from saturated fats, less than '200mg'$  cholesterol, less than 1.5gm of sodium, & 5 or more servings of fruits and vegetables daily;Weight Loss: Understanding of general recommendations for a balanced deficit meal plan, which promotes 1-2 lb weight loss per week and includes a negative energy balance of [phone] kcal/d;Understanding recommendations for meals to include 15-35% energy as protein, 25-35% energy from fat, 35-60% energy from carbohydrates, less than '200mg'$  of dietary cholesterol, 20-35 gm of total fiber daily;Understanding of distribution of calorie intake throughout the day with the consumption of 4-5 meals/snacks    Improve shortness of breath with ADL's Yes    Intervention Provide education, individualized exercise plan and daily activity instruction to help decrease symptoms of SOB with activities of daily living.    Expected Outcomes Short Term: Improve cardiorespiratory fitness to achieve a reduction of symptoms when performing ADLs;Long Term: Be able to perform more ADLs without symptoms or delay the onset of symptoms    Heart Failure Yes    Intervention Provide a combined exercise and nutrition program that is supplemented with education, support and counseling about heart failure. Directed toward relieving symptoms such as shortness of breath, decreased exercise tolerance, and extremity edema.    Expected Outcomes Improve functional capacity of life;Short term: Attendance in  program 2-3 days a week with increased exercise capacity. Reported lower sodium intake. Reported increased fruit and vegetable intake. Reports medication compliance.;Short term: Daily weights obtained and reported for increase. Utilizing diuretic  protocols set by physician.;Long term: Adoption of self-care skills and reduction of barriers for early signs and symptoms recognition and intervention leading to self-care maintenance.    Hypertension Yes    Intervention Provide education on lifestyle modifcations including regular physical activity/exercise, weight management, moderate sodium restriction and increased consumption of fresh fruit, vegetables, and low fat dairy, alcohol moderation, and smoking cessation.;Monitor prescription use compliance.    Expected Outcomes Short Term: Continued assessment and intervention until BP is < 140/38m HG in hypertensive participants. < 130/823mHG in hypertensive participants with diabetes, heart failure or chronic kidney disease.;Long Term: Maintenance of blood pressure at goal levels.    Lipids Yes    Intervention Provide education and support for participant on nutrition & aerobic/resistive exercise along with prescribed medications to achieve LDL '70mg'$ , HDL >'40mg'$ .    Expected Outcomes Short Term: Participant states understanding of desired cholesterol values and is compliant with medications prescribed. Participant is following exercise prescription and nutrition guidelines.;Long Term: Cholesterol controlled with medications as prescribed, with individualized exercise RX and with personalized nutrition plan. Value goals: LDL < '70mg'$ , HDL > 40 mg.             Core Components/Risk Factors/Patient Goals Review:   Goals and Risk Factor Review     [place] Name 06/02/21 1416 06/30/21 1315 07/31/21 0743         Core Components/Risk Factors/Patient Goals Review   Personal Goals Review Other Other Other     Review Paitent referred to CR with NSTEMI. He has multiple risk factors for CAD and is participating in the program for risk modification. He has completed 1 sessions. His personal goals for the program are to continue to lose weight and get stronger. We will continue to monitor his progress as he works  toward meeting these personal goals. Patient has completed 13 sessions losing 3 lbs since his initial visit. He is doing well in the program with consistent attendance and progression. His blood pressure has been hypertensive since he started. Dr. PaVirgina Jockas been notified. He added Amlodipine 5 mg 06/13/21 with no improvement. Dr. PaVirgina Jockas also notified due to patient having a 5 beat run of NVT during exercise with no symptoms. He saw him 06/16/21 and he increased his Metoprolol from 25 mg BID to 37.5 mg BID and ordered a monitor. Patient is not wearing the monitor yet. His personal goals for the program are to continue to lose weight and decrease his SOB with exertion. We will continue to monitor his progress as he works towards meeting these goals and continue to monitor his blood pressure. Patient has completed 19 sessions maintaining his weight since last 30 day review. He continues to do well in the program with progression. He was seen in the ED 9/2 with flank pain and hematuria and was diagnosed with a kidney stone. He had a biopsy of enlarged lymph nodes found a a lung CT recently. His follow up with the surgeon was 8/30 and he was told labs are pending but biopsy results looks like sarcoidosis. He is to f/u with Dr. AlElsworth Sohon 10 days for treatment. His blood pressure has improved and is now at goal since Patwardhan made medication adjustment. His personal goals continue to be to continue his weight loss and decrease his SOB with exertion.  We will continue to monitor his progress as he continues to work toward meeting these goals.     Expected Outcomes Patient will complete the program meeting both personal and program goal.s Patient will complete the program meeting both personal and program goal.s Patient will complete the program meeting both personal and program goal.s              Core Components/Risk Factors/Patient Goals at Discharge (Final Review):   Goals and Risk Factor Review -  07/31/21 0743       Core Components/Risk Factors/Patient Goals Review   Personal Goals Review Other    Review Patient has completed 19 sessions maintaining his weight since last 30 day review. He continues to do well in the program with progression. He was seen in the ED 9/2 with flank pain and hematuria and was diagnosed with a kidney stone. He had a biopsy of enlarged lymph nodes found a a lung CT recently. His follow up with the surgeon was 8/30 and he was told labs are pending but biopsy results looks like sarcoidosis. He is to f/u with Dr. Elsworth Soho in 10 days for treatment. His blood pressure has improved and is now at goal since Patwardhan made medication adjustment. His personal goals continue to be to continue his weight loss and decrease his SOB with exertion. We will continue to monitor his progress as he continues to work toward meeting these goals.    Expected Outcomes Patient will complete the program meeting both personal and program goal.s             ITP Comments:   Comments: ITP REVIEW Pt is making expected progress toward Cardiac Rehab goals after completing 20 sessions. Recommend continued exercise, life style modification, education, and increased stamina and strength.

## 2021-08-08 ENCOUNTER — Encounter (HOSPITAL_COMMUNITY)
Admission: RE | Admit: 2021-08-08 | Discharge: 2021-08-08 | Disposition: A | Discharge status: Home / Self Care | Payer: Managed Care, Other (non HMO) | Source: Ambulatory Visit | Attending: Cardiology | Admitting: Cardiology

## 2021-08-08 ENCOUNTER — Other Ambulatory Visit: Payer: Self-pay

## 2021-08-08 DIAGNOSIS — I214 Non-ST elevation (NSTEMI) myocardial infarction: Secondary | ICD-10-CM | POA: Diagnosis not present

## 2021-08-08 NOTE — Progress Notes (Signed)
I have reviewed a Home Exercise Prescription with Zachary Weeks . Zachary Weeks is not currently exercising at home.  The patient was advised to walk 2-4 days a week for 30-45 minutes.  Zachary Weeks and I discussed how to progress their exercise prescription.  The patient stated that their goals were to lose weight.  The patient stated that they understand the exercise prescription.  We reviewed exercise guidelines, target heart rate during exercise, RPE Scale, weather conditions, NTG use, endpoints for exercise, warmup and cool down.  Patient is encouraged to come to me with any questions. I will continue to follow up with the patient to assist them with progression and safety.

## 2021-08-08 NOTE — Progress Notes (Signed)
Daily Session Note  Patient Details  Name: Zachary Weeks MRN: [id number] Date of Birth: December 18, 1980 Referring Provider:   Flowsheet Row CARDIAC REHAB PHASE II ORIENTATION from 05/30/2021 in [place]  Referring Provider Dr. Grandville Silos       Encounter Date: 08/08/2021  Check In:  Session Check In - 08/08/21 0815       Check-In   Supervising physician immediately available to respond to emergencies [facility] MD immediately available    Physician(s) Dr. Harrington Challenger    Location [facility]    Staff Present Hoy Register, MS, ACSM-CEP, Exercise Physiologist;Other    Virtual Visit No    Medication changes reported     No    Fall or balance concerns reported    No    Tobacco Cessation No Change    Warm-up and Cool-down Performed as group-led instruction    Resistance Training Performed Yes    VAD Patient? No    PAD/SET Patient? No      Pain Assessment   Currently in Pain? No/denies    Pain Score 0-No pain    Multiple Pain Sites No             Capillary Blood Glucose: No results found for this or any previous visit (from the past 24 hour(s)).    Social History   Tobacco Use  Smoking Status Former   Packs/day: 1.00   Years: 3.00   Pack years: 3.00   Types: Cigarettes   Quit date: 1999   Years since quitting: 23.7  Smokeless Tobacco Current   Types: Chew, Snuff    Goals Met:  Independence with exercise equipment Exercise tolerated well No report of concerns or symptoms today Strength training completed today  Goals Unmet:  Not Applicable  Comments: check out 0915   Dr. Kathie Dike is Medical Director for [facility].

## 2021-08-11 ENCOUNTER — Other Ambulatory Visit: Payer: Self-pay

## 2021-08-11 ENCOUNTER — Encounter (HOSPITAL_COMMUNITY)
Admission: RE | Admit: 2021-08-11 | Discharge: 2021-08-11 | Disposition: A | Discharge status: Home / Self Care | Payer: Managed Care, Other (non HMO) | Source: Ambulatory Visit | Attending: Cardiology | Admitting: Cardiology

## 2021-08-11 DIAGNOSIS — I214 Non-ST elevation (NSTEMI) myocardial infarction: Secondary | ICD-10-CM | POA: Diagnosis not present

## 2021-08-11 NOTE — Progress Notes (Signed)
Daily Session Note  Patient Details  Name: Zachary Weeks MRN: [id number] Date of Birth: [birth year] Referring Provider:   Flowsheet Row CARDIAC REHAB PHASE II ORIENTATION from 05/30/2021 in [place]  Referring Provider Dr. Grandville Silos       Encounter Date: 08/11/2021  Check In:  Session Check In - 08/11/21 0815       Check-In   Supervising physician immediately available to respond to emergencies [facility] MD immediately available    Physician(s) Dr. Harl Bowie    Location [facility]    Staff Present Redge Gainer, BS, Exercise Physiologist;Dalton Kris Mouton, MS, ACSM-CEP, Exercise Physiologist;Debra Wynetta Emery, RN, BSN    Virtual Visit No    Medication changes reported     No    Fall or balance concerns reported    No    Tobacco Cessation No Change    Warm-up and Cool-down Performed as group-led instruction    Resistance Training Performed Yes    VAD Patient? No    PAD/SET Patient? No      Pain Assessment   Currently in Pain? No/denies    Pain Score 0-No pain    Multiple Pain Sites No             Capillary Blood Glucose: No results found for this or any previous visit (from the past 24 hour(s)).    Social History   Tobacco Use  Smoking Status Former   Packs/day: 1.00   Years: 3.00   Pack years: 3.00   Types: Cigarettes   Quit date: 1999   Years since quitting: 23.7  Smokeless Tobacco Current   Types: Chew, Snuff    Goals Met:  Independence with exercise equipment Exercise tolerated well No report of concerns or symptoms today Strength training completed today  Goals Unmet:  Not Applicable  Comments: check out 0915   Dr. Kathie Dike is Medical Director for [facility].

## 2021-08-12 LAB — FUNGUS CULTURE RESULT

## 2021-08-12 LAB — FUNGUS CULTURE WITH STAIN

## 2021-08-12 LAB — FUNGAL ORGANISM REFLEX

## 2021-08-13 ENCOUNTER — Encounter (HOSPITAL_COMMUNITY)
Admission: RE | Admit: 2021-08-13 | Discharge: 2021-08-13 | Disposition: A | Discharge status: Home / Self Care | Payer: Managed Care, Other (non HMO) | Source: Ambulatory Visit | Attending: Cardiology | Admitting: Cardiology

## 2021-08-13 ENCOUNTER — Other Ambulatory Visit: Payer: Self-pay

## 2021-08-13 DIAGNOSIS — I214 Non-ST elevation (NSTEMI) myocardial infarction: Secondary | ICD-10-CM

## 2021-08-13 NOTE — Progress Notes (Signed)
Daily Session Note  Patient Details  Name: Zachary Weeks MRN: [id number] Date of Birth: [birth year] Referring Provider:   Flowsheet Row CARDIAC REHAB PHASE II ORIENTATION from 05/30/2021 in [place]  Referring Provider Dr. Grandville Silos       Encounter Date: 08/13/2021  Check In:  Session Check In - 08/13/21 0815       Check-In   Supervising physician immediately available to respond to emergencies [facility] MD immediately available    Physician(s) Dr. Harl Bowie    Location [facility]    Staff Present Redge Gainer, BS, Exercise Physiologist;Debra Wynetta Emery, RN, Bjorn Loser, MS, ACSM-CEP, Exercise Physiologist    Virtual Visit No    Medication changes reported     No    Fall or balance concerns reported    No    Tobacco Cessation No Change    Warm-up and Cool-down Performed as group-led instruction    Resistance Training Performed Yes    VAD Patient? No    PAD/SET Patient? No      Pain Assessment   Currently in Pain? No/denies    Pain Score 0-No pain    Multiple Pain Sites No             Capillary Blood Glucose: No results found for this or any previous visit (from the past 24 hour(s)).    Social History   Tobacco Use  Smoking Status Former   Packs/day: 1.00   Years: 3.00   Pack years: 3.00   Types: Cigarettes   Quit date: 1999   Years since quitting: 23.7  Smokeless Tobacco Current   Types: Chew, Snuff    Goals Met:  Independence with exercise equipment Exercise tolerated well No report of concerns or symptoms today Strength training completed today  Goals Unmet:  Not Applicable  Comments: check out 0915   Dr. Kathie Dike is Medical Director for [facility].

## 2021-08-14 ENCOUNTER — Other Ambulatory Visit: Payer: Self-pay | Admitting: Cardiology

## 2021-08-15 ENCOUNTER — Other Ambulatory Visit: Payer: Self-pay | Admitting: Cardiology

## 2021-08-15 ENCOUNTER — Encounter (HOSPITAL_COMMUNITY)
Admission: RE | Admit: 2021-08-15 | Discharge: 2021-08-15 | Disposition: A | Discharge status: Home / Self Care | Payer: Managed Care, Other (non HMO) | Source: Ambulatory Visit | Attending: Cardiology | Admitting: Cardiology

## 2021-08-15 ENCOUNTER — Other Ambulatory Visit: Payer: Self-pay

## 2021-08-15 DIAGNOSIS — I214 Non-ST elevation (NSTEMI) myocardial infarction: Secondary | ICD-10-CM | POA: Diagnosis not present

## 2021-08-15 NOTE — Research (Unsigned)
Called patient to discuss AMGEN Lpa trial. Message left for a return call

## 2021-08-15 NOTE — Progress Notes (Signed)
Daily Session Note  Patient Details  Name: Zachary Weeks MRN: [id number] Date of Birth: 03/20/1981 Referring Provider:   Flowsheet Row CARDIAC REHAB PHASE II ORIENTATION from 05/30/2021 in [place] [facility]  Referring Provider Dr. Thompson       Encounter Date: 08/15/2021  Check In:  Session Check In - 08/15/21 0815       Check-In   Supervising physician immediately available to respond to emergencies [facility] MD immediately available    Physician(s) Dr. Branch    Location [facility]    Staff Present Dalton Fletcher, MS, ACSM-CEP, Exercise Physiologist;Heather Jachimiak, BS, Exercise Physiologist;Debra Johnson, RN, BSN    Virtual Visit No    Medication changes reported     No    Fall or balance concerns reported    No    Tobacco Cessation No Change    Warm-up and Cool-down Performed as group-led instruction    Resistance Training Performed Yes    VAD Patient? No    PAD/SET Patient? No      Pain Assessment   Currently in Pain? No/denies    Pain Score 0-No pain    Multiple Pain Sites No             Capillary Blood Glucose: No results found for this or any previous visit (from the past 24 hour(s)).    Social History   Tobacco Use  Smoking Status Former   Packs/day: 1.00   Years: 3.00   Pack years: 3.00   Types: Cigarettes   Quit date: 1999   Years since quitting: 23.7  Smokeless Tobacco Current   Types: Chew, Snuff    Goals Met:  Independence with exercise equipment Exercise tolerated well No report of concerns or symptoms today Strength training completed today  Goals Unmet:  Not Applicable  Comments: check out 0915   Dr. Jehanzeb Memon is Medical Director for [place] [facility]. 

## 2021-08-18 ENCOUNTER — Other Ambulatory Visit: Payer: Self-pay

## 2021-08-18 ENCOUNTER — Encounter (HOSPITAL_COMMUNITY)
Admission: RE | Admit: 2021-08-18 | Discharge: 2021-08-18 | Disposition: A | Discharge status: Home / Self Care | Payer: Managed Care, Other (non HMO) | Source: Ambulatory Visit | Attending: Cardiology | Admitting: Cardiology

## 2021-08-18 VITALS — Wt 315.5 lb

## 2021-08-18 DIAGNOSIS — I214 Non-ST elevation (NSTEMI) myocardial infarction: Secondary | ICD-10-CM | POA: Diagnosis not present

## 2021-08-18 NOTE — Progress Notes (Signed)
Daily Session Note  Patient Details  Name: Zachary Weeks MRN: [id number] Date of Birth: 1981/01/15 Referring Provider:   Flowsheet Row CARDIAC REHAB PHASE II ORIENTATION from 05/30/2021 in [place]  Referring Provider Dr. Grandville Silos       Encounter Date: 08/18/2021  Check In:  Session Check In - 08/18/21 0815       Check-In   Supervising physician immediately available to respond to emergencies [facility] MD immediately available    Physician(s) Dr. Johnsie Cancel    Location [facility]    Staff Present Hoy Register, MS, ACSM-CEP, Exercise Physiologist;Lindsey Hommel Zigmund Daniel, Exercise Physiologist    Virtual Visit No    Medication changes reported     No    Fall or balance concerns reported    No    Tobacco Cessation No Change    Warm-up and Cool-down Performed as group-led instruction    Resistance Training Performed Yes    VAD Patient? No    PAD/SET Patient? No      Pain Assessment   Currently in Pain? No/denies    Pain Score 0-No pain    Multiple Pain Sites No             Capillary Blood Glucose: No results found for this or any previous visit (from the past 24 hour(s)).    Social History   Tobacco Use  Smoking Status Former   Packs/day: 1.00   Years: 3.00   Pack years: 3.00   Types: Cigarettes   Quit date: 1999   Years since quitting: 23.7  Smokeless Tobacco Current   Types: Chew, Snuff    Goals Met:  Independence with exercise equipment Exercise tolerated well No report of concerns or symptoms today Strength training completed today  Goals Unmet:  Not Applicable  Comments: check out 0915   Dr. Kathie Dike is Medical Director for [facility].

## 2021-08-19 ENCOUNTER — Other Ambulatory Visit: Payer: Self-pay

## 2021-08-19 ENCOUNTER — Telehealth: Payer: Self-pay | Admitting: Cardiology

## 2021-08-19 MED ORDER — ATORVASTATIN CALCIUM 80 MG PO TABS: 80.0000 mg | ORAL_TABLET | Freq: Every day | ORAL | 3 refills | Status: DC

## 2021-08-19 NOTE — Telephone Encounter (Signed)
Pt aware. Refill has been sent.

## 2021-08-19 NOTE — Telephone Encounter (Signed)
Patient says his brilinta had run out, when started taking it again he felt pain in his left calf & woke up with a bruise on the side of his knee. Patient says PCP recommended he contact cardiologist. Patient says he's also out of cholesterol meds (doesn't know name of it, may be the lipitor) 80 mg, says he knows he's supposed to take 80 mg half a tablet but pharmacy says the prescription is for 40 mg. Patient wanted clarification on that. Phone # [phone].

## 2021-08-19 NOTE — Telephone Encounter (Signed)
Likely side effect of Brilinta. Hold for two days and resume. Bruise should heal by itself.   Also, please send prescription for Lipitor 80 mg, 90 pillX3 refills  Thanks MJP

## 2021-08-20 ENCOUNTER — Other Ambulatory Visit: Payer: Self-pay

## 2021-08-20 ENCOUNTER — Encounter (HOSPITAL_COMMUNITY)
Admission: RE | Admit: 2021-08-20 | Discharge: 2021-08-20 | Disposition: A | Discharge status: Home / Self Care | Payer: Managed Care, Other (non HMO) | Source: Ambulatory Visit | Attending: Cardiology | Admitting: Cardiology

## 2021-08-20 DIAGNOSIS — I214 Non-ST elevation (NSTEMI) myocardial infarction: Secondary | ICD-10-CM | POA: Diagnosis not present

## 2021-08-20 NOTE — Progress Notes (Signed)
Daily Session Note  Patient Details  Name: Zachary Weeks MRN: [id number] Date of Birth: 1981-09-29 Referring Provider:   Flowsheet Row CARDIAC REHAB PHASE II ORIENTATION from 05/30/2021 in [place]  Referring Provider Dr. Grandville Silos       Encounter Date: 08/20/2021  Check In:  Session Check In - 08/20/21 0815       Check-In   Supervising physician immediately available to respond to emergencies [facility] MD immediately available    Physician(s) Dr. Domenic Polite    Location [facility]    Staff Present Geanie Cooley, RN;Heather Otho Ket, BS, Exercise Physiologist;Dalton Kris Mouton, MS, ACSM-CEP, Exercise Physiologist    Virtual Visit No    Medication changes reported     No    Fall or balance concerns reported    No    Tobacco Cessation No Change    Warm-up and Cool-down Performed as group-led instruction    Resistance Training Performed Yes    VAD Patient? No    PAD/SET Patient? No      Pain Assessment   Currently in Pain? No/denies    Pain Score 0-No pain    Multiple Pain Sites No             Capillary Blood Glucose: No results found for this or any previous visit (from the past 24 hour(s)).    Social History   Tobacco Use  Smoking Status Former   Packs/day: 1.00   Years: 3.00   Pack years: 3.00   Types: Cigarettes   Quit date: 1999   Years since quitting: 23.7  Smokeless Tobacco Current   Types: Chew, Snuff    Goals Met:  Independence with exercise equipment Exercise tolerated well No report of concerns or symptoms today Strength training completed today  Goals Unmet:  Not Applicable  Comments: check out @ 9:15am   Dr. Kathie Dike is Medical Director for [facility].

## 2021-08-22 ENCOUNTER — Other Ambulatory Visit: Payer: Self-pay

## 2021-08-22 ENCOUNTER — Encounter (HOSPITAL_COMMUNITY)
Admission: RE | Admit: 2021-08-22 | Discharge: 2021-08-22 | Disposition: A | Discharge status: Home / Self Care | Payer: Managed Care, Other (non HMO) | Source: Ambulatory Visit | Attending: Cardiology | Admitting: Cardiology

## 2021-08-22 VITALS — Ht 72.0 in | Wt 318.1 lb

## 2021-08-22 DIAGNOSIS — I214 Non-ST elevation (NSTEMI) myocardial infarction: Secondary | ICD-10-CM | POA: Diagnosis not present

## 2021-08-22 NOTE — Progress Notes (Signed)
Daily Session Note  Patient Details  Name: Zachary Weeks MRN: [id number] Date of Birth: Apr 28, 1981 Referring Provider:   Flowsheet Row CARDIAC REHAB PHASE II ORIENTATION from 05/30/2021 in [place]  Referring Provider Dr. Grandville Silos       Encounter Date: 08/22/2021  Check In:  Session Check In - 08/22/21 0815       Check-In   Supervising physician immediately available to respond to emergencies [facility] MD immediately available    Physician(s) Dr. Johnsie Cancel    Location [facility]    Staff Present Hoy Register, MS, ACSM-CEP, Exercise Physiologist;Debra Wynetta Emery, RN, BSN;Heather Otho Ket, BS, Exercise Physiologist    Virtual Visit No    Medication changes reported     No    Fall or balance concerns reported    No    Tobacco Cessation No Change    Warm-up and Cool-down Performed as group-led instruction    Resistance Training Performed Yes    VAD Patient? No    PAD/SET Patient? No      Pain Assessment   Currently in Pain? No/denies    Pain Score 0-No pain    Multiple Pain Sites No             Capillary Blood Glucose: No results found for this or any previous visit (from the past 24 hour(s)).    Social History   Tobacco Use  Smoking Status Former   Packs/day: 1.00   Years: 3.00   Pack years: 3.00   Types: Cigarettes   Quit date: 1999   Years since quitting: 23.7  Smokeless Tobacco Current   Types: Chew, Snuff    Goals Met:  Independence with exercise equipment Exercise tolerated well No report of concerns or symptoms today Strength training completed today  Goals Unmet:  Not Applicable  Comments: checkout time is 0915   Dr. Kathie Dike is Medical Director for [facility].

## 2021-08-26 NOTE — Progress Notes (Signed)
Discharge Progress Report  Patient Details  Name: Zachary Weeks MRN: [id number] Date of Birth: May 24, 1981 Referring Provider:   Flowsheet Row CARDIAC REHAB PHASE II ORIENTATION from 05/30/2021 in [facility]  Referring Provider Dr. Grandville Silos        Number of Visits: 28  Reason for Discharge:  Patient reached a stable level of exercise. Patient independent in their exercise. Patient has met program and personal goals.  Smoking History:  Social History   Tobacco Use  Smoking Status Former   Packs/day: 1.00   Years: 3.00   Pack years: 3.00   Types: Cigarettes   Quit date: 1999   Years since quitting: 23.7  Smokeless Tobacco Current   Types: Chew, Snuff    Diagnosis:  NSTEMI (non-ST elevated myocardial infarction) ([place])  ADL [facility]SD:   Initial Exercise Prescription:  Initial Exercise Prescription - 05/30/21 1400       Date of Initial Exercise RX and Referring Provider   Date 05/30/21    Referring Provider Dr. Grandville Silos    Expected Discharge Date 08/22/21      Treadmill   MPH 1    Grade 0    Minutes 17      NuStep   Level 1    SPM 80    Minutes 22      Prescription Details   Frequency (times per week) 3    Duration Progress to 30 minutes of continuous aerobic without signs/symptoms of physical distress      Intensity   THRR 40-80% of Max Heartrate 71-142    Ratings of Perceived Exertion 11-13    Perceived Dyspnea 0-4      Resistance Training   Training Prescription Yes    Weight 5 lbs    Reps 10-15             Discharge Exercise Prescription (Final Exercise Prescription Changes):  Exercise Prescription Changes - 08/18/21 0900       Response to Exercise   Blood Pressure (Admit) 125/75    Blood Pressure (Exercise) 124/72    Blood Pressure (Exit) 115/70    Heart Rate (Admit) 78 bpm    Heart Rate (Exercise) 103 bpm    Heart Rate (Exit) 87 bpm    Rating of Perceived Exertion (Exercise) 12    Duration Continue with 30 min  of aerobic exercise without signs/symptoms of physical distress.    Intensity THRR unchanged      Progression   Progression Continue to progress workloads to maintain intensity without signs/symptoms of physical distress.      Resistance Training   Training Prescription Yes    Weight 5    Reps 10-15    Time 10 Minutes      Treadmill   MPH 2.3    Grade 2.5    Minutes 17    METs 3.55      NuStep   Level 4    SPM 114    Minutes 22    METs 2.77             Functional Capacity:  6 Minute Walk     Row Name 05/30/21 1458 08/22/21 0911       6 Minute Walk   Phase Initial Discharge    Distance 1100 feet 1300 feet    Distance Feet Change -- 200 ft    Walk Time 6 minutes 6 minutes    # of Rest Breaks 0 0    MPH 2.08 2.5  METS 3.17 3.35    RPE 9 12    VO2 Peak 11.1 11.72    Symptoms No No    Resting HR 87 bpm 74 bpm    Resting BP 108/80 115/65    Resting Oxygen Saturation  92 % 92 %    Exercise Oxygen Saturation  during 6 min walk 92 % 93 %    Max Ex. HR 100 bpm 89 bpm    Max Ex. BP 140/90 130/65    2 Minute Post BP 124/82 120/60             Psychological, QOL, Others - Outcomes: PHQ 2/9: Depression screen [facility] 2/9 08/26/2021 05/30/2021  Decreased Interest 0 0  Down, Depressed, Hopeless 0 0  PHQ - 2 Score 0 0  Altered sleeping 1 0  Tired, decreased energy 1 0  Change in appetite 0 0  Feeling bad or failure about yourself  0 0  Trouble concentrating 0 0  Moving slowly or fidgety/restless 0 0  Suicidal thoughts 0 0  PHQ-9 Score 2 0  Difficult doing work/chores Not difficult at all Not difficult at all    Quality of Life:  Quality of Life - 08/20/21 0936       Quality of Life   Select Quality of Life      Quality of Life Scores   Health/Function Pre 17.43 %    Health/Function Post 19.93 %    Health/Function % Change 14.34 %    Socioeconomic Pre 19.56 %    Socioeconomic Post 20.14 %    Socioeconomic % Change  2.97 %    Psych/Spiritual Pre  23.57 %    Psych/Spiritual Post 20.14 %    Psych/Spiritual % Change -14.55 %    Family Pre 17.4 %    Family Post 20 %    Family % Change 14.94 %    GLOBAL Pre 19.19 %    GLOBAL Post 20.03 %    GLOBAL % Change 4.38 %             Personal Goals: Goals established at orientation with interventions provided to work toward goal.  Personal Goals and Risk Factors at Admission - 05/30/21 1333       Core Components/Risk Factors/Patient Goals on Admission    Weight Management Yes;Obesity;Weight Maintenance;Weight Loss    Intervention Weight Management: Develop a combined nutrition and exercise program designed to reach desired caloric intake, while maintaining appropriate intake of nutrient and fiber, sodium and fats, and appropriate energy expenditure required for the weight goal.;Weight Management: Provide education and appropriate resources to help participant work on and attain dietary goals.;Weight Management/Obesity: Establish reasonable short term and long term weight goals.;Obesity: Provide education and appropriate resources to help participant work on and attain dietary goals.    Expected Outcomes Short Term: Continue to assess and modify interventions until short term weight is achieved;Long Term: Adherence to nutrition and physical activity/exercise program aimed toward attainment of established weight goal;Weight Maintenance: Understanding of the daily nutrition guidelines, which includes 25-35% calories from fat, 7% or less cal from saturated fats, less than 247m cholesterol, less than 1.5gm of sodium, & 5 or more servings of fruits and vegetables daily;Weight Loss: Understanding of general recommendations for a balanced deficit meal plan, which promotes 1-2 lb weight loss per week and includes a negative energy balance of [phone] kcal/d;Understanding recommendations for meals to include 15-35% energy as protein, 25-35% energy from fat, 35-60% energy from carbohydrates, less than 2062m of  dietary cholesterol, 20-35 gm of total fiber daily;Understanding of distribution of calorie intake throughout the day with the consumption of 4-5 meals/snacks    Improve shortness of breath with ADL's Yes    Intervention Provide education, individualized exercise plan and daily activity instruction to help decrease symptoms of SOB with activities of daily living.    Expected Outcomes Short Term: Improve cardiorespiratory fitness to achieve a reduction of symptoms when performing ADLs;Long Term: Be able to perform more ADLs without symptoms or delay the onset of symptoms    Heart Failure Yes    Intervention Provide a combined exercise and nutrition program that is supplemented with education, support and counseling about heart failure. Directed toward relieving symptoms such as shortness of breath, decreased exercise tolerance, and extremity edema.    Expected Outcomes Improve functional capacity of life;Short term: Attendance in program 2-3 days a week with increased exercise capacity. Reported lower sodium intake. Reported increased fruit and vegetable intake. Reports medication compliance.;Short term: Daily weights obtained and reported for increase. Utilizing diuretic protocols set by physician.;Long term: Adoption of self-care skills and reduction of barriers for early signs and symptoms recognition and intervention leading to self-care maintenance.    Hypertension Yes    Intervention Provide education on lifestyle modifcations including regular physical activity/exercise, weight management, moderate sodium restriction and increased consumption of fresh fruit, vegetables, and low fat dairy, alcohol moderation, and smoking cessation.;Monitor prescription use compliance.    Expected Outcomes Short Term: Continued assessment and intervention until BP is < 140/56m HG in hypertensive participants. < 130/865mHG in hypertensive participants with diabetes, heart failure or chronic kidney disease.;Long  Term: Maintenance of blood pressure at goal levels.    Lipids Yes    Intervention Provide education and support for participant on nutrition & aerobic/resistive exercise along with prescribed medications to achieve LDL <7087mHDL >72m2m  Expected Outcomes Short Term: Participant states understanding of desired cholesterol values and is compliant with medications prescribed. Participant is following exercise prescription and nutrition guidelines.;Long Term: Cholesterol controlled with medications as prescribed, with individualized exercise RX and with personalized nutrition plan. Value goals: LDL < 70mg2mL > 40 mg.              Personal Goals Discharge:  Goals and Risk Factor Review     Row Name 06/02/21 1416 06/30/21 1315 07/31/21 0743 08/26/21 0825       Core Components/Risk Factors/Patient Goals Review   Personal Goals Review Other Other Other Other    Review Paitent referred to CR with NSTEMI. He has multiple risk factors for CAD and is participating in the program for risk modification. He has completed 1 sessions. His personal goals for the program are to continue to lose weight and get stronger. We will continue to monitor his progress as he works toward meeting these personal goals. Patient has completed 13 sessions losing 3 lbs since his initial visit. He is doing well in the program with consistent attendance and progression. His blood pressure has been hypertensive since he started. Dr. PatwaVirgina Jockbeen notified. He added Amlodipine 5 mg 06/13/21 with no improvement. Dr. PatwaVirgina Jockalso notified due to patient having a 5 beat run of NVT during exercise with no symptoms. He saw him 06/16/21 and he increased his Metoprolol from 25 mg BID to 37.5 mg BID and ordered a monitor. Patient is not wearing the monitor yet. His personal goals for the program are to continue to lose weight and decrease his SOB with  exertion. We will continue to monitor his progress as he works towards  meeting these goals and continue to monitor his blood pressure. Patient has completed 19 sessions maintaining his weight since last 30 day review. He continues to do well in the program with progression. He was seen in the ED 9/2 with flank pain and hematuria and was diagnosed with a kidney stone. He had a biopsy of enlarged lymph nodes found a a lung CT recently. His follow up with the surgeon was 8/30 and he was told labs are pending but biopsy results looks like sarcoidosis. He is to f/u with Dr. Elsworth Soho in 10 days for treatment. His blood pressure has improved and is now at goal since Pat[place]han made medication adjustment. His personal goals continue to be to continue his weight loss and decrease his SOB with exertion. We will continue to monitor his progress as he continues to work toward meeting these goals. Pt graduated after 28 sessions. His weight was stable throughout the program. After Dr. Virgina Jock made some medication adjustments, his BP decreased significantly and was no longer elevated. The biopsy of his enlarge lymph node in his chest was negative for malignancy. His goals now that he is done is to continue to stay active, lose weight, and to decrease his SOB with activity.    Expected Outcomes Patient will complete the program meeting both personal and program goal.s Patient will complete the program meeting both personal and program goal.s Patient will complete the program meeting both personal and program goal.s Patient will continue to work towards their personal goals post discharge.             Exercise Goals and Review:  Exercise Goals     Row Name 05/30/21 1500 06/09/21 1127 07/07/21 1001 08/05/21 0958       Exercise Goals   Increase Physical Activity Yes Yes Yes Yes    Intervention Provide advice, education, support and counseling about physical activity/exercise needs.;Develop an individualized exercise prescription for aerobic and resistive training based on initial  evaluation findings, risk stratification, comorbidities and participant's personal goals. Provide advice, education, support and counseling about physical activity/exercise needs.;Develop an individualized exercise prescription for aerobic and resistive training based on initial evaluation findings, risk stratification, comorbidities and participant's personal goals. Provide advice, education, support and counseling about physical activity/exercise needs.;Develop an individualized exercise prescription for aerobic and resistive training based on initial evaluation findings, risk stratification, comorbidities and participant's personal goals. Provide advice, education, support and counseling about physical activity/exercise needs.;Develop an individualized exercise prescription for aerobic and resistive training based on initial evaluation findings, risk stratification, comorbidities and participant's personal goals.    Expected Outcomes Short Term: Attend rehab on a regular basis to increase amount of physical activity.;Long Term: Add in home exercise to make exercise part of routine and to increase amount of physical activity.;Long Term: Exercising regularly at least 3-5 days a week. Short Term: Attend rehab on a regular basis to increase amount of physical activity.;Long Term: Add in home exercise to make exercise part of routine and to increase amount of physical activity.;Long Term: Exercising regularly at least 3-5 days a week. Short Term: Attend rehab on a regular basis to increase amount of physical activity.;Long Term: Add in home exercise to make exercise part of routine and to increase amount of physical activity.;Long Term: Exercising regularly at least 3-5 days a week. Short Term: Attend rehab on a regular basis to increase amount of physical activity.;Long Term: Add in home exercise to  make exercise part of routine and to increase amount of physical activity.;Long Term: Exercising regularly at least  3-5 days a week.    Increase Strength and Stamina Yes Yes Yes Yes    Intervention Provide advice, education, support and counseling about physical activity/exercise needs.;Develop an individualized exercise prescription for aerobic and resistive training based on initial evaluation findings, risk stratification, comorbidities and participant's personal goals. Provide advice, education, support and counseling about physical activity/exercise needs.;Develop an individualized exercise prescription for aerobic and resistive training based on initial evaluation findings, risk stratification, comorbidities and participant's personal goals. Provide advice, education, support and counseling about physical activity/exercise needs.;Develop an individualized exercise prescription for aerobic and resistive training based on initial evaluation findings, risk stratification, comorbidities and participant's personal goals. Provide advice, education, support and counseling about physical activity/exercise needs.;Develop an individualized exercise prescription for aerobic and resistive training based on initial evaluation findings, risk stratification, comorbidities and participant's personal goals.    Expected Outcomes Short Term: Increase workloads from initial exercise prescription for resistance, speed, and METs.;Short Term: Perform resistance training exercises routinely during rehab and add in resistance training at home;Long Term: Improve cardiorespiratory fitness, muscular endurance and strength as measured by increased METs and functional capacity (6MWT) Short Term: Increase workloads from initial exercise prescription for resistance, speed, and METs.;Short Term: Perform resistance training exercises routinely during rehab and add in resistance training at home;Long Term: Improve cardiorespiratory fitness, muscular endurance and strength as measured by increased METs and functional capacity (6MWT) Short Term: Increase  workloads from initial exercise prescription for resistance, speed, and METs.;Short Term: Perform resistance training exercises routinely during rehab and add in resistance training at home;Long Term: Improve cardiorespiratory fitness, muscular endurance and strength as measured by increased METs and functional capacity (6MWT) Short Term: Increase workloads from initial exercise prescription for resistance, speed, and METs.;Short Term: Perform resistance training exercises routinely during rehab and add in resistance training at home;Long Term: Improve cardiorespiratory fitness, muscular endurance and strength as measured by increased METs and functional capacity (6MWT)    Able to understand and use rate of perceived exertion (RPE) scale Yes Yes Yes Yes    Intervention Provide education and explanation on how to use RPE scale Provide education and explanation on how to use RPE scale Provide education and explanation on how to use RPE scale Provide education and explanation on how to use RPE scale    Expected Outcomes Short Term: Able to use RPE daily in rehab to express subjective intensity level;Long Term:  Able to use RPE to guide intensity level when exercising independently Short Term: Able to use RPE daily in rehab to express subjective intensity level;Long Term:  Able to use RPE to guide intensity level when exercising independently Short Term: Able to use RPE daily in rehab to express subjective intensity level;Long Term:  Able to use RPE to guide intensity level when exercising independently Short Term: Able to use RPE daily in rehab to express subjective intensity level;Long Term:  Able to use RPE to guide intensity level when exercising independently    Knowledge and understanding of Target Heart Rate Range (THRR) Yes Yes Yes Yes    Intervention Provide education and explanation of THRR including how the numbers were predicted and where they are located for reference Provide education and explanation  of THRR including how the numbers were predicted and where they are located for reference Provide education and explanation of THRR including how the numbers were predicted and where they are located for reference  Provide education and explanation of THRR including how the numbers were predicted and where they are located for reference    Expected Outcomes Short Term: Able to state/look up THRR;Long Term: Able to use THRR to govern intensity when exercising independently;Short Term: Able to use daily as guideline for intensity in rehab Short Term: Able to state/look up THRR;Long Term: Able to use THRR to govern intensity when exercising independently;Short Term: Able to use daily as guideline for intensity in rehab Short Term: Able to state/look up THRR;Long Term: Able to use THRR to govern intensity when exercising independently;Short Term: Able to use daily as guideline for intensity in rehab Short Term: Able to state/look up THRR;Long Term: Able to use THRR to govern intensity when exercising independently;Short Term: Able to use daily as guideline for intensity in rehab    Able to check pulse independently Yes Yes Yes Yes    Intervention Provide education and demonstration on how to check pulse in carotid and radial arteries.;Review the importance of being able to check your own pulse for safety during independent exercise Provide education and demonstration on how to check pulse in carotid and radial arteries.;Review the importance of being able to check your own pulse for safety during independent exercise Provide education and demonstration on how to check pulse in carotid and radial arteries.;Review the importance of being able to check your own pulse for safety during independent exercise Provide education and demonstration on how to check pulse in carotid and radial arteries.;Review the importance of being able to check your own pulse for safety during independent exercise    Expected Outcomes Short  Term: Able to explain why pulse checking is important during independent exercise;Long Term: Able to check pulse independently and accurately Short Term: Able to explain why pulse checking is important during independent exercise;Long Term: Able to check pulse independently and accurately Short Term: Able to explain why pulse checking is important during independent exercise;Long Term: Able to check pulse independently and accurately Short Term: Able to explain why pulse checking is important during independent exercise;Long Term: Able to check pulse independently and accurately    Understanding of Exercise Prescription Yes Yes Yes Yes    Intervention Provide education, explanation, and written materials on patient's individual exercise prescription Provide education, explanation, and written materials on patient's individual exercise prescription Provide education, explanation, and written materials on patient's individual exercise prescription Provide education, explanation, and written materials on patient's individual exercise prescription    Expected Outcomes Short Term: Able to explain program exercise prescription;Long Term: Able to explain home exercise prescription to exercise independently Short Term: Able to explain program exercise prescription;Long Term: Able to explain home exercise prescription to exercise independently Short Term: Able to explain program exercise prescription;Long Term: Able to explain home exercise prescription to exercise independently Short Term: Able to explain program exercise prescription;Long Term: Able to explain home exercise prescription to exercise independently             Exercise Goals Re-Evaluation:  Exercise Goals Re-Evaluation     Row Name 06/09/21 1127 07/07/21 1002 08/05/21 0958         Exercise Goal Re-Evaluation   Exercise Goals Review Increase Physical Activity;Increase Strength and Stamina;Able to understand and use rate of perceived exertion  (RPE) scale;Knowledge and understanding of Target Heart Rate Range (THRR);Able to check pulse independently;Understanding of Exercise Prescription Increase Physical Activity;Increase Strength and Stamina;Able to understand and use rate of perceived exertion (RPE) scale;Knowledge and understanding of Target Heart Rate Range (  THRR);Able to check pulse independently;Understanding of Exercise Prescription Increase Physical Activity;Increase Strength and Stamina;Able to understand and use rate of perceived exertion (RPE) scale;Knowledge and understanding of Target Heart Rate Range (THRR);Able to check pulse independently;Understanding of Exercise Prescription     Comments Pt has attended 5 sessions of cardiac rehab. He is motivated to increase his workloads. His BPs have been elevated and his doctors have been notified. He has tolerated exercise well so far. He is currently exercising at 2.18 METs on the stepper. Will continue to monitor and progress as able. Pt has attended 15 sessions of cardiac rehab. He continues to progress his workloads and is eager to exercise. He has been stressed lately, as a lymph node in his chest has significantly grown over the past few months. He is scheduled for a biopsy at the end of the week. It is unclear how much time he will miss after this. He is currently exercising at 2.41 METs on the stepper. Will continue to monitor and progress as able. Pt has completed 20 sessions of cardiac rehab. He continues to progress his workloads and enjoys being here. He has had to miss several sessions due to having a lymph node biopsy and having trouble passing a kidney stone. He is currently exercising at 2.86 METs on the stepper. Will continue to monitor and progress as able.     Expected Outcomes Through exercise at rehab and at home, the patient will meet their stated goals. Through exercise at rehab and at home, the patient will meet their stated goals. Through exercise at rehab and at home,  the patient will meet their stated goals.              Nutrition & Weight - Outcomes:  Pre Biometrics - 05/30/21 1501       Pre Biometrics   Height 6' (1.829 m)    Waist Circumference 57 inches    Hip Circumference 51 inches    Waist to Hip Ratio 1.12 %    Triceps Skinfold 27 mm    % Body Fat 42.4 %    Grip Strength 57.9 kg    Flexibility 0 in    Single Leg Stand 27.64 seconds             Post Biometrics - 08/22/21 0914        Post  Biometrics   Height 6' (1.829 m)    Weight 144.3 kg    Waist Circumference 56 inches    Hip Circumference 50 inches    Waist to Hip Ratio 1.12 %    BMI (Calculated) 43.14    Triceps Skinfold 20 mm    % Body Fat 40.6 %    Grip Strength 47.3 kg    Flexibility 0 in    Single Leg Stand 45.17 seconds             Nutrition:  Nutrition Therapy & Goals - 06/02/21 1415       Personal Nutrition Goals   Comments Patient scored 50 on his diet assessment. We offer 2 educational sessions on heart healthy nutrition with handouts and offer assistance with RD referral if patient is interested.      Intervention Plan   Intervention Nutrition handout(s) given to patient.             Nutrition Discharge:  Nutrition Assessments - 08/26/21 0824       MEDFICTS Scores   Pre Score 50    Post Score 18  Score Difference -32             Education Questionnaire Score:  Knowledge Questionnaire Score - 08/26/21 0824       Knowledge Questionnaire Score   Pre Score 20/24    Post Score 22/24             Goals reviewed with patient; copy given to patient. Pt graduated on 9/30 after 28 sessions of cardiac rehab. He had good attendance throughout, only missing for medical reasons. His weight was stable while he was in the program, but he reports that he has greatly improved his eating habits. Dr. Virgina Jock adjusted some of his medications and his BP improved significantly. He was able to increase his six minute walk test  distance by 200 feet. His MET level was 3.55 upon graduation. He reports that he will continue to exercise by walking at his house.

## 2021-08-27 LAB — ACID FAST CULTURE WITH REFLEXED SENSITIVITIES (MYCOBACTERIA): Acid Fast Culture: NEGATIVE

## 2021-09-03 ENCOUNTER — Telehealth: Payer: Self-pay

## 2021-09-03 NOTE — Telephone Encounter (Signed)
Second attempt to contact patient about the [employer] Lpa study with no answer. Message left asking patient to return call

## 2021-09-19 ENCOUNTER — Other Ambulatory Visit (HOSPITAL_COMMUNITY): Payer: Self-pay

## 2021-09-26 ENCOUNTER — Ambulatory Visit: Payer: Managed Care, Other (non HMO) | Admitting: Cardiology

## 2021-10-09 ENCOUNTER — Ambulatory Visit: Payer: Managed Care, Other (non HMO) | Admitting: Cardiology

## 2021-10-09 ENCOUNTER — Encounter: Payer: Self-pay | Admitting: Cardiology

## 2021-10-09 ENCOUNTER — Other Ambulatory Visit: Payer: Self-pay

## 2021-10-09 VITALS — BP 135/91 | HR 101 | Temp 97.8°F | Resp 16 | Ht 72.0 in | Wt 317.0 lb

## 2021-10-09 DIAGNOSIS — I2510 Atherosclerotic heart disease of native coronary artery without angina pectoris: Secondary | ICD-10-CM

## 2021-10-09 DIAGNOSIS — I4729 Other ventricular tachycardia: Secondary | ICD-10-CM

## 2021-10-09 MED ORDER — TICAGRELOR 90 MG PO TABS: 90.0000 mg | ORAL_TABLET | Freq: Two times a day (BID) | ORAL | 2 refills | Status: DC

## 2021-10-09 NOTE — Progress Notes (Signed)
Follow up visit Virtual visit  Subjective:   Zachary Weeks, male    DOB: June 02, 1981, 40 y.o.   MRN: [id number]   HPI  Chief Complaint  Patient presents with   Coronary Artery Disease   Follow-up    3 month    40 y.o. 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  Patient is doing well, denies chest pain. 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 He was asked to reduce weight before starting on steroids.   Patient was seen in [facility] emergency department on 06/16/2021 with complaints of chest pain.  EKG showed no ischemia.  Chest x-ray showed no acute abnormalities.  High-sensitivity troponin was 87, negative.  Patient also underwent CT angiogram that showed no PE.  CT chest redemonstrated mediastinal and bilateral hilar lymphadenopathy with slight increase in size of subcarinal nodal conglomeration, but otherwise stable.  Superficial mediastinal and hilar lymph nodes when compared to 2020 scan.  Patient is going to have a biopsy of one of his lymph nodes by Dr. Roxan Hockey soon.  Has been working diligently with cardiac rehab.  Denies any chest pain or more than usual shortness of breath symptoms.  He had 1 episode of 6 beat nonsustained VT during cardiac rehab, not associated with any presyncope or syncope.  Recent lab results reviewed with the patient, details below.  Patient is doing well since discharge. He only has occasional right sided chest pain on breathing. He was walked at [place] for 20 min without chest pain. He is using CPAP at home regularly. O2 sats are still around 92-94%, with only occasional episodes of 89-90%. He is not using Dulera as recommended by pulmonology, as it has caused him side effects in the past.   On a separate note, he  reports that he has burning and tingling sensation in his left wrist where he had ABG performed.    Current Outpatient Medications on File Prior to Visit  Medication Sig Dispense Refill   albuterol (VENTOLIN HFA) 108 (90 Base) MCG/ACT inhaler Inhale 1-2 puffs into the lungs every 6 (six) hours as needed for wheezing or shortness of breath.     amLODipine (NORVASC) 5 MG tablet TAKE 1 TABLET BY MOUTH DAILY 30 tablet 2   aspirin (ASPIRIN LOW DOSE) 81 MG EC tablet Take 1 tablet (81 mg total) by mouth daily. TAKE 1 TABLET BY MOUTH DAILY swallow whole 60 tablet 2   atorvastatin (LIPITOR) 80 MG tablet Take 1 tablet (80 mg total) by mouth daily. 90 tablet 3   cholecalciferol (VITAMIN D) 25 MCG (1000 UNIT) tablet Take 1,000 Units by mouth daily.     cyclobenzaprine (FLEXERIL) 10 MG tablet Take 10 mg by mouth 3 (three) times daily as needed for muscle spasms.     metoprolol tartrate (LOPRESSOR) 25 MG tablet Take 1.5 tablets (37.5 mg total) by mouth 2 (two) times daily. 180 tablet 3   nitroGLYCERIN (NITROSTAT) 0.4 MG SL tablet Place 1 tablet (0.4 mg total) under the tongue every 5 (five) minutes as needed for chest pain. 30 tablet 1   pantoprazole (PROTONIX) 40 MG tablet Take 40 mg by mouth in the morning.     ticagrelor (BRILINTA) 90 MG TABS tablet Take 1 tablet (90 mg total) by mouth 2 (two) times daily. 180 tablet 2   No current  facility-administered medications on file prior to visit.    Cardiovascular & other pertient studies:  EKG 06/26/2021: Sinus rhythm 76 bpm Possible old anteroseptal infarct  CTA chest 06/16/2021: 1. No acute cardiopulmonary disease. Specifically, no evidence of pulmonary embolism. 2. 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 3. Borderline cardiomegaly, unchanged. 4. Minimal coronary artery calcifications. Aortic Atherosclerosis (ICD10-I70.0). 5. Suspected hepatic steatosis.  Correlation with LFTs is advised.    Echocardiogram 05/06/2021:  1. Left ventricular ejection fraction, by estimation, is 60 to 65%. The  left ventricle has normal function. The left ventricle has no regional  wall motion abnormalities. There is moderate asymmetric left ventricular  hypertrophy. Left ventricular  diastolic parameters were normal.   2. Right ventricular systolic function is normal. The right ventricular  size is normal.   3. No significant valvular abnormality.   4. The inferior vena cava is normal in size with greater than 50%  respiratory variability, suggesting right atrial pressure of 3 mmHg.   Coronary angiogram 05/05/2021: LM:  Very large. Normal LAD: Very large. 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 Normal RCA: Very large. Difficult to opacify distally, but probably normal.   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 No treatable lesion identified. Recommend DAPT with Aspirin and brilinta.   EKG 05/04/2021: Sinus tachycardia 114 bpm No acute ischemic changes     Recent labs: 09/23/2021: Glucose 137, BUN/Cr 14/1.55. EGFR 57. Na/K 139/3.6. Rest of the CMP normal H/H 15/47. MCV 86. Platelets 233 HbA1C 6.1% (08/2021)   06/16/2021: Glucose 129, BUN/Cr 11/1.37. EGFR >60. Na/K 136/3.7. Rest of the CMP normal Trop HS 8.7 H/H 16/50. MCV 86. Platelets 209 Chol 152, TG 130, HDL 36, LDL 93   05/07/2021: Glucose 128, BUN/Cr 20/1.49. EGFR 60. Na/K 137/3.9.  H/H 17/52. MCV 87. Platelets 178 HbA1C 6.6% Chol 158, TG 227, HDL 32, LDL 81 TSH 3.7 normal   Review of Systems  Cardiovascular:  Positive for dyspnea on exertion (Improved). Negative for chest pain (Right sided, occasional), leg swelling, palpitations and syncope.   Neurological:  Positive for paresthesias.       Today's Vitals   10/09/21 1056 10/09/21 1059  BP: (!) 141/92 (!) 135/91  Pulse: (!) 104 (!) 101  Resp: 16   Temp: 97.8 F (36.6 C)   SpO2: 94%   Weight: (!) 317 lb (143.8 kg)   Height: 6' (1.829 m)    Body mass index is 42.99 kg/m.     Body mass index is 42.99 kg/m. Filed Weights   10/09/21 1056  Weight: (!) 317 lb (143.8 kg)    Objective:   Physical Exam Vitals and nursing note reviewed.  Constitutional:      General: He is not in acute distress.    Appearance: He is well-developed. He is obese.  Pulmonary:     Effort: Pulmonary effort is normal.  Musculoskeletal:     Right lower leg: No edema.     Left lower leg: No edema.  Neurological:     Mental Status: He is alert and oriented to person, place, and time.          Assessment & Recommendations:   39 y.o. 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    NSTEMI: Nonobstructive CAD with aneurysmal coronaries Recommend DAPT for 1 year, statin, metoprolol. Check lipid panel, if not recently performed by PCP  NSVT: Controlled on metoprolol tartrate to 37.5 mg twice daily.  Mixed hyperlipidemia: On lipitor 80 mg daily. 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 F/u w/pulmonology.   H/o DVT: In 2020 treated with ? 6 months of anticoagulation He has 3 family members on maternal side with with h/o lupus anticoagulant May need outpatient hematology consult. Defer to PCP.  F/u in 6 months    Nigel Mormon, MD Pager: [phone] Office: [phone]

## 2021-11-25 ENCOUNTER — Other Ambulatory Visit: Payer: Self-pay | Admitting: Cardiology

## 2021-12-01 ENCOUNTER — Other Ambulatory Visit: Payer: Self-pay

## 2021-12-01 ENCOUNTER — Ambulatory Visit (HOSPITAL_COMMUNITY)
Admission: RE | Admit: 2021-12-01 | Discharge: 2021-12-01 | Disposition: A | Discharge status: Home / Self Care | Payer: Managed Care, Other (non HMO) | Source: Ambulatory Visit | Attending: Pulmonary Disease | Admitting: Pulmonary Disease

## 2021-12-01 DIAGNOSIS — R0602 Shortness of breath: Secondary | ICD-10-CM | POA: Diagnosis present

## 2021-12-03 ENCOUNTER — Encounter: Payer: Self-pay | Admitting: *Deleted

## 2021-12-03 NOTE — Progress Notes (Signed)
Letter mailed

## 2021-12-09 ENCOUNTER — Encounter: Payer: Self-pay | Admitting: Pulmonary Disease

## 2021-12-09 ENCOUNTER — Ambulatory Visit: Payer: Managed Care, Other (non HMO) | Admitting: Pulmonary Disease

## 2021-12-09 ENCOUNTER — Other Ambulatory Visit: Payer: Self-pay

## 2021-12-09 DIAGNOSIS — D869 Sarcoidosis, unspecified: Secondary | ICD-10-CM | POA: Diagnosis not present

## 2021-12-09 DIAGNOSIS — G4733 Obstructive sleep apnea (adult) (pediatric): Secondary | ICD-10-CM

## 2021-12-09 NOTE — Progress Notes (Signed)
° °  Subjective:    Patient ID: Zachary Weeks, male    DOB: Nov 15, 1981, 41 y.o.   MRN: [id number]  HPI  41 yo obese Man  for FU of mediastinal and hilar lymphadenopathy (first noted 10/2019 ) & OSA  Mediastinoscopy lymph node biopsy >> noncaseating granulomas.  Special stains were negative for AFB and fungus   PMH - RLE DVT in 10/2019   Underwent hematology evaluation and detailed hypercoagulable work-up which was mostly negative except PCR-ABL positive .  bone marrow biopsy 02/2020 neg 04/2021 NSTEMI, hosp adm for hypoxia   Family history of pulmonary fibrosis in his mother  35-monthfollow-up visit.  Breathing is stable.  He has received a new CPAP device/loaner from [employer].  Weight is stable at 316 pounds He prefers not to get the flu vaccine.  He has received COVID-vaccine and booster.  He had COVID infection in October but did not get sick, only had a cold.  Chest x-ray 1/10 was reviewed which shows clear lungs without infiltrates, no significant lymphadenopathy noted   Significant tests/ events reviewed  PFTs 06/2021 >> FVC 80%, no airway obs, TLC 84% , DLCO nml   HST 11/2020 [facility] severe OSA TBNA 10/2020 neg, scant lymphoid tissue   10/18/20 Ambulatory saturation-oxygen saturation was 92% on room air at rest and desaturated 91% on walking    04/2021 CT angiogram chest with diffuse nonspecific mediastinal and hilar adenopathy, minimal scarring in the right middle lobe, dependent atelectasis focal subpleural opacity in the right lower lobe likely additional atelectasis or scarring   10/2019 CT angio chest >> Mild mediastinal and bilateral hilar adenopathy, Prevascular lymph node has a short axis diameter of 14 mm. Subcarinal lymph node has a short axis diameter of 19 mm. Right paratracheal lymph node has a short axis diameter of 12 mm. No axillary adenopathy. 5 mm right lower lobe peripheral nodule   PET 04/2020 >> Mediastinal and bilateral hilar lymphadenopathy with associated increased  metabolic activity, favored to represent benign, reactive changes.   CT chest W con 08/2020 Unchanged enlarged mediastinal and bilateral hilar lymph nodes,  which remain nonspecific   Autoimmune Work up from [employer]2021  Rheumatoid factor was negative quantifiron  negative ANA was negative angiotensin converting enzyme was negative anti-SSA antibody and anti-SSB antibody were negative ESR was 17 mm/hr factor V Leiden test was negative  Review of Systems neg for any significant sore throat, dysphagia, itching, sneezing, nasal congestion or excess/ purulent secretions, fever, chills, sweats, unintended wt loss, pleuritic or exertional cp, hempoptysis, orthopnea pnd or change in chronic leg swelling. Also denies presyncope, palpitations, heartburn, abdominal pain, nausea, vomiting, diarrhea or change in bowel or urinary habits, dysuria,hematuria, rash, arthralgias, visual complaints, headache, numbness weakness or ataxia.     Objective:   Physical Exam  Gen. Pleasant, obese, in no distress ENT - no lesions, no post nasal drip Neck: No JVD, no thyromegaly, no carotid bruits Lungs: no use of accessory muscles, no dullness to percussion, decreased without rales or rhonchi  Cardiovascular: Rhythm regular, heart sounds  normal, no murmurs or gallops, no peripheral edema Musculoskeletal: No deformities, no cyanosis or clubbing , no tremors       Assessment & Plan:

## 2021-12-09 NOTE — Assessment & Plan Note (Signed)
We will obtain download from his CPAP and review and change settings as needed overall he seems to be resting well denies daytime sleepiness and is compliant by report  Weight loss encouraged, compliance with goal of at least 4-6 hrs every night is the expectation. Advised against medications with sedative side effects Cautioned against driving when sleepy - understanding that sleepiness will vary on a day to day basis

## 2021-12-09 NOTE — Assessment & Plan Note (Signed)
Appears stable chest x-ray without worsening. PFTs did not show significant restriction, so does not need treatment from a lung standpoint. He is scheduled to see ophthalmology

## 2021-12-09 NOTE — Patient Instructions (Signed)
°  X CPAP download - Zachary Weeks

## 2022-01-20 IMAGING — CR DG CHEST 2V
2 series · 2 of 2 positions shown · non-contrast
Comparison: June 16, 2021.

CLINICAL DATA: Preop for mediastinal lymphadenopathy.

EXAM:
CHEST - 2 VIEW

[w chest pa]
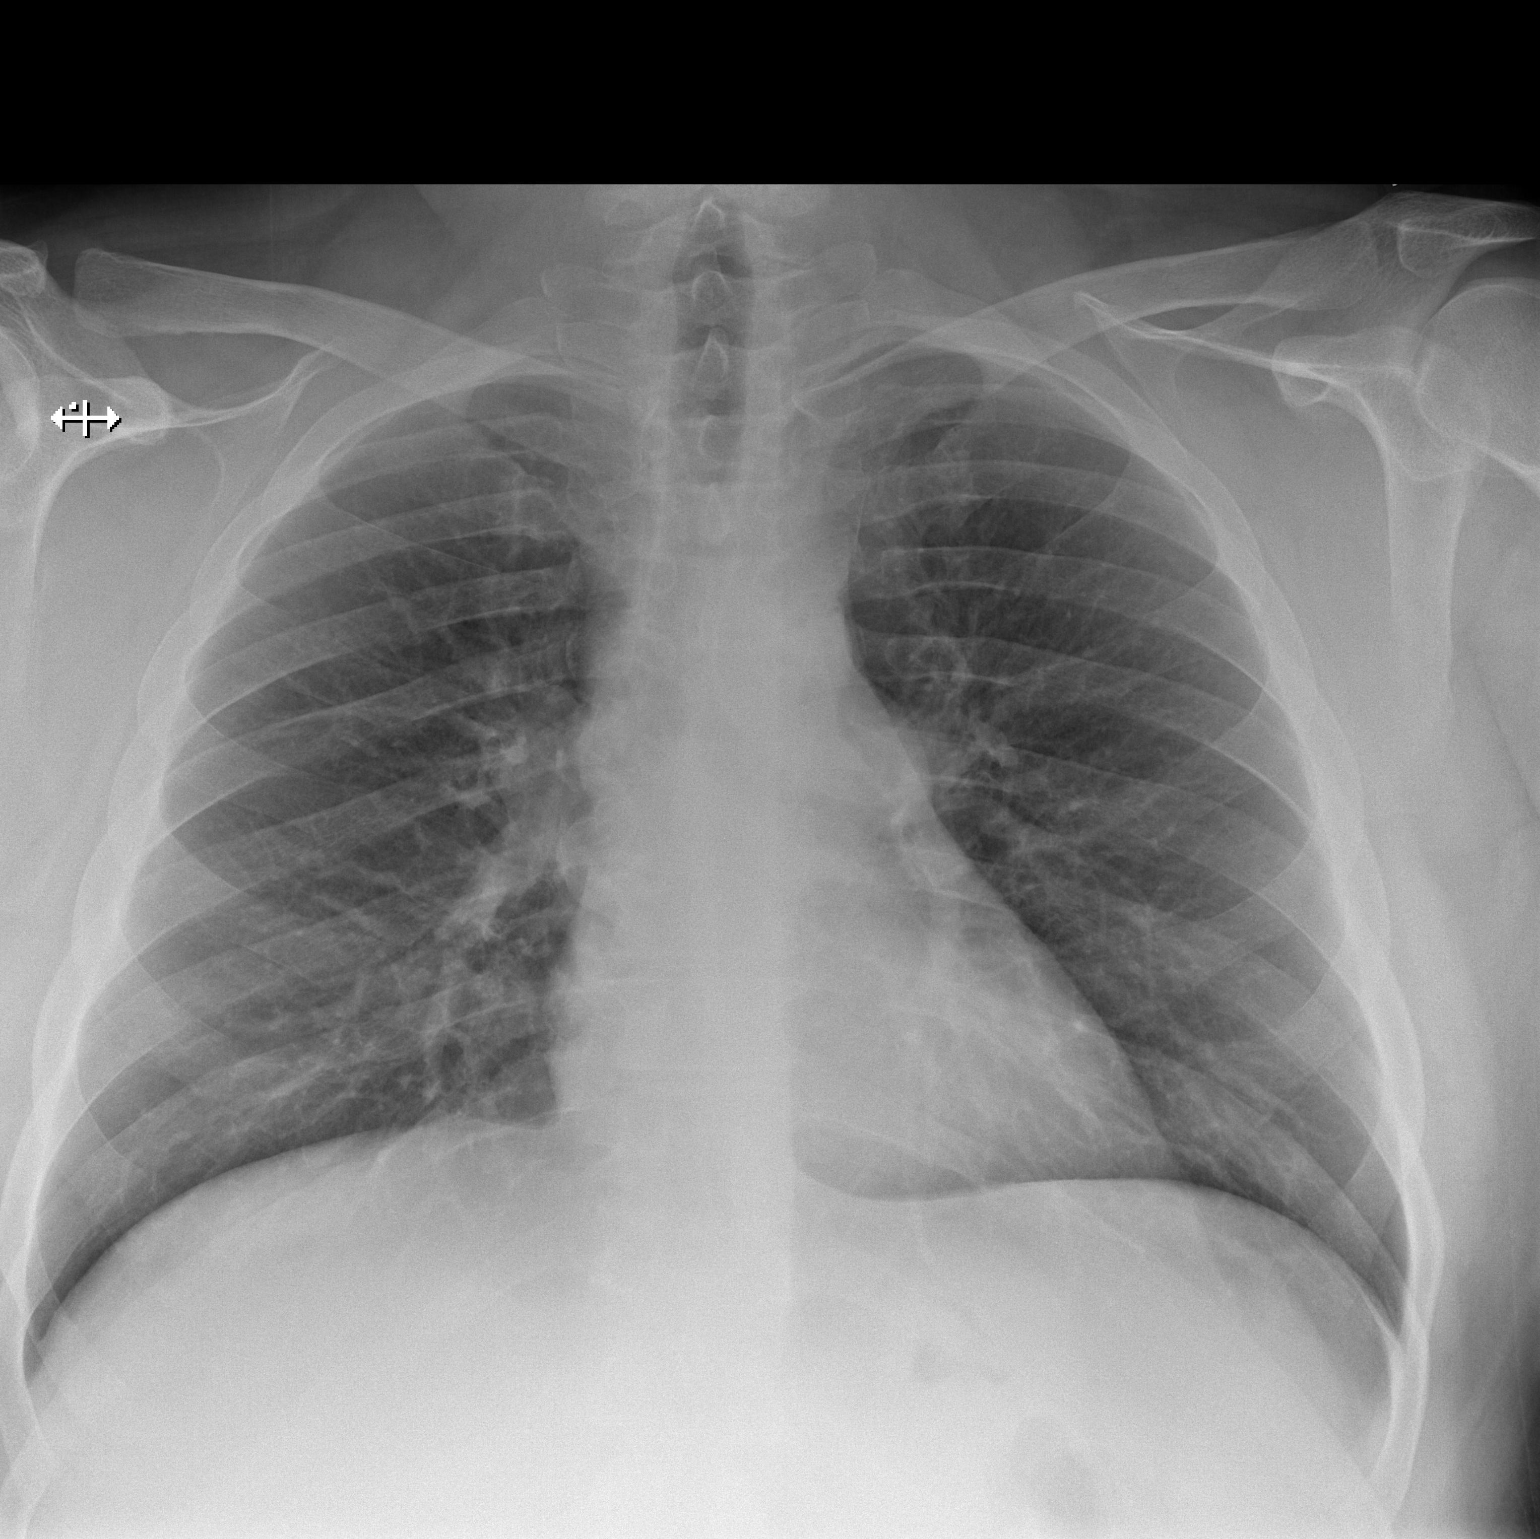

[w chest lat]
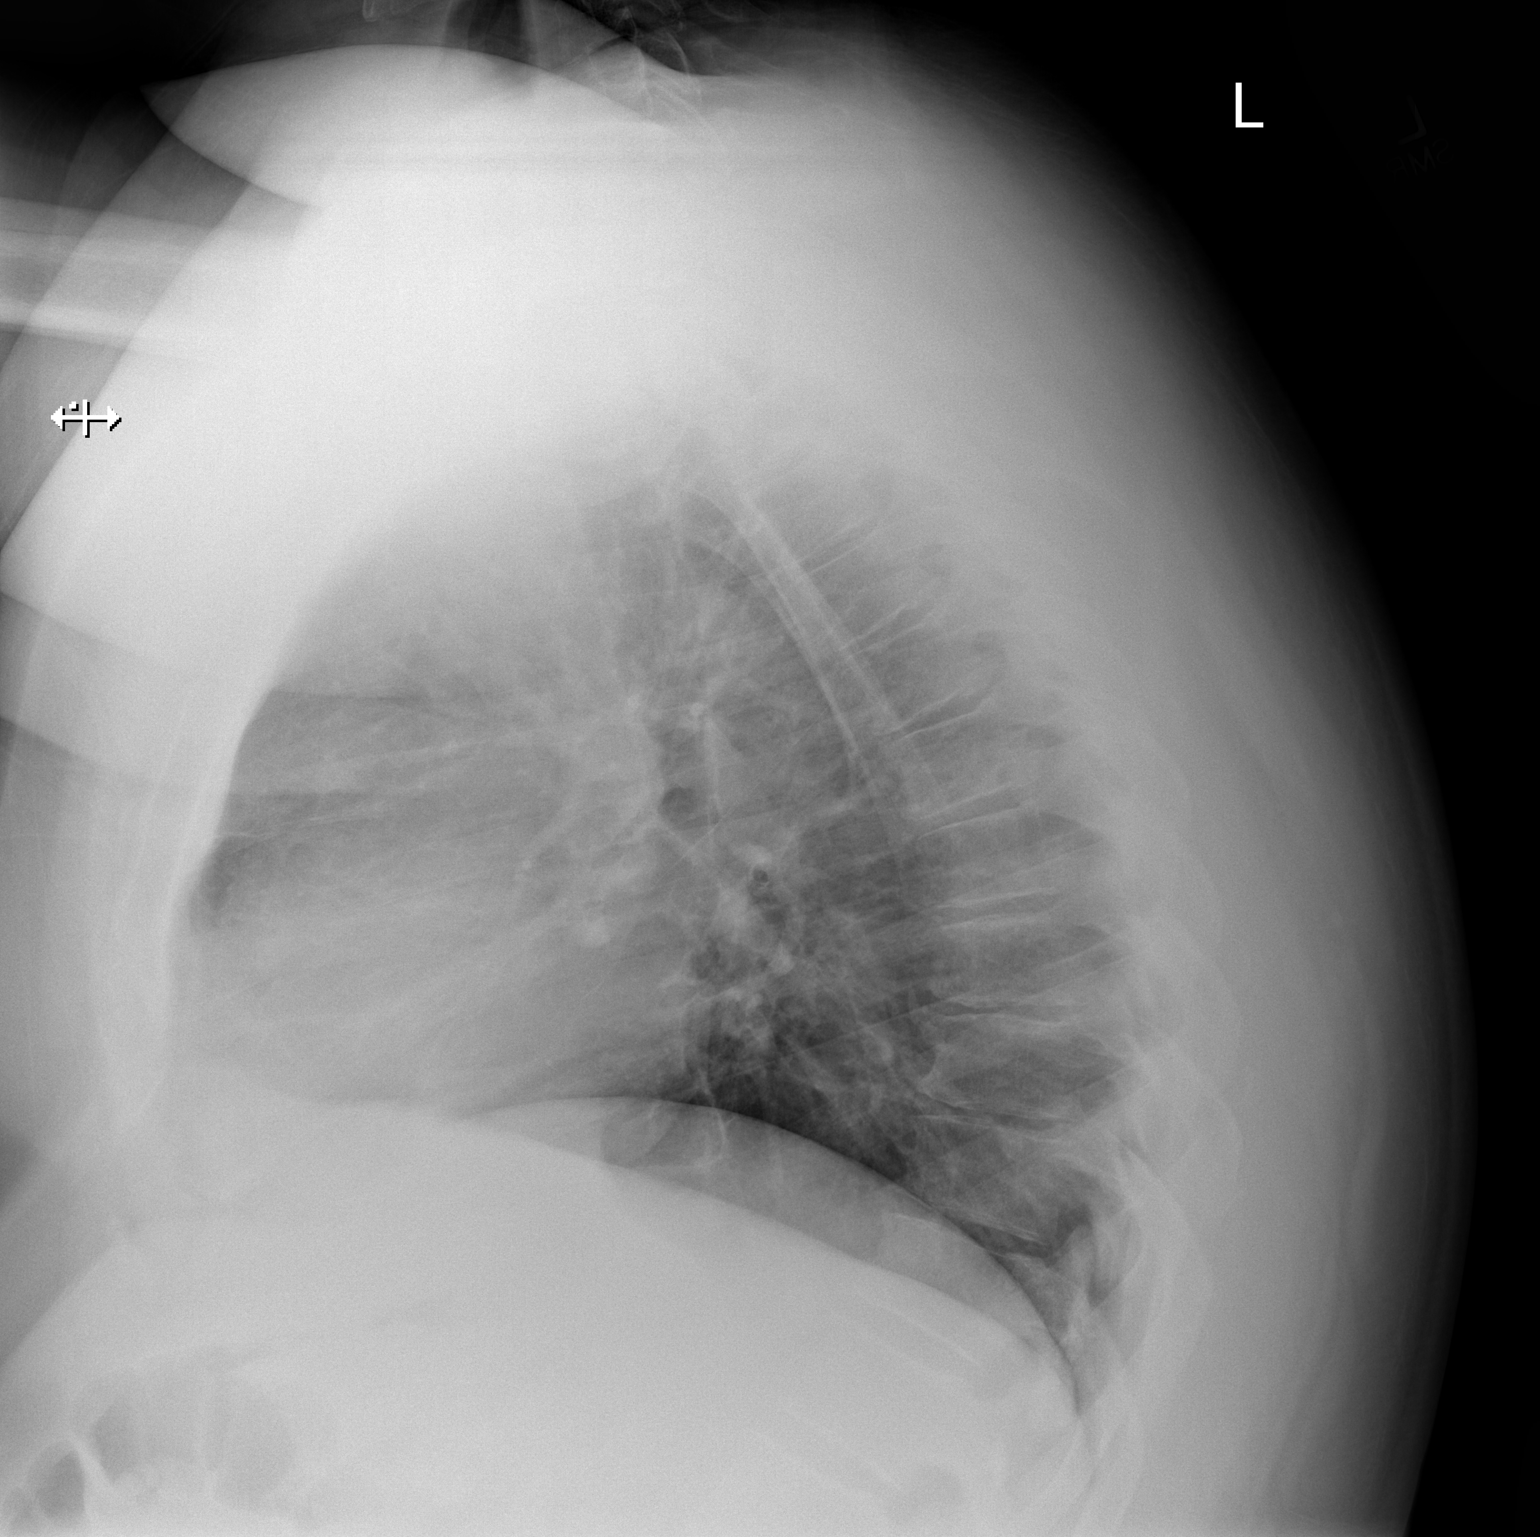

[2 of 2 positions shown; findings below may reference images not displayed]

FINDINGS: The heart size and mediastinal contours are within normal limits.
Both lungs are clear. The visualized skeletal structures are
unremarkable.
IMPRESSION: No active cardiopulmonary disease.

## 2022-02-04 IMAGING — CT CT ABD-PELV W/O CM
2 of 4 series · 16 of 46 positions shown, 18 images · non-contrast
Comparison: CT of the abdomen and pelvis 05/05/2021.

CLINICAL DATA: 40-year-old male with history of left-sided flank
pain and left lower quadrant abdominal pain with hematuria since
this morning.

EXAM:
CT ABDOMEN AND PELVIS WITHOUT CONTRAST
TECHNIQUE: Multidetector CT imaging of the abdomen and pelvis was performed
following the standard protocol without IV contrast.

[Series 2: axial st · axial · 0.93mm/px · z∈[+636,+1116]mm · 13 of 110 slices shown, 15 images]
[im 7/110  soft-tissue]
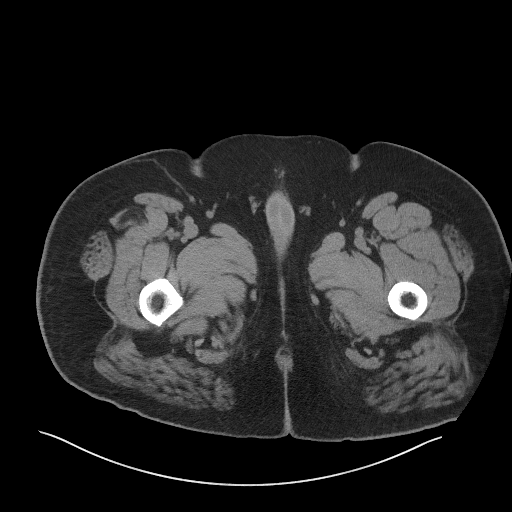
[im 7/110  bone]
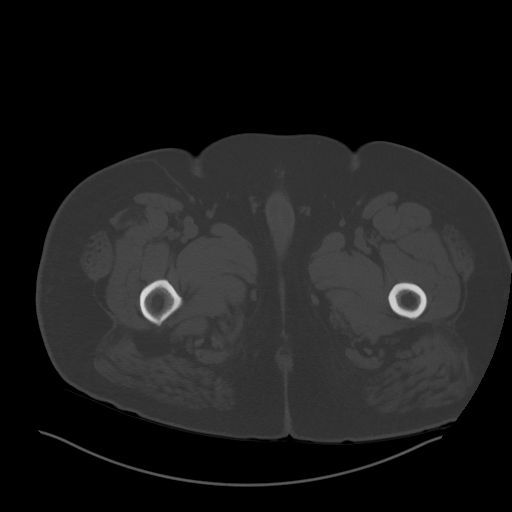
[im 14/110  soft-tissue]
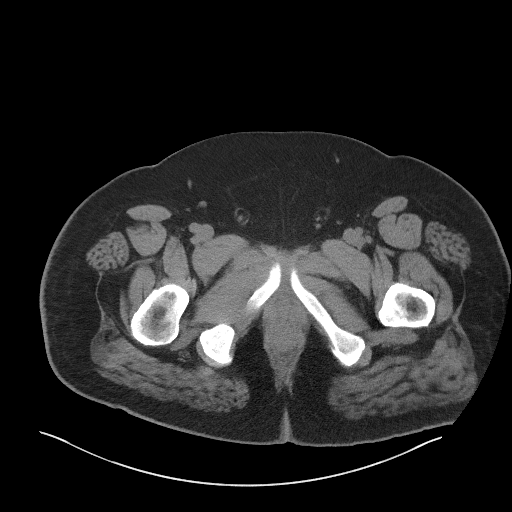
[im 21/110  soft-tissue]
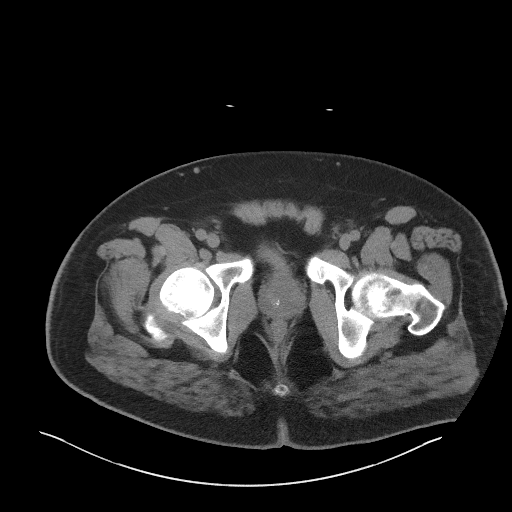
[im 35/110  soft-tissue]
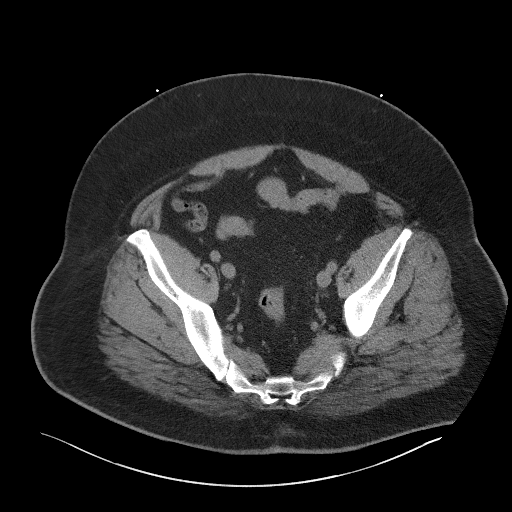
[im 41/110  soft-tissue]
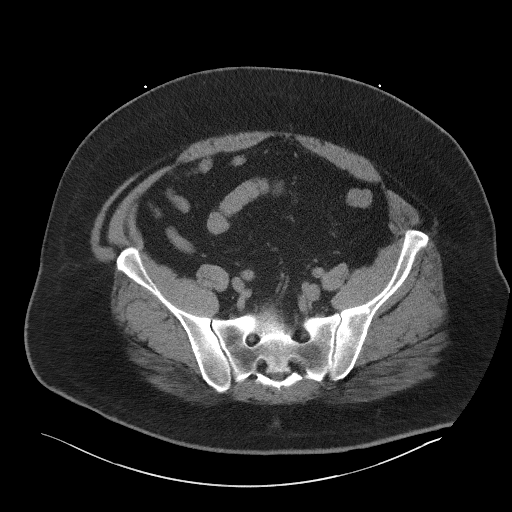
[im 48/110  soft-tissue]
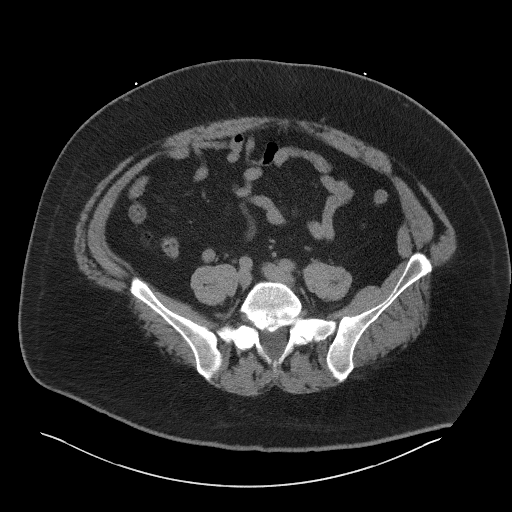
[im 55/110  soft-tissue]
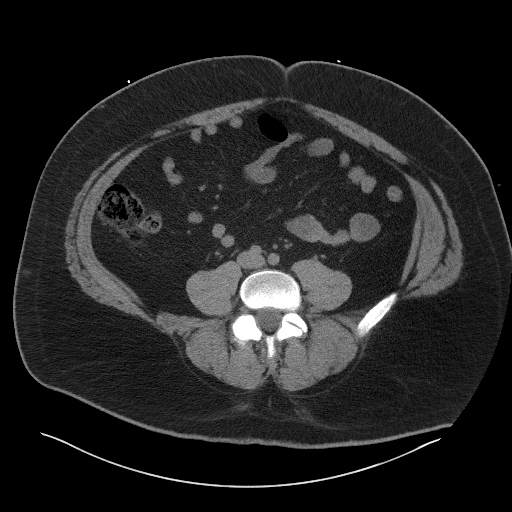
[im 62/110  soft-tissue]
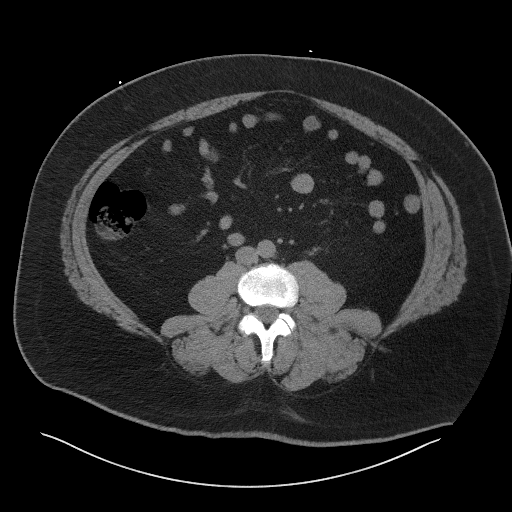
[im 69/110  soft-tissue]
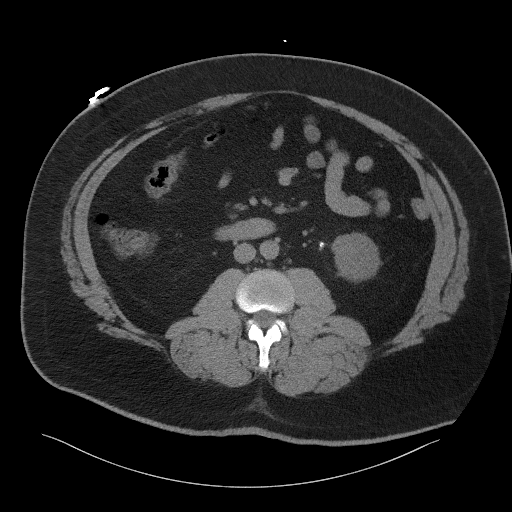
[im 69/110  bone]
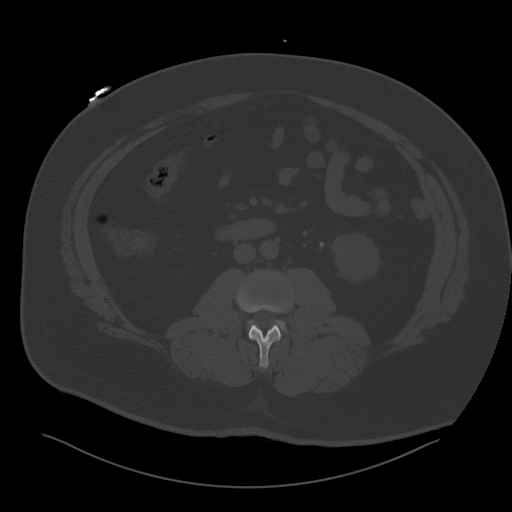
[im 75/110  soft-tissue]
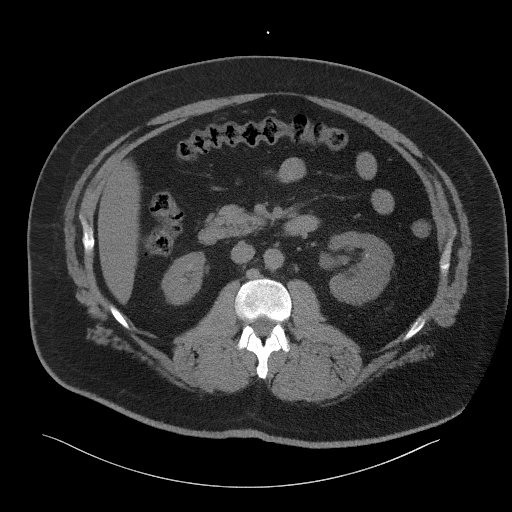
[im 89/110  soft-tissue]
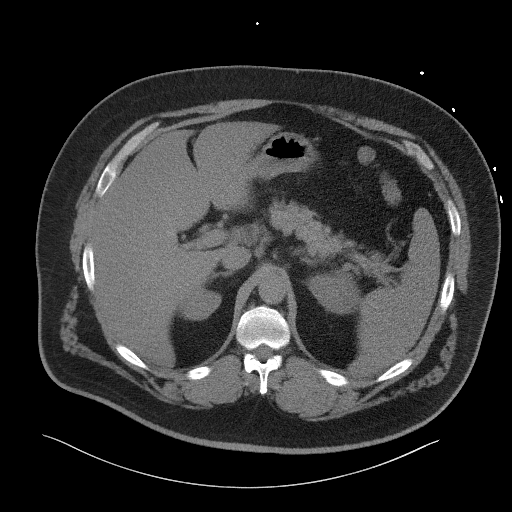
[im 96/110  soft-tissue]
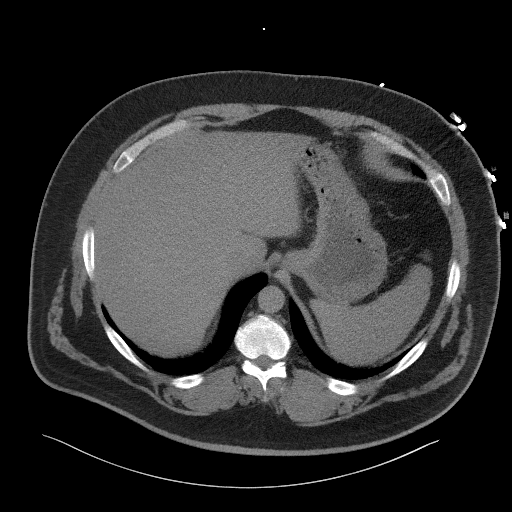
[im 103/110  soft-tissue]
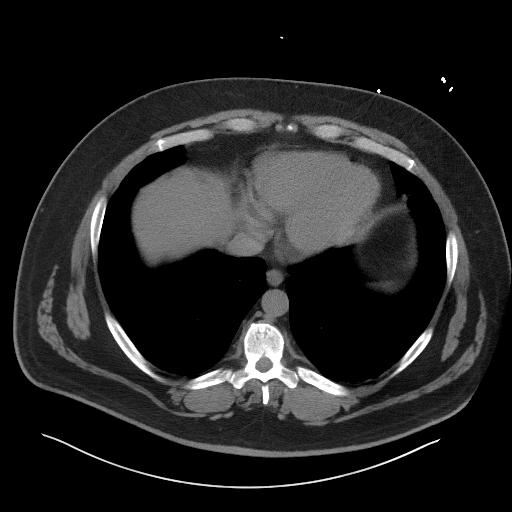

[Series 5: coronal st · coronal · 0.99mm/px · 3 of 129 slices shown]
[im 43/129  soft-tissue]
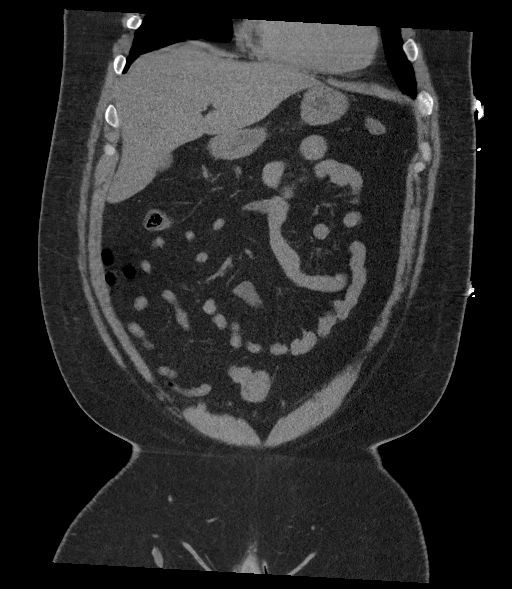
[im 57/129  soft-tissue]
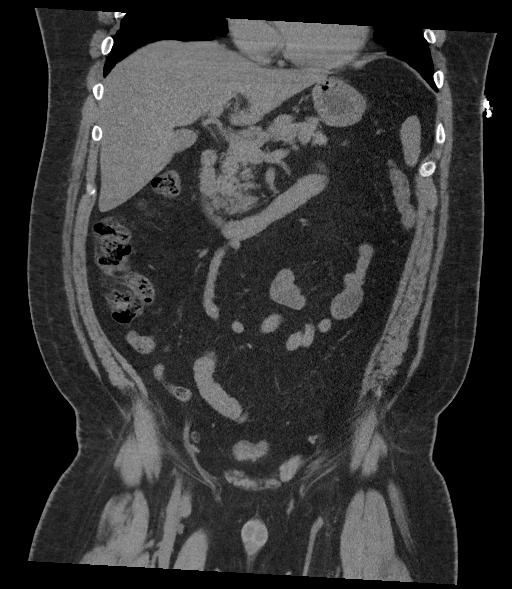
[im 72/129  soft-tissue]
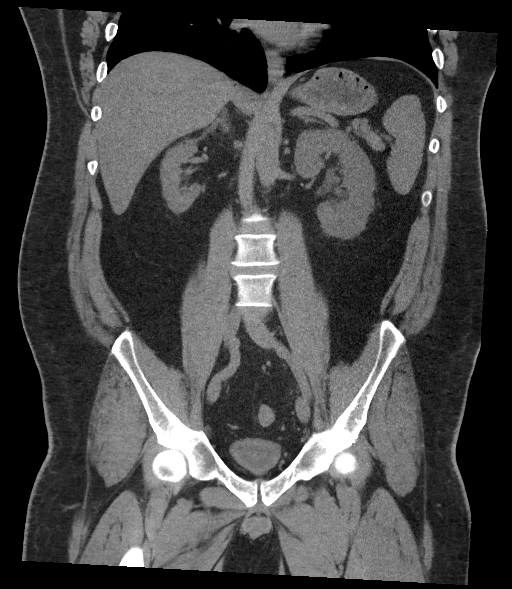

[16 of 46 positions shown; findings below may reference images not displayed]

FINDINGS: Lower chest: There is mild scarring in the lung bases bilaterally.

Hepatobiliary: Diffuse low attenuation throughout the hepatic
parenchyma, indicative of hepatic steatosis. Ill-defined
intermediate attenuation material lying dependently in the
gallbladder likely represents biliary sludge. No findings to suggest
an acute cholecystitis at this time.

Pancreas: No definite pancreatic mass or peripancreatic fluid
collections or inflammatory changes are noted on today's noncontrast
CT examination.

Spleen: Unremarkable.

Adrenals/Urinary Tract: Multiple nonobstructive calculi are noted
within the collecting systems of both kidneys measuring 1-4 mm in
size. In addition, at the left ureteropelvic junction (coronal image
63 of series 5) there is a 3 mm calculus. This is associated with
very mild proximal left hydronephrosis. No additional calculi are
noted along the course of the right ureter or within the lumen of
the urinary bladder. No right hydroureteronephrosis. Right kidney,
bilateral adrenal glands and urinary bladder are normal in
appearance.

Stomach/Bowel: The appearance of the stomach is normal. No
pathologic dilatation of small bowel or colon. Normal appendix.

Vascular/Lymphatic: Aortic atherosclerosis. No lymphadenopathy noted
in the abdomen or pelvis.

Reproductive: The prostate gland and seminal vesicles are
unremarkable in appearance.

Other: No significant volume of ascites.  No pneumoperitoneum.

Musculoskeletal: There are no aggressive appearing lytic or blastic
lesions noted in the visualized portions of the skeleton.
IMPRESSION: 1. 3 mm calculus in the left ureteropelvic junction with mild
proximal left hydronephrosis indicating mild obstruction.
2. Multiple additional nonobstructive calculi are noted within the
collecting systems of both kidneys measuring 1-4 mm in size.
3. Hepatic steatosis.
4. Aortic atherosclerosis.

## 2022-02-26 ENCOUNTER — Other Ambulatory Visit: Payer: Self-pay | Admitting: Cardiology

## 2022-03-26 ENCOUNTER — Other Ambulatory Visit: Payer: Self-pay | Admitting: Cardiology

## 2022-04-29 ENCOUNTER — Ambulatory Visit: Payer: Managed Care, Other (non HMO) | Admitting: Cardiology

## 2022-04-29 ENCOUNTER — Encounter: Payer: Self-pay | Admitting: Cardiology

## 2022-04-29 VITALS — BP 128/72 | HR 82 | Temp 98.1°F | Resp 16 | Ht 72.0 in | Wt 325.0 lb

## 2022-04-29 DIAGNOSIS — I2510 Atherosclerotic heart disease of native coronary artery without angina pectoris: Secondary | ICD-10-CM

## 2022-04-29 DIAGNOSIS — R590 Localized enlarged lymph nodes: Secondary | ICD-10-CM

## 2022-04-29 DIAGNOSIS — I4729 Other ventricular tachycardia: Secondary | ICD-10-CM

## 2022-04-29 NOTE — Progress Notes (Signed)
Follow up visit Virtual visit  Subjective:   Zachary Weeks, male    DOB: [birth year], 41 y.o.   MRN: [id number]   HPI  Chief Complaint  Patient presents with   Coronary Artery Disease   Follow-up    6 months    41 y.o. 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  Patient is doing well, denies chest pain. Blood pressure is well controlled. Reportedly, his oncologist told him that he is not on eliquis for his prior DVT due to the fact that he is on DAPT.    Current Outpatient Medications:    albuterol (VENTOLIN HFA) 108 (90 Base) MCG/ACT inhaler, Inhale 1-2 puffs into the lungs every 6 (six) hours as needed for wheezing or shortness of breath., Disp: , Rfl:    amLODipine (NORVASC) 5 MG tablet, TAKE 1 TABLET BY MOUTH DAILY, Disp: 30 tablet, Rfl: 2   ASPIRIN LOW DOSE 81 MG EC tablet, TAKE 1 TABLET BY MOUTH DAILY, Disp: 60 tablet, Rfl: 2   atorvastatin (LIPITOR) 80 MG tablet, Take 1 tablet (80 mg total) by mouth daily., Disp: 90 tablet, Rfl: 3   cholecalciferol (VITAMIN D) 25 MCG (1000 UNIT) tablet, Take 1,000 Units by mouth daily., Disp: , Rfl:    cyclobenzaprine (FLEXERIL) 10 MG tablet, Take 10 mg by mouth 3 (three) times daily as needed for muscle spasms., Disp: , Rfl:    metoprolol tartrate (LOPRESSOR) 25 MG tablet, TAKE 1 AND 1/2 TABLETS BY MOUTH TWICE DAILY, Disp: 180 tablet, Rfl: 3   nitroGLYCERIN (NITROSTAT) 0.4 MG SL tablet, Place 1 tablet (0.4 mg total) under the tongue every 5 (five) minutes as needed for chest pain., Disp: 30 tablet, Rfl: 1   pantoprazole (PROTONIX) 40 MG tablet, Take 40 mg by mouth in the morning., Disp: , Rfl:    ticagrelor (BRILINTA) 90 MG TABS tablet, Take 1 tablet (90 mg total) by mouth 2 (two) times daily., Disp: 180 tablet, Rfl: 2  Cardiovascular & other pertient studies:  EKG  06/26/2021: Sinus rhythm 76 bpm Possible old anteroseptal infarct  CTA chest 06/16/2021: 1. No acute cardiopulmonary disease. Specifically, no evidence of pulmonary embolism. 2. 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 3. Borderline cardiomegaly, unchanged. 4. Minimal coronary artery calcifications. Aortic Atherosclerosis (ICD10-I70.0). 5. Suspected hepatic steatosis.  Correlation with LFTs is advised.    Echocardiogram 05/06/2021:  1. Left ventricular ejection fraction, by estimation, is 60 to 65%. The  left ventricle has normal function. The left ventricle has no regional  wall motion abnormalities. There is moderate asymmetric left ventricular  hypertrophy. Left ventricular  diastolic parameters were normal.   2. Right ventricular systolic function is normal. The right ventricular  size is normal.   3. No significant valvular abnormality.   4. The inferior vena cava is normal in size with greater than 50%  respiratory variability, suggesting right atrial pressure of 3 mmHg.   Coronary angiogram 05/05/2021: LM:  Very large. Normal LAD: Very large. 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 Normal RCA: Very large. Difficult to opacify distally, but probably normal.   I suspect he has aneurysmal vessels with plaque erosion on prox  LAD being the culprit. No treatable lesion identified. Recommend DAPT with Aspirin and brilinta.   EKG 05/04/2021: Sinus tachycardia 114 bpm No acute ischemic changes     Recent labs: 03/24/2022: Glucose 116, BUN/Cr 15/1.25. EGFR 73. Na/K 139/4.1. Rest of the CMP normal H/H 15/45. MCV 81. Platelets 211 Tsat 12% low TIBC 428 high  09/23/2021: Glucose 137, BUN/Cr  14/1.55. EGFR 57. Na/K 139/3.6. Rest of the CMP normal H/H 15/47. MCV 86. Platelets 233 HbA1C 6.1% (08/2021)   06/16/2021: Glucose 129, BUN/Cr 11/1.37. EGFR >60. Na/K 136/3.7. Rest of the CMP normal Trop HS 8.7 H/H 16/50. MCV 86. Platelets 209 Chol 152, TG 130, HDL 36, LDL 93   05/07/2021: Glucose 128, BUN/Cr 20/1.49. EGFR 60. Na/K 137/3.9.  H/H 17/52. MCV 87. Platelets 178 HbA1C 6.6% Chol 158, TG 227, HDL 32, LDL 81 TSH 3.7 normal   Review of Systems  Cardiovascular:  Positive for dyspnea on exertion (Improved). Negative for chest pain (Right sided, occasional), leg swelling, palpitations and syncope.  Neurological:  Positive for paresthesias.       There were no vitals filed for this visit.  There is no height or weight on file to calculate BMI.     There is no height or weight on file to calculate BMI. There were no vitals filed for this visit.   Objective:   Physical Exam Vitals and nursing note reviewed.  Constitutional:      General: He is not in acute distress.    Appearance: He is well-developed. He is obese.  Pulmonary:     Effort: Pulmonary effort is normal.  Musculoskeletal:     Right lower leg: No edema.     Left lower leg: No edema.  Neurological:     Mental Status: He is alert and oriented to person, place, and time.          Assessment & Recommendations:   41 y.o. 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   NSTEMI: 04/2021. Nonobstructive CAD with aneurysmal coronaries Completed DAPT for year. Continue Aspirin 81 mg daily. Continue metoprolol Check lipid panel  If he need eliquis for prior DVTs, could potentially stop Aspirin, given absence of obstructive CAD.  H/o NSVT: Controlled on metoprolol tartrate to 37.5 mg twice daily.  Mixed hyperlipidemia: On lipitor 80 mg  daily. 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 F/u w/pulmonology.   H/o DVT: In 2020 treated with ? 6 months of anticoagulation He has 3 family members on maternal side with with h/o lupus anticoagulant May need outpatient hematology consult. Defer to PCP.  F/u in 1 year    Nigel Mormon, MD Pager: [phone] Office: [phone]

## 2022-05-07 ENCOUNTER — Ambulatory Visit (HOSPITAL_COMMUNITY)
Admission: RE | Admit: 2022-05-07 | Discharge: 2022-05-07 | Disposition: A | Discharge status: Home / Self Care | Payer: Managed Care, Other (non HMO) | Source: Ambulatory Visit | Attending: Urology | Admitting: Urology

## 2022-05-07 DIAGNOSIS — N200 Calculus of kidney: Secondary | ICD-10-CM | POA: Insufficient documentation

## 2022-05-14 ENCOUNTER — Ambulatory Visit: Payer: Managed Care, Other (non HMO) | Admitting: Urology

## 2022-05-21 ENCOUNTER — Ambulatory Visit (INDEPENDENT_AMBULATORY_CARE_PROVIDER_SITE_OTHER): Payer: Managed Care, Other (non HMO) | Admitting: Urology

## 2022-05-21 VITALS — BP 141/98 | HR 88

## 2022-05-21 DIAGNOSIS — N200 Calculus of kidney: Secondary | ICD-10-CM

## 2022-05-21 DIAGNOSIS — N183 Chronic kidney disease, stage 3 unspecified: Secondary | ICD-10-CM

## 2022-05-21 DIAGNOSIS — N201 Calculus of ureter: Secondary | ICD-10-CM | POA: Diagnosis not present

## 2022-05-21 NOTE — Progress Notes (Signed)
Subjective: 1. Renal stones      05/21/22: Natividad returns today in f/u.  He has a history of stones and had a CT in 9/22 that showed a 70m left UPJ stone.   He had a renal UKoreaprior to this visit that showed no stones or hydro but there were small bilateral renal stones on the last CT.  He had a 24hr urine with Dr. BTheador Hawthornethat was normal except for a low magnesium level and volume of 1 liter.  He is doing well without pain or hematuria.  He has stable CKD.  He has an IPSS of 5 with nocturia x 3.    05/15/21: AHobertis a 41yo male with CKD 3 who had a renal UKoreaas part of his evaluation and was found to have bilateral non-obstructing renal stones.   He had a PET CT for adenopathy on 04/24/20 and no stones were seen.  He was in the ER on 05/05/21 had Chest tightness.  He had a CT Chest and they went ahead and did his CT AP.  He had 2 very small non-obstructing left renal stones and a few tiny RLP  and RUP stones as well.  He has some right renal atrophy.  He has had no flank pain or hematuria.   He has some increased frequency and urgency.  He has nocturia x 3.  His IPSS is 5.  He has no hesitancy or a reduced stream.  He has increased his water intake but doesn't drink coffee or tea.   He has normocalcemia.   He is currently on asa and brilinta for a possible plaque erosion with a mild MI.  He has long standing CKD3 and is seeing nephrology.  His recent PSA on 01/23/21 was only 1.0 with a 31% f/t ratio.   ROS:  Review of Systems  All other systems reviewed and are negative.   No Known Allergies  Past Medical History:  Diagnosis Date   Asthma    DVT (deep venous thrombosis) (HCC)    Dyspnea    at times - no known reason.    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   Myocardial infarction ([facility]    Pneumonia    549 41years of age   Sleep apnea    CPAP @ HS    Past Surgical History:  Procedure Laterality Date    BRONCHIAL NEEDLE ASPIRATION BIOPSY  11/01/2020   Procedure: BRONCHIAL NEEDLE ASPIRATION BIOPSIES;  Surgeon: ARigoberto Noel MD;  [place]  Service: Cardiopulmonary;;   BRONCHIAL WASHINGS  11/01/2020   Procedure: BRONCHIAL WASHINGS;  Surgeon: ARigoberto Noel MD;  [place]  Service: Cardiopulmonary;;   CARDIAC CATHETERIZATION     INTRAVASCULAR ULTRASOUND/IVUS N/A 05/05/2021   Procedure: Intravascular Ultrasound/IVUS;  Surgeon: PNigel Mormon MD;  [facility];  Service: Cardiovascular;  Laterality: N/A;   LEFT HEART CATH AND CORONARY ANGIOGRAPHY N/A 05/05/2021   Procedure: LEFT HEART CATH AND CORONARY ANGIOGRAPHY;  Surgeon: PNigel Mormon MD;  [facility];  Service: Cardiovascular;  Laterality: N/A;   MEDIASTINOSCOPY N/A 07/11/2021   Procedure: MEDIASTINOSCOPY;  Surgeon: HMelrose Nakayama MD;  [place]  Service: Thoracic;  Laterality: N/A;   NO PAST SURGERIES     VIDEO BRONCHOSCOPY N/A 11/01/2020   Procedure: VIDEO BRONCHOSCOPY WITH ENDOBRONCHIAL ULTRASOUND;  Surgeon:  Rigoberto Noel, MD;  Location: [facility];  Service: Cardiopulmonary;  Laterality: N/A;    Social History   Socioeconomic History   Marital status: Legally Separated    Spouse name: Not on file   Number of children: 2   Years of education: Not on file   Highest education level: High school graduate  Occupational History   Not on file  Tobacco Use   Smoking status: Former    Packs/day: 1.00    Years: 3.00    Total pack years: 3.00    Types: Cigarettes    Quit date: 1999    Years since quitting: 24.5   Smokeless tobacco: Current    Types: Chew, Snuff  Vaping Use   Vaping Use: Never used  Substance and Sexual Activity   Alcohol use: Yes    Comment: rarely- maybe 2 mixed drinks in a year   Drug use: Never   Sexual activity: Not on file  Other Topics Concern   Not on file  Social History Narrative   Lives with family    Caffeine- occass soda   Social Determinants of Health   Financial Resource Strain: Not on file  Food Insecurity: Not on file  Transportation Needs: Not on file  Physical Activity: Not on file  Stress: Not on file  Social Connections: Not on file  Intimate Partner Violence: Not on file    Family History  Problem Relation Age of Onset   Pulmonary fibrosis Mother    Cancer Mother    Cancer Father        prostate   Hypertension Father    Fibromyalgia Maternal Aunt    Lupus Maternal Grandmother    Other Maternal Grandmother        clotting disorder   Other Other        clotting disorder    Anti-infectives: Anti-infectives (From admission, onward)    None       Current Outpatient Medications  Medication Sig Dispense Refill   Ticagrelor (BRILINTA PO) Take by mouth.     albuterol (VENTOLIN HFA) 108 (90 Base) MCG/ACT inhaler Inhale 1-2 puffs into the lungs every 6 (six) hours as needed for wheezing or shortness of breath.     amLODipine (NORVASC) 5 MG tablet TAKE 1 TABLET BY MOUTH DAILY 30 tablet 2   ASPIRIN LOW DOSE 81 MG EC tablet TAKE 1 TABLET BY MOUTH DAILY 60 tablet 2   atorvastatin (LIPITOR) 80 MG tablet Take 1 tablet (80 mg total) by mouth daily. 90 tablet 3   cholecalciferol (VITAMIN D) 25 MCG (1000 UNIT) tablet Take 1,000 Units by mouth daily.     cyclobenzaprine (FLEXERIL) 10 MG tablet Take 10 mg by mouth 3 (three) times daily as needed for muscle spasms.     metoprolol tartrate (LOPRESSOR) 25 MG tablet TAKE 1 AND 1/2 TABLETS BY MOUTH TWICE DAILY 180 tablet 3   nitroGLYCERIN (NITROSTAT) 0.4 MG SL tablet Place 1 tablet (0.4 mg total) under the tongue every 5 (five) minutes as needed for chest pain. 30 tablet 1   pantoprazole (PROTONIX) 40 MG tablet Take 40 mg by mouth in the morning.     No current facility-administered medications for this visit.     Objective: Vital signs in last 24 hours: BP (!) 141/98   Pulse 88   Intake/Output from previous day: No  intake/output data recorded. Intake/Output this shift: '@IOTHISSHIFT'$ @   Physical Exam  Lab Results:  No results found for this or any previous  visit (from the past 2160 hour(s)).  UA in 9/22 had >50 RBC's when he had the stone.   24 hour urine results reviewed.   Studies/Results: I have reviewed the CT from 9/22.  Records from Dr. Theador Hawthorne reviewed.   Ultrasound renal complete  Result Date: 05/07/2022 CLINICAL DATA:  Renal stone follow-up EXAM: RENAL / URINARY TRACT ULTRASOUND COMPLETE COMPARISON:  None Available. FINDINGS: Right Kidney: Renal measurements: 10.1 x 6.1 x 5.8 cm = volume: 189 mL. Cortical thinning to 8 mm. Increased cortical echogenicity. Left Kidney: Renal measurements: 14.1 x 6.1 x 5.0 cm = volume: 225 mL. Echogenicity within normal limits. No mass or hydronephrosis visualized. Bladder: Appears normal for degree of bladder distention. Other: None. IMPRESSION: 1. The right kidney demonstrates cortical thinning and increased cortical echogenicity. 2. The left kidney and bladder are normal. Electronically Signed   By: Dorise Bullion III M.D.   On: 05/07/2022 18:43      Assessment/Plan: Renal stones.    He passed a stone in September and had bilateral renal stones on the CT but they are not apparent on the [place] prior to this visit.  He was encouraged to keep his fluid intake up.   Frequency and urgency.   He has mild frequency and nocturia.     No orders of the defined types were placed in this encounter.    Orders Placed This Encounter  Procedures   US RENAL    Standing Status:   Future    Standing Expiration Date:   05/22/2023    Order Specific Question:   Reason for Exam (SYMPTOM  OR DIAGNOSIS REQUIRED)    Answer:   renal stones    Order Specific Question:   Preferred imaging location?    Answer:   [facility]   Urinalysis, Routine w reflex microscopic     No follow-ups on file.    CC: Dr. Ulice Bold and Dr. Gar Ponto.      Irine Seal 05/22/2022 [phone]

## 2022-05-22 ENCOUNTER — Encounter: Payer: Self-pay | Admitting: Urology

## 2022-05-28 ENCOUNTER — Other Ambulatory Visit: Payer: Self-pay | Admitting: Cardiology

## 2022-06-14 IMAGING — DX DG CHEST 2V
2 series · 2 of 2 positions shown · non-contrast
Comparison: 07/22/2021

CLINICAL DATA: Sarcoidosis, dyspnea

EXAM:
CHEST - 2 VIEW

[chest pa]
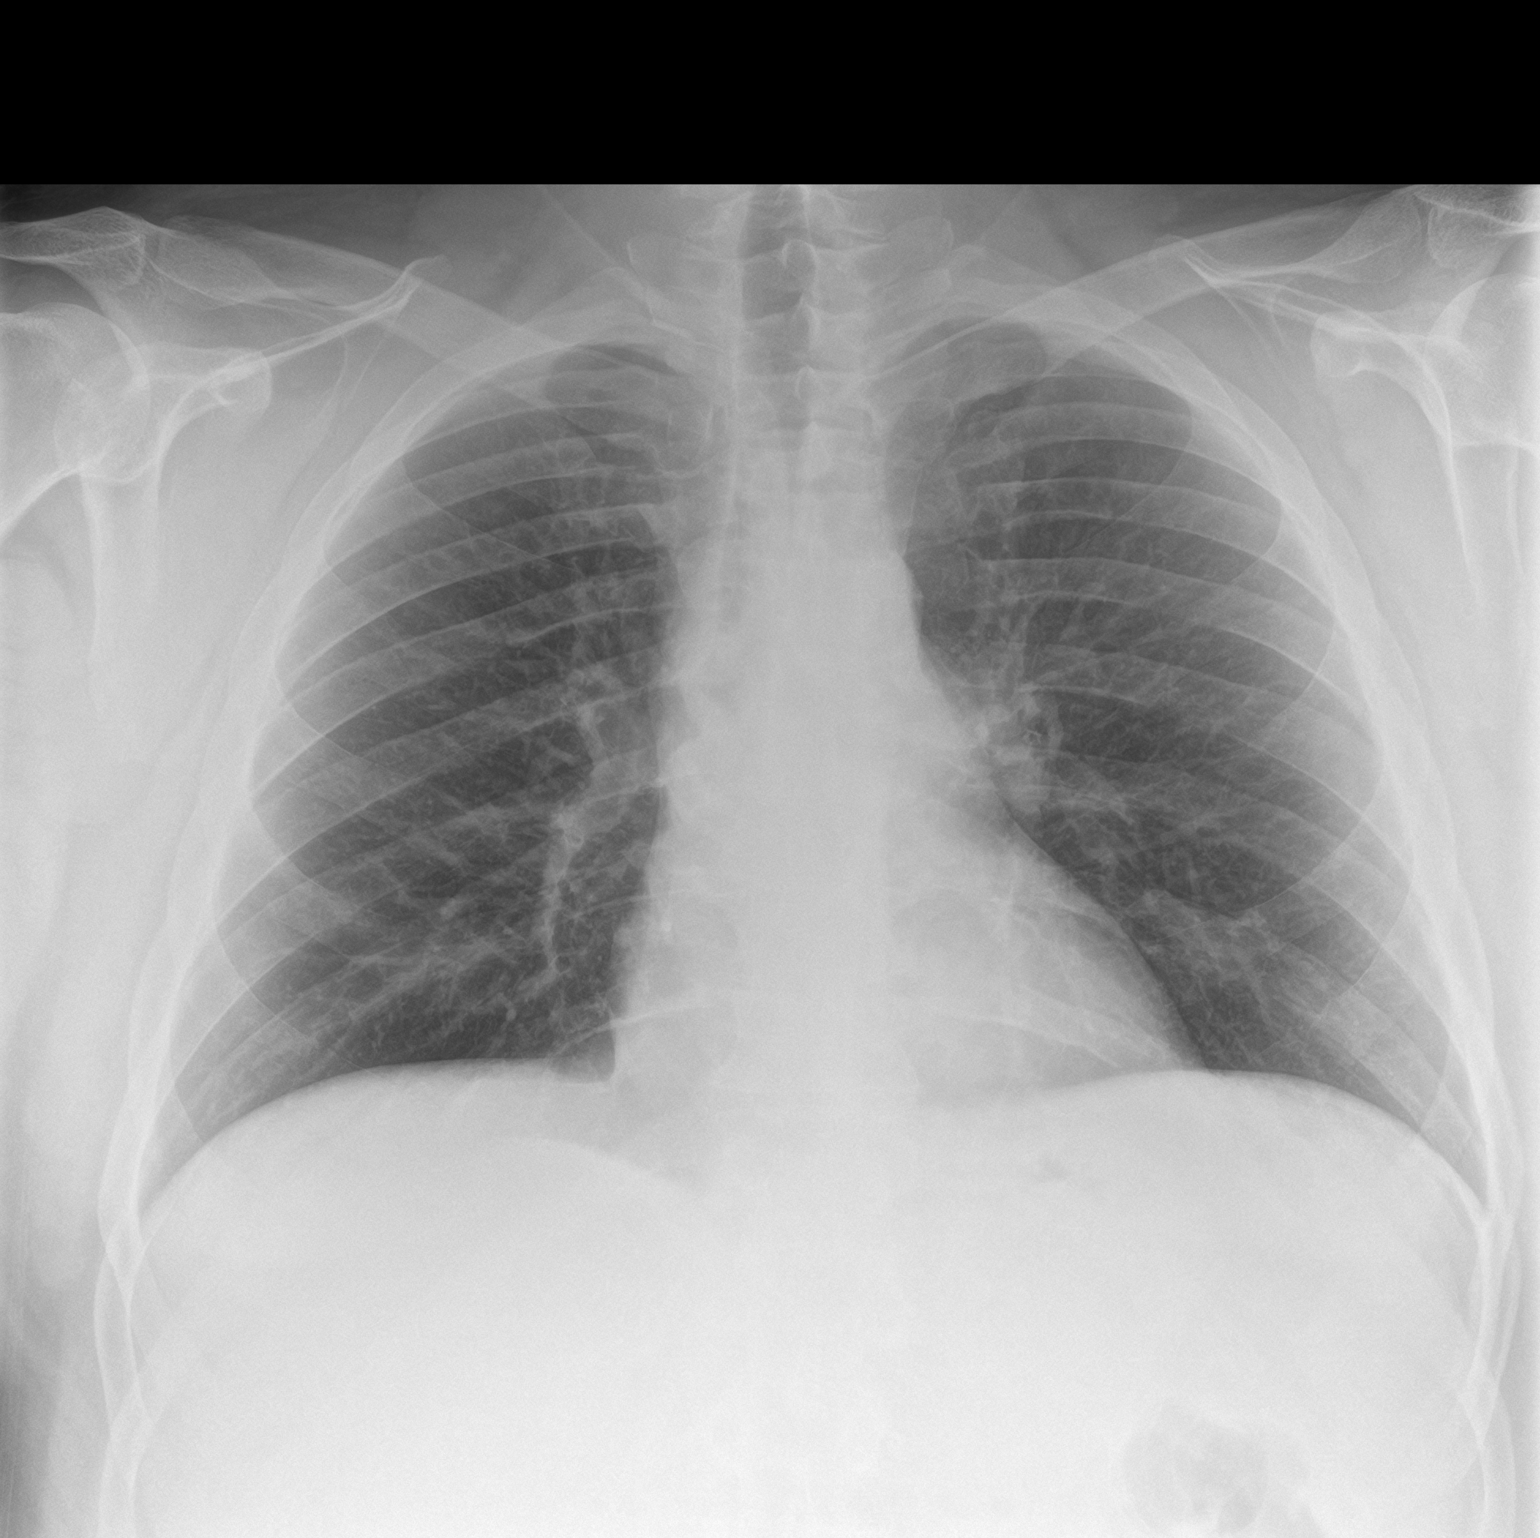

[chest lat]
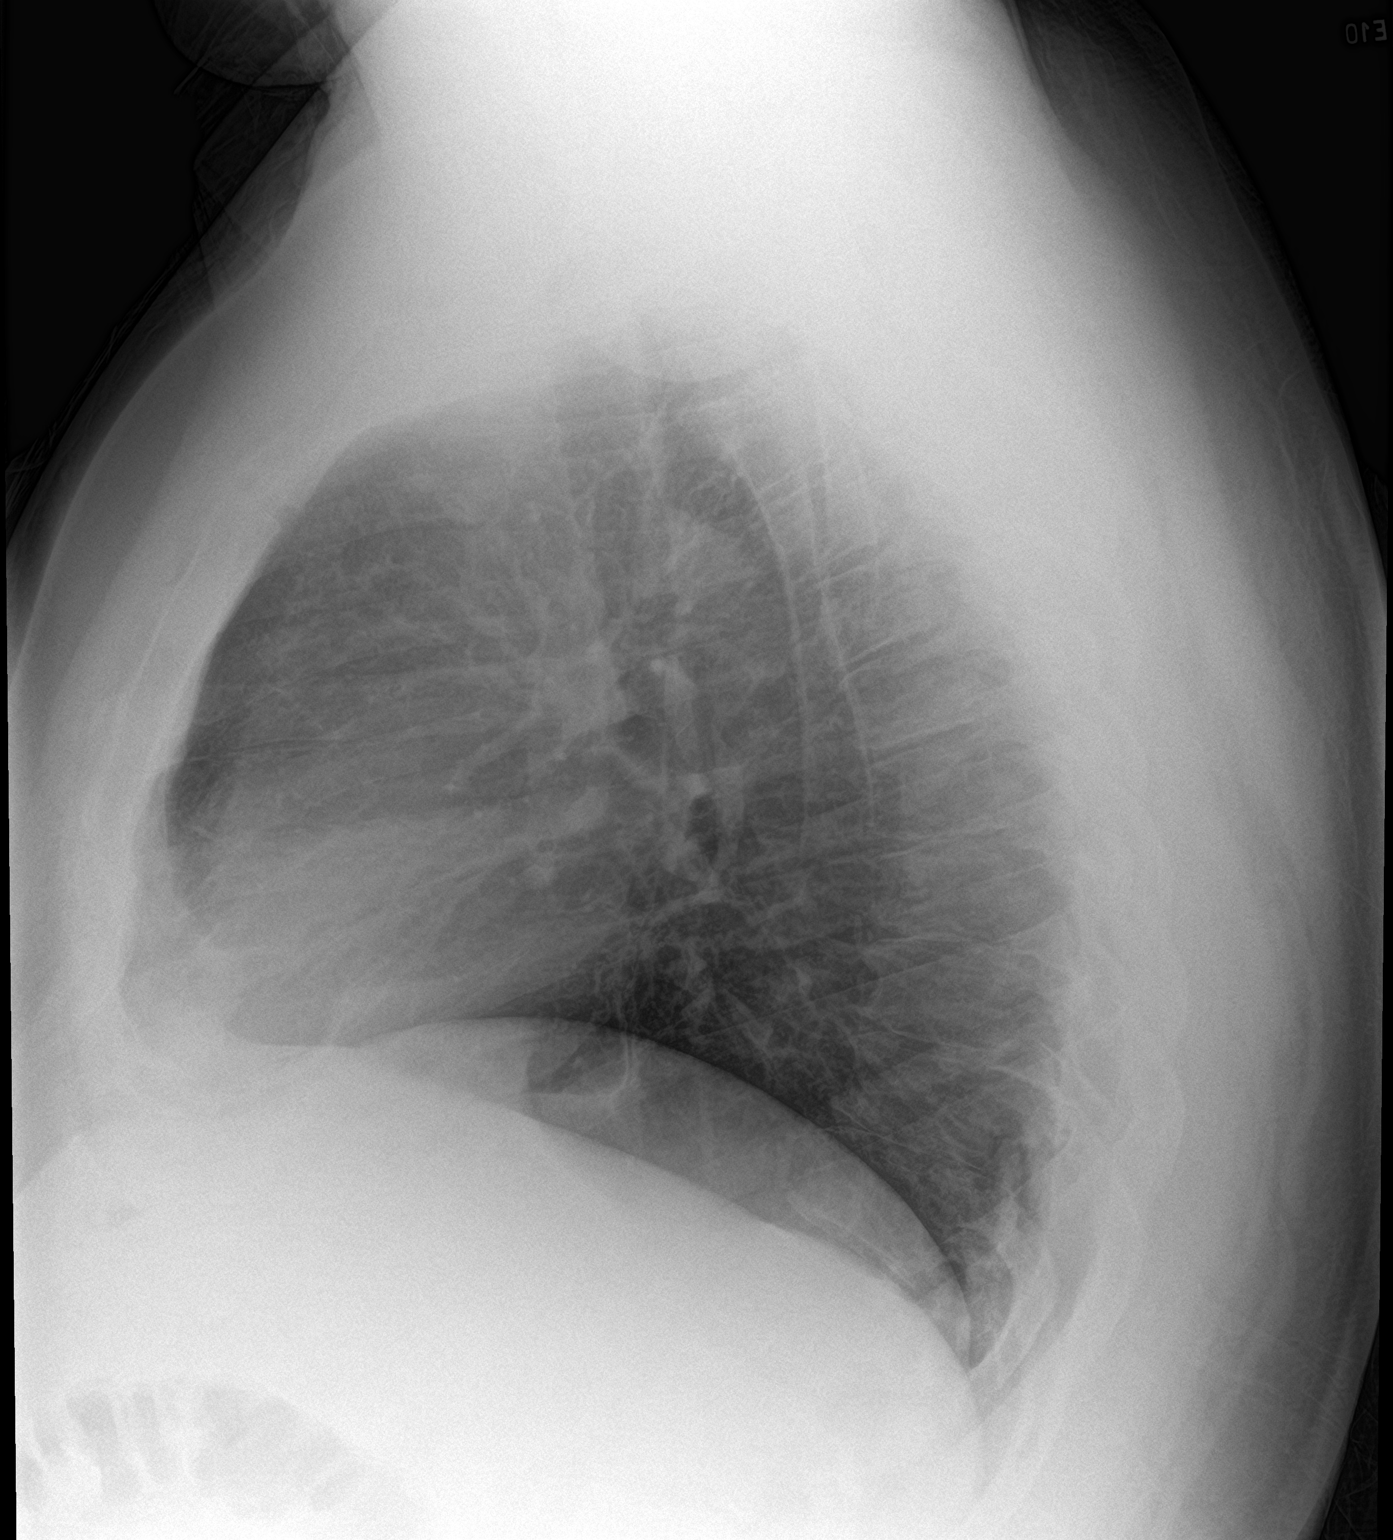

[2 of 2 positions shown; findings below may reference images not displayed]

FINDINGS: The heart size and mediastinal contours are within normal limits.
Both lungs are clear. The visualized skeletal structures are
unremarkable.
IMPRESSION: No active cardiopulmonary disease.

## 2022-06-16 ENCOUNTER — Ambulatory Visit: Payer: Managed Care, Other (non HMO) | Admitting: Pulmonary Disease

## 2022-06-16 ENCOUNTER — Encounter: Payer: Self-pay | Admitting: Pulmonary Disease

## 2022-06-16 DIAGNOSIS — G4733 Obstructive sleep apnea (adult) (pediatric): Secondary | ICD-10-CM | POA: Diagnosis not present

## 2022-06-16 DIAGNOSIS — D869 Sarcoidosis, unspecified: Secondary | ICD-10-CM | POA: Diagnosis not present

## 2022-06-16 NOTE — Progress Notes (Signed)
   Subjective:    Patient ID: Zachary Weeks, male    DOB: [birth year], 41 y.o.   MRN: [id number]  HPI  41  yo obese Man  for FU of mediastinal and hilar lymphadenopathy (first noted 10/2019 ) & OSA   Mediastinoscopy lymph node biopsy >> noncaseating granulomas.  Special stains were negative for AFB and fungus   PMH - RLE DVT in 10/2019   Underwent hematology evaluation and detailed hypercoagulable work-up which was mostly negative except PCR-ABL positive .  bone marrow biopsy 02/2020 neg 04/2021 NSTEMI, hosp adm for hypoxia   Family history of pulmonary fibrosis in his mother   Chief Complaint  Patient presents with   Follow-up    Has not used CPAP for a couple months.    Breathing is stable, he denies drop in oxygen level.  He continues in Contractor business. He admits to missing his CPAP on certain nights because he falls asleep in his recliner.  He has a luna machine  No skin lesions or eye problems Weight is unchanged   Significant tests/ events reviewed  PFTs 06/2021 >> FVC 80%, no airway obs, TLC 84% , DLCO nml   HST 11/2020 [facility] severe OSA TBNA 10/2020 neg, scant lymphoid tissue   10/18/20 Ambulatory saturation-oxygen saturation was 92% on room air at rest and desaturated 91% on walking    04/2021 CT angiogram chest with diffuse nonspecific mediastinal and hilar adenopathy, minimal scarring in the right middle lobe, dependent atelectasis focal subpleural opacity in the right lower lobe likely additional atelectasis or scarring   10/2019 CT angio chest >> Mild mediastinal and bilateral hilar adenopathy, Prevascular lymph node has a short axis diameter of 14 mm. Subcarinal lymph node has a short axis diameter of 19 mm. Right paratracheal lymph node has a short axis diameter of 12 mm. No axillary adenopathy. 5 mm right lower lobe peripheral nodule   PET 04/2020 >> Mediastinal and bilateral hilar lymphadenopathy with associated increased metabolic activity, favored to  represent benign, reactive changes.   CT chest W con 08/2020 Unchanged enlarged mediastinal and bilateral hilar lymph nodes,  which remain nonspecific   Autoimmune Work up from [employer] 01/2020  Rheumatoid factor was negative quantifiron  negative ANA was negative angiotensin converting enzyme was negative anti-SSA antibody and anti-SSB antibody were negative ESR was 17 mm/hr factor V Leiden test was negative  Review of Systems neg for any significant sore throat, dysphagia, itching, sneezing, nasal congestion or excess/ purulent secretions, fever, chills, sweats, unintended wt loss, pleuritic or exertional cp, hempoptysis, orthopnea pnd or change in chronic leg swelling. Also denies presyncope, palpitations, heartburn, abdominal pain, nausea, vomiting, diarrhea or change in bowel or urinary habits, dysuria,hematuria, rash, arthralgias, visual complaints, headache, numbness weakness or ataxia.     Objective:   Physical Exam  Gen. Pleasant, obese, in no distress ENT - no lesions, no post nasal drip Neck: No JVD, no thyromegaly, no carotid bruits Lungs: no use of accessory muscles, no dullness to percussion, decreased without rales or rhonchi  Cardiovascular: Rhythm regular, heart sounds  normal, no murmurs or gallops, no peripheral edema Musculoskeletal: No deformities, no cyanosis or clubbing , no tremors        Assessment & Plan:

## 2022-06-16 NOTE — Patient Instructions (Signed)
  Try to be more diligent with CPAP at least 6 h every night

## 2022-06-16 NOTE — Assessment & Plan Note (Signed)
Appears clinically stable. Chest x-ray 11/2021 did not show significant lymphadenopathy. We discussed signs and symptoms of recurrent sarcoidosis, will monitor clinically

## 2022-06-16 NOTE — Assessment & Plan Note (Signed)
Weight loss encouraged, compliance with goal of at least 4-6 hrs every night is the expectation. Advised against medications with sedative side effects Cautioned against driving when sleepy - understanding that sleepiness will vary on a day to day basis  

## 2022-08-27 ENCOUNTER — Other Ambulatory Visit: Payer: Self-pay | Admitting: Cardiology

## 2022-09-17 NOTE — Progress Notes (Signed)
Referring Provider: [facility] Primary Care Physician:  Caryl Bis, MD Primary Gastroenterologist:  Dr. [name]  Chief Complaint  Patient presents with   Colonoscopy    Colonoscopy screening    HPI:   Zachary Weeks is a 41 y.o. 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   Reviewed labs available in epic. Patient has no history of anemia.  Most recent hemoglobin 17.3 on 08/19/2022.  His ferritin was low at 25, iron saturation low at 18%, total iron normal at 68.  Looks like patient had low iron, iron saturation, and ferritin dating back at least to January 2022. Hemoglobin was 12.9 with microcytic indices in February 2022.   Reviewed her most recent office visit with Dr. Theador Hawthorne 08/22/22.  They had previously discussed patient seeing GI for low iron saturation  back in February 2022.  He was started on oral iron at that time.  Iron was later discontinued as iron saturation became elevated.  In May 2023, he was again noted that his iron saturation was low, again recommended iron replacement and seeing GI.  In September, when he was found to have elevated hemoglobin, this was suspected to be secondary to sleep apnea.  They again discussed the need to see GI for low iron saturation.   Today:  Iron was stopped over 1 month ago.  No brbpr or melena.  Occasional blood in his urine when he has kidney stones.   Chronic reflux is well controlled on Protonix 40 mg daily. Rare breakthrough if eating close to bed. No dysphagia. No abdominal pain, constipation, diarrhea, unintentional weight loss.   Aleve every now and then. 1-2 times a month.   No prior colonoscopy or EGD.   Past Medical History:  Diagnosis Date   Asthma    DVT (deep venous thrombosis) (HCC)     Dyspnea    at times - no known reason.    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   Myocardial infarction [facility])    Pneumonia    21- 26 years of age   Sarcoidosis    Sleep apnea    CPAP @ HS    Past Surgical History:  Procedure Laterality Date   BRONCHIAL NEEDLE ASPIRATION BIOPSY  11/01/2020   Procedure: BRONCHIAL NEEDLE ASPIRATION BIOPSIES;  Surgeon: Rigoberto Noel, MD;  Location: [place];  Service: Cardiopulmonary;;   BRONCHIAL WASHINGS  11/01/2020   Procedure: BRONCHIAL WASHINGS;  Surgeon: Rigoberto Noel, MD;  Location: [place];  Service: Cardiopulmonary;;   CARDIAC CATHETERIZATION     INTRAVASCULAR ULTRASOUND/IVUS N/A 05/05/2021   Procedure: Intravascular Ultrasound/IVUS;  Surgeon: Nigel Mormon, MD;  Location: [place] [facility];  Service: Cardiovascular;  Laterality: N/A;   LEFT HEART CATH AND CORONARY ANGIOGRAPHY N/A 05/05/2021   Procedure: LEFT HEART CATH AND CORONARY ANGIOGRAPHY;  Surgeon: Nigel Mormon, MD;  Location: [place] [facility];  Service: Cardiovascular;  Laterality: N/A;   MEDIASTINOSCOPY N/A 07/11/2021   Procedure: MEDIASTINOSCOPY;  Surgeon: Melrose Nakayama, MD;  Location: [place];  Service: Thoracic;  Laterality: N/A;   VIDEO BRONCHOSCOPY N/A 11/01/2020   Procedure: VIDEO BRONCHOSCOPY WITH ENDOBRONCHIAL ULTRASOUND;  Surgeon: Rigoberto Noel, MD;  Location:  [place] ENDOSCOPY;  Service: Cardiopulmonary;  Laterality: N/A;    Current Outpatient Medications  Medication Sig Dispense Refill   albuterol (VENTOLIN HFA) 108 (90 Base) MCG/ACT inhaler Inhale 1-2 puffs into the lungs every 6 (six) hours as needed for wheezing or shortness of breath.     amLODipine (NORVASC) 5 MG tablet TAKE 1 TABLET BY MOUTH DAILY 30 tablet 5   atorvastatin (LIPITOR) 80 MG tablet TAKE 1 TABLET BY MOUTH DAILY 90 tablet 3   cholecalciferol (VITAMIN D) 25 MCG (1000 UNIT) tablet Take  1,000 Units by mouth daily.     cyclobenzaprine (FLEXERIL) 10 MG tablet Take 10 mg by mouth 3 (three) times daily as needed for muscle spasms.     metoprolol tartrate (LOPRESSOR) 25 MG tablet TAKE 1 AND 1/2 TABLETS BY MOUTH TWICE DAILY 270 tablet 2   nitroGLYCERIN (NITROSTAT) 0.4 MG SL tablet Place 1 tablet (0.4 mg total) under the tongue every 5 (five) minutes as needed for chest pain. 30 tablet 1   pantoprazole (PROTONIX) 40 MG tablet Take 40 mg by mouth in the morning.     Semaglutide (RYBELSUS) 3 MG TABS Take by mouth.     No current facility-administered medications for this visit.    Allergies as of 09/21/2022   (No Known Allergies)    Family History  Problem Relation Age of Onset   Pulmonary fibrosis Mother    Cancer Mother        cervical   Cancer Father        prostate   Hypertension Father    Lupus Maternal Grandmother    Other Maternal Grandmother        clotting disorder   Fibromyalgia Maternal Aunt    Other Other        clotting disorder   Colon cancer Neg Hx     Social History   Socioeconomic History   Marital status: Legally Separated    Spouse name: Not on file   Number of children: 2   Years of education: Not on file   Highest education level: High school graduate  Occupational History   Not on file  Tobacco Use   Smoking status: Former    Packs/day: 1.00    Years: 3.00    Total pack years: 3.00    Types: Cigarettes    Quit date: 1999    Years since quitting: 24.8   Smokeless tobacco: Current    Types: Chew, Snuff  Vaping Use   Vaping Use: Never used  Substance and Sexual Activity   Alcohol use: Yes    Comment: rarely- maybe 2 mixed drinks in a year   Drug use: Never   Sexual activity: Not on file  Other Topics Concern   Not on file  Social History Narrative   Lives with family   Caffeine- occass soda   Social Determinants of Health   Financial Resource Strain: Not on file  Food Insecurity: Not on file  Transportation Needs: Not on  file  Physical Activity: Not on file  Stress: Not on file  Social Connections: Not on file  Intimate Partner Violence: Not on file    Review of Systems: Gen: Denies any fever, chills, cold or flu like symptoms, pre-syncope, or syncope.  CV: Denies chest pain, heart palpitations.  Resp: Admits to chronic intermittent shortness of breath. No cough.  GI: See HPI GU : Denies urinary burning, urinary frequency, urinary hesitancy.  MS: Denies joint pain. Derm: Denies rash. Psych: Denies depression, anxiety.  Heme: See HPI  Physical Exam: BP (!) 133/96 (BP Location: Right Arm, Patient Position: Sitting, Cuff Size: Large)   Pulse 96   Temp 97.7 F (36.5 C) (Temporal)   Ht 6' (1.829 m)   Wt (!) 332 lb 6.4 oz (150.8 kg)   SpO2 93%   BMI 45.08 kg/m  General:   Alert and oriented. Pleasant and cooperative. Well-nourished and well-developed.  Head:  Normocephalic and atraumatic. Eyes:  Without icterus, sclera clear and conjunctiva pink.  Ears:  Normal auditory acuity. Lungs:  Clear to auscultation bilaterally. No wheezes, rales, or rhonchi. No distress.  Heart:  S1, S2 present without murmurs appreciated.  Abdomen:  +BS, soft, non-tender and non-distended. No HSM noted. No guarding or rebound. No masses appreciated.  Rectal:  Deferred  Msk:  Symmetrical without gross deformities. Normal posture. Extremities:  Without edema. Neurologic:  Alert and  oriented x4;  grossly normal neurologically. Skin:  Intact without significant lesions or rashes. Psych:  Normal mood and affect.    Assessment:  41 y.o. 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 Theador Hawthorne for low iron without anemia.   Low iron:  Chronic intermittent low iron, saturation, and ferritin dating back at least to January 2022. Hemoglobin was 12.9 in  February 2022 with microcytic indices. He has been on iron intermittently per nephrology as iron will normalize and/or become elevated. Most recent labs 08/19/22 with hemoglobin 17.3, ferritin was low at 25, iron saturation low at 18%, total iron normal at 68. He has no overt GI bleeding.  Occasional hematuria related to kidney stones.  No significant GI symptoms.  He does have chronic GERD, but this is well controlled on PPI daily. Rare NSAIDs. No anticoagulants or antiplatelets.   Etiology of low iron isn't clear. It is possible he would have developed anemia had he not been started on oral iron. Unable to rule out occult GI bleeding. At this point, we will arrange for a colonoscopy and possible EGD for further evaluation.     Plan:  Proceed with colonoscopy with propofol by Dr. [name] in near future. The risks, benefits, and alternatives have been discussed with the patient in detail. The patient states understanding and desires to proceed.  ASA 3 Hold Rybelsus x 24 hours prior.  If iron is restarted, hold x 10 days prior.  Follow-up after procedures.    Aliene Altes, PA-C [facility] Gastroenterology 09/21/2022

## 2022-09-17 NOTE — H&P (View-Only) (Signed)
Referring Provider: [facility] Primary Care Physician:  Caryl Bis, MD Primary Gastroenterologist:  Dr. [name]  Chief Complaint  Patient presents with   Colonoscopy    Colonoscopy screening    HPI:   Zachary Weeks is a 41 y.o. 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   Reviewed labs available in epic. Patient has no history of anemia.  Most recent hemoglobin 17.3 on 08/19/2022.  His ferritin was low at 25, iron saturation low at 18%, total iron normal at 68.  Looks like patient had low iron, iron saturation, and ferritin dating back at least to January 2022. Hemoglobin was 12.9 with microcytic indices in February 2022.   Reviewed her most recent office visit with Dr. Theador Hawthorne 08/22/22.  They had previously discussed patient seeing GI for low iron saturation  back in February 2022.  He was started on oral iron at that time.  Iron was later discontinued as iron saturation became elevated.  In May 2023, he was again noted that his iron saturation was low, again recommended iron replacement and seeing GI.  In September, when he was found to have elevated hemoglobin, this was suspected to be secondary to sleep apnea.  They again discussed the need to see GI for low iron saturation.   Today:  Iron was stopped over 1 month ago.  No brbpr or melena.  Occasional blood in his urine when he has kidney stones.   Chronic reflux is well controlled on Protonix 40 mg daily. Rare breakthrough if eating close to bed. No dysphagia. No abdominal pain, constipation, diarrhea, unintentional weight loss.   Aleve every now and then. 1-2 times a month.   No prior colonoscopy or EGD.   Past Medical History:  Diagnosis Date   Asthma    DVT (deep venous thrombosis) (HCC)     Dyspnea    at times - no known reason.    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   Myocardial infarction [facility])    Pneumonia    35- 1 years of age   Sarcoidosis    Sleep apnea    CPAP @ HS    Past Surgical History:  Procedure Laterality Date   BRONCHIAL NEEDLE ASPIRATION BIOPSY  11/01/2020   Procedure: BRONCHIAL NEEDLE ASPIRATION BIOPSIES;  Surgeon: Rigoberto Noel, MD;  Location: [place];  Service: Cardiopulmonary;;   BRONCHIAL WASHINGS  11/01/2020   Procedure: BRONCHIAL WASHINGS;  Surgeon: Rigoberto Noel, MD;  Location: [place];  Service: Cardiopulmonary;;   CARDIAC CATHETERIZATION     INTRAVASCULAR ULTRASOUND/IVUS N/A 05/05/2021   Procedure: Intravascular Ultrasound/IVUS;  Surgeon: Nigel Mormon, MD;  Location: [place] [facility];  Service: Cardiovascular;  Laterality: N/A;   LEFT HEART CATH AND CORONARY ANGIOGRAPHY N/A 05/05/2021   Procedure: LEFT HEART CATH AND CORONARY ANGIOGRAPHY;  Surgeon: Nigel Mormon, MD;  Location: [place] [facility];  Service: Cardiovascular;  Laterality: N/A;   MEDIASTINOSCOPY N/A 07/11/2021   Procedure: MEDIASTINOSCOPY;  Surgeon: Melrose Nakayama, MD;  Location: [place];  Service: Thoracic;  Laterality: N/A;   VIDEO BRONCHOSCOPY N/A 11/01/2020   Procedure: VIDEO BRONCHOSCOPY WITH ENDOBRONCHIAL ULTRASOUND;  Surgeon: Rigoberto Noel, MD;  Location:  [place] [facility];  Service: Cardiopulmonary;  Laterality: N/A;    Current Outpatient Medications  Medication Sig Dispense Refill   albuterol (VENTOLIN HFA) 108 (90 Base) MCG/ACT inhaler Inhale 1-2 puffs into the lungs every 6 (six) hours as needed for wheezing or shortness of breath.     amLODipine (NORVASC) 5 MG tablet TAKE 1 TABLET BY MOUTH DAILY 30 tablet 5   atorvastatin (LIPITOR) 80 MG tablet TAKE 1 TABLET BY MOUTH DAILY 90 tablet 3   cholecalciferol (VITAMIN D) 25 MCG (1000 UNIT) tablet Take  1,000 Units by mouth daily.     cyclobenzaprine (FLEXERIL) 10 MG tablet Take 10 mg by mouth 3 (three) times daily as needed for muscle spasms.     metoprolol tartrate (LOPRESSOR) 25 MG tablet TAKE 1 AND 1/2 TABLETS BY MOUTH TWICE DAILY 270 tablet 2   nitroGLYCERIN (NITROSTAT) 0.4 MG SL tablet Place 1 tablet (0.4 mg total) under the tongue every 5 (five) minutes as needed for chest pain. 30 tablet 1   pantoprazole (PROTONIX) 40 MG tablet Take 40 mg by mouth in the morning.     Semaglutide (RYBELSUS) 3 MG TABS Take by mouth.     No current facility-administered medications for this visit.    Allergies as of 09/21/2022   (No Known Allergies)    Family History  Problem Relation Age of Onset   Pulmonary fibrosis Mother    Cancer Mother        cervical   Cancer Father        prostate   Hypertension Father    Lupus Maternal Grandmother    Other Maternal Grandmother        clotting disorder   Fibromyalgia Maternal Aunt    Other Other        clotting disorder   Colon cancer Neg Hx     Social History   Socioeconomic History   Marital status: Legally Separated    Spouse name: Not on file   Number of children: 2   Years of education: Not on file   Highest education level: High school graduate  Occupational History   Not on file  Tobacco Use   Smoking status: Former    Packs/day: 1.00    Years: 3.00    Total pack years: 3.00    Types: Cigarettes    Quit date: 1999    Years since quitting: 24.8   Smokeless tobacco: Current    Types: Chew, Snuff  Vaping Use   Vaping Use: Never used  Substance and Sexual Activity   Alcohol use: Yes    Comment: rarely- maybe 2 mixed drinks in a year   Drug use: Never   Sexual activity: Not on file  Other Topics Concern   Not on file  Social History Narrative   Lives with family   Caffeine- occass soda   Social Determinants of Health   Financial Resource Strain: Not on file  Food Insecurity: Not on file  Transportation Needs: Not on  file  Physical Activity: Not on file  Stress: Not on file  Social Connections: Not on file  Intimate Partner Violence: Not on file    Review of Systems: Gen: Denies any fever, chills, cold or flu like symptoms, pre-syncope, or syncope.  CV: Denies chest pain, heart palpitations.  Resp: Admits to chronic intermittent shortness of breath. No cough.  GI: See HPI GU : Denies urinary burning, urinary frequency, urinary hesitancy.  MS: Denies joint pain. Derm: Denies rash. Psych: Denies depression, anxiety.  Heme: See HPI  Physical Exam: BP (!) 133/96 (BP Location: Right Arm, Patient Position: Sitting, Cuff Size: Large)   Pulse 96   Temp 97.7 F (36.5 C) (Temporal)   Ht 6' (1.829 m)   Wt (!) 332 lb 6.4 oz (150.8 kg)   SpO2 93%   BMI 45.08 kg/m  General:   Alert and oriented. Pleasant and cooperative. Well-nourished and well-developed.  Head:  Normocephalic and atraumatic. Eyes:  Without icterus, sclera clear and conjunctiva pink.  Ears:  Normal auditory acuity. Lungs:  Clear to auscultation bilaterally. No wheezes, rales, or rhonchi. No distress.  Heart:  S1, S2 present without murmurs appreciated.  Abdomen:  +BS, soft, non-tender and non-distended. No HSM noted. No guarding or rebound. No masses appreciated.  Rectal:  Deferred  Msk:  Symmetrical without gross deformities. Normal posture. Extremities:  Without edema. Neurologic:  Alert and  oriented x4;  grossly normal neurologically. Skin:  Intact without significant lesions or rashes. Psych:  Normal mood and affect.    Assessment:  41 y.o. 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 Theador Hawthorne for low iron without anemia.   Low iron:  Chronic intermittent low iron, saturation, and ferritin dating back at least to January 2022. Hemoglobin was 12.9 in  February 2022 with microcytic indices. He has been on iron intermittently per nephrology as iron will normalize and/or become elevated. Most recent labs 08/19/22 with hemoglobin 17.3, ferritin was low at 25, iron saturation low at 18%, total iron normal at 68. He has no overt GI bleeding.  Occasional hematuria related to kidney stones.  No significant GI symptoms.  He does have chronic GERD, but this is well controlled on PPI daily. Rare NSAIDs. No anticoagulants or antiplatelets.   Etiology of low iron isn't clear. It is possible he would have developed anemia had he not been started on oral iron. Unable to rule out occult GI bleeding. At this point, we will arrange for a colonoscopy and possible EGD for further evaluation.     Plan:  Proceed with colonoscopy with propofol by Dr. [name] in near future. The risks, benefits, and alternatives have been discussed with the patient in detail. The patient states understanding and desires to proceed.  ASA 3 Hold Rybelsus x 24 hours prior.  If iron is restarted, hold x 10 days prior.  Follow-up after procedures.    Aliene Altes, PA-C [facility] 09/21/2022

## 2022-09-21 ENCOUNTER — Encounter: Payer: Self-pay | Admitting: Gastroenterology

## 2022-09-21 ENCOUNTER — Ambulatory Visit (INDEPENDENT_AMBULATORY_CARE_PROVIDER_SITE_OTHER): Payer: Managed Care, Other (non HMO) | Admitting: Gastroenterology

## 2022-09-21 VITALS — BP 133/96 | HR 96 | Temp 97.7°F | Ht 72.0 in | Wt 332.4 lb

## 2022-09-21 DIAGNOSIS — E611 Iron deficiency: Secondary | ICD-10-CM

## 2022-09-21 NOTE — Patient Instructions (Signed)
We will arrange to have a colonoscopy and possible upper endoscopy in the near future with Dr. [name]. You will need to hold Rybelsus for 24 hours prior to procedures.  You may continue to hold iron.  If Dr. Theador Hawthorne starts you back on iron, you will need to hold this for 10 days before your procedures.  We will follow-up with you in the office after your procedures.  Do not hesitate to call sooner if you have questions or concerns.  It was nice to meet you today!  Aliene Altes, PA-C [facility]

## 2022-09-22 ENCOUNTER — Telehealth: Payer: Self-pay | Admitting: *Deleted

## 2022-09-22 ENCOUNTER — Encounter: Payer: Self-pay | Admitting: *Deleted

## 2022-09-22 MED ORDER — PEG 3350-KCL-NA BICARB-NACL 420 G PO SOLR: 4000.0000 mL | Freq: Once | ORAL | 0 refills | Status: AC

## 2022-09-22 NOTE — Telephone Encounter (Signed)
Pt scheduled for 10/08/22 at 11:30 am for TCS +/- EGD with Dr.Carver. Instructions sent via MyChart. Prep sent to pharmacy.

## 2022-09-22 NOTE — Telephone Encounter (Signed)
PA SJG:GEZM case has been Approved Authorization # [id number] PA good until 03/21/2023

## 2022-10-02 NOTE — Patient Instructions (Signed)
Zachary Weeks  10/02/2022     '@PREFPERIOPPHARMACY'$ @   Your procedure is scheduled on  10/08/2022.   Report to [facility] at  1015  A.M.   Call this number if you have problems the morning of surgery:  [phone]  If you experience any cold or flu symptoms such as cough, fever, chills, shortness of breath, etc. between now and your scheduled surgery, please notify us at the above number.   Remember:  Follow the diet and prep instructions given to you by the office.      Use your inhaler before you come and bring your rescue inhaler with you.     Take these medicines the morning of surgery with A SIP OF WATER     amlodipine, flexeril(if needed), metoprolol, protonix.     Do not wear jewelry, make-up or nail polish.  Do not wear lotions, powders, or perfumes, or deodorant.  Do not shave 48 hours prior to surgery.  Men may shave face and neck.  Do not bring valuables to the hospital.  [facility] is not responsible for any belongings or valuables.  Contacts, dentures or bridgework may not be worn into surgery.  Leave your suitcase in the car.  After surgery it may be brought to your room.  For patients admitted to the hospital, discharge time will be determined by your treatment team.  Patients discharged the day of surgery will not be allowed to drive home and must have someone with them for 24 hours.    Special instructions:   DO NOT smoke tobacco or vape for 24 hours before your procedure.  Please read over the following fact sheets that you were given. Anesthesia Post-op Instructions and Care and Recovery After Surgery      Upper Endoscopy, Adult, Care After After the procedure, it is common to have a sore throat. It is also common to have: Mild stomach pain or discomfort. Bloating. Nausea. Follow these instructions at home: The instructions below may help you care for yourself at home. Your health care provider may give you more instructions. If  you have questions, ask your health care provider. If you were given a sedative during the procedure, it can affect you for several hours. Do not drive or operate machinery until your health care provider says that it is safe. If you will be going home right after the procedure, plan to have a responsible adult: Take you home from the hospital or clinic. You will not be allowed to drive. Care for you for the time you are told. Follow instructions from your health care provider about what you may eat and drink. Return to your normal activities as told by your health care provider. Ask your health care provider what activities are safe for you. Take over-the-counter and prescription medicines only as told by your health care provider. Contact a health care provider if you: Have a sore throat that lasts longer than one day. Have trouble swallowing. Have a fever. Get help right away if you: Vomit blood or your vomit looks like coffee grounds. Have bloody, black, or tarry stools. Have a very bad sore throat or you cannot swallow. Have difficulty breathing or very bad pain in your chest or abdomen. These symptoms may be an emergency. Get help right away. Call 911. Do not wait to see if the symptoms will go away. Do not drive yourself to the hospital. Summary After the procedure, it is  common to have a sore throat, mild stomach discomfort, bloating, and nausea. If you were given a sedative during the procedure, it can affect you for several hours. Do not drive until your health care provider says that it is safe. Follow instructions from your health care provider about what you may eat and drink. Return to your normal activities as told by your health care provider. This information is not intended to replace advice given to you by your health care provider. Make sure you discuss any questions you have with your health care provider. Document Revised: 02/18/2022 Document Reviewed:  02/18/2022 Elsevier Patient Education  [place]. Colonoscopy, Adult, Care After The following information offers guidance on how to care for yourself after your procedure. Your health care provider may also give you more specific instructions. If you have problems or questions, contact your health care provider. What can I expect after the procedure? After the procedure, it is common to have: A small amount of blood in your stool for 24 hours after the procedure. Some gas. Mild cramping or bloating of your abdomen. Follow these instructions at home: Eating and drinking  Drink enough fluid to keep your urine pale yellow. Follow instructions from your health care provider about eating or drinking restrictions. Resume your normal diet as told by your health care provider. Avoid heavy or fried foods that are hard to digest. Activity Rest as told by your health care provider. Avoid sitting for a long time without moving. Get up to take short walks every 1-2 hours. This is important to improve blood flow and breathing. Ask for help if you feel weak or unsteady. Return to your normal activities as told by your health care provider. Ask your health care provider what activities are safe for you. Managing cramping and bloating  Try walking around when you have cramps or feel bloated. If directed, apply heat to your abdomen as told by your health care provider. Use the heat source that your health care provider recommends, such as a moist heat pack or a heating pad. Place a towel between your skin and the heat source. Leave the heat on for 20-30 minutes. Remove the heat if your skin turns bright red. This is especially important if you are unable to feel pain, heat, or cold. You have a greater risk of getting burned. General instructions If you were given a sedative during the procedure, it can affect you for several hours. Do not drive or operate machinery until your health care provider  says that it is safe. For the first 24 hours after the procedure: Do not sign important documents. Do not drink alcohol. Do your regular daily activities at a slower pace than normal. Eat soft foods that are easy to digest. Take over-the-counter and prescription medicines only as told by your health care provider. Keep all follow-up visits. This is important. Contact a health care provider if: You have blood in your stool 2-3 days after the procedure. Get help right away if: You have more than a small spotting of blood in your stool. You have large blood clots in your stool. You have swelling of your abdomen. You have nausea or vomiting. You have a fever. You have increasing pain in your abdomen that is not relieved with medicine. These symptoms may be an emergency. Get help right away. Call 911. Do not wait to see if the symptoms will go away. Do not drive yourself to the hospital. Summary After the procedure, it is  common to have a small amount of blood in your stool. You may also have mild cramping and bloating of your abdomen. If you were given a sedative during the procedure, it can affect you for several hours. Do not drive or operate machinery until your health care provider says that it is safe. Get help right away if you have a lot of blood in your stool, nausea or vomiting, a fever, or increased pain in your abdomen. This information is not intended to replace advice given to you by your health care provider. Make sure you discuss any questions you have with your health care provider. Document Revised: 07/02/2021 Document Reviewed: 07/02/2021 Elsevier Patient Education  [place] After The following information offers guidance on how to care for yourself after your procedure. Your health care provider may also give you more specific instructions. If you have problems or questions, contact your health care provider. What can I expect  after the procedure? After the procedure, it is common to have: Tiredness. Little or no memory about what happened during or after the procedure. Impaired judgment when it comes to making decisions. Nausea or vomiting. Some trouble with balance. Follow these instructions at home: For the time period you were told by your health care provider:  Rest. Do not participate in activities where you could fall or become injured. Do not drive or use machinery. Do not drink alcohol. Do not take sleeping pills or medicines that cause drowsiness. Do not make important decisions or sign legal documents. Do not take care of children on your own. Medicines Take over-the-counter and prescription medicines only as told by your health care provider. If you were prescribed antibiotics, take them as told by your health care provider. Do not stop using the antibiotic even if you start to feel better. Eating and drinking Follow instructions from your health care provider about what you may eat and drink. Drink enough fluid to keep your urine pale yellow. If you vomit: Drink clear fluids slowly and in small amounts as you are able. Clear fluids include water, ice chips, low-calorie sports drinks, and fruit juice that has water added to it (diluted fruit juice). Eat light and bland foods in small amounts as you are able. These foods include bananas, applesauce, rice, lean meats, toast, and crackers. General instructions  Have a responsible adult stay with you for the time you are told. It is important to have someone help care for you until you are awake and alert. If you have sleep apnea, surgery and some medicines can increase your risk for breathing problems. Follow instructions from your health care provider about wearing your sleep device: When you are sleeping. This includes during daytime naps. While taking prescription pain medicines, sleeping medicines, or medicines that make you drowsy. Do not use  any products that contain nicotine or tobacco. These products include cigarettes, chewing tobacco, and vaping devices, such as e-cigarettes. If you need help quitting, ask your health care provider. Contact a health care provider if: You feel nauseous or vomit every time you eat or drink. You feel light-headed. You are still sleepy or having trouble with balance after 24 hours. You get a rash. You have a fever. You have redness or swelling around the IV site. Get help right away if: You have trouble breathing. You have new confusion after you get home. These symptoms may be an emergency. Get help right away. Call 911. Do not wait to see if  the symptoms will go away. Do not drive yourself to the hospital. This information is not intended to replace advice given to you by your health care provider. Make sure you discuss any questions you have with your health care provider. Document Revised: 04/06/2022 Document Reviewed: 04/06/2022 Elsevier Patient Education  [place].

## 2022-10-05 ENCOUNTER — Encounter (HOSPITAL_COMMUNITY): Payer: Self-pay

## 2022-10-05 ENCOUNTER — Other Ambulatory Visit: Payer: Self-pay

## 2022-10-05 ENCOUNTER — Encounter (HOSPITAL_COMMUNITY)
Admission: RE | Admit: 2022-10-05 | Discharge: 2022-10-05 | Disposition: A | Discharge status: Home / Self Care | Payer: Managed Care, Other (non HMO) | Source: Ambulatory Visit | Attending: Internal Medicine | Admitting: Internal Medicine

## 2022-10-05 VITALS — BP 132/91 | HR 119 | Temp 97.7°F | Resp 18 | Ht 72.0 in | Wt 332.4 lb

## 2022-10-05 DIAGNOSIS — I10 Essential (primary) hypertension: Secondary | ICD-10-CM

## 2022-10-05 DIAGNOSIS — Z01810 Encounter for preprocedural cardiovascular examination: Secondary | ICD-10-CM | POA: Insufficient documentation

## 2022-10-05 HISTORY — DX: Prediabetes: R73.03

## 2022-10-08 ENCOUNTER — Ambulatory Visit (HOSPITAL_COMMUNITY): Payer: Managed Care, Other (non HMO) | Admitting: Anesthesiology

## 2022-10-08 ENCOUNTER — Ambulatory Visit (HOSPITAL_COMMUNITY)
Admission: RE | Admit: 2022-10-08 | Discharge: 2022-10-08 | Disposition: A | Discharge status: Home / Self Care | Payer: Managed Care, Other (non HMO) | Attending: Internal Medicine | Admitting: Internal Medicine

## 2022-10-08 ENCOUNTER — Encounter (HOSPITAL_COMMUNITY): Payer: Self-pay

## 2022-10-08 ENCOUNTER — Encounter (HOSPITAL_COMMUNITY)
Admission: RE | Disposition: A | Discharge status: Home / Self Care | Payer: Self-pay | Source: Home / Self Care | Attending: Internal Medicine

## 2022-10-08 DIAGNOSIS — D127 Benign neoplasm of rectosigmoid junction: Secondary | ICD-10-CM | POA: Diagnosis not present

## 2022-10-08 DIAGNOSIS — Z79899 Other long term (current) drug therapy: Secondary | ICD-10-CM | POA: Insufficient documentation

## 2022-10-08 DIAGNOSIS — N189 Chronic kidney disease, unspecified: Secondary | ICD-10-CM | POA: Insufficient documentation

## 2022-10-08 DIAGNOSIS — I252 Old myocardial infarction: Secondary | ICD-10-CM | POA: Diagnosis not present

## 2022-10-08 DIAGNOSIS — K648 Other hemorrhoids: Secondary | ICD-10-CM | POA: Insufficient documentation

## 2022-10-08 DIAGNOSIS — K2950 Unspecified chronic gastritis without bleeding: Secondary | ICD-10-CM | POA: Insufficient documentation

## 2022-10-08 DIAGNOSIS — K317 Polyp of stomach and duodenum: Secondary | ICD-10-CM

## 2022-10-08 DIAGNOSIS — K2970 Gastritis, unspecified, without bleeding: Secondary | ICD-10-CM

## 2022-10-08 DIAGNOSIS — D124 Benign neoplasm of descending colon: Secondary | ICD-10-CM

## 2022-10-08 DIAGNOSIS — G4733 Obstructive sleep apnea (adult) (pediatric): Secondary | ICD-10-CM | POA: Diagnosis not present

## 2022-10-08 DIAGNOSIS — D123 Benign neoplasm of transverse colon: Secondary | ICD-10-CM | POA: Diagnosis not present

## 2022-10-08 DIAGNOSIS — Z86718 Personal history of other venous thrombosis and embolism: Secondary | ICD-10-CM | POA: Insufficient documentation

## 2022-10-08 DIAGNOSIS — D509 Iron deficiency anemia, unspecified: Secondary | ICD-10-CM | POA: Diagnosis present

## 2022-10-08 DIAGNOSIS — K219 Gastro-esophageal reflux disease without esophagitis: Secondary | ICD-10-CM | POA: Diagnosis not present

## 2022-10-08 DIAGNOSIS — D869 Sarcoidosis, unspecified: Secondary | ICD-10-CM | POA: Diagnosis not present

## 2022-10-08 DIAGNOSIS — K31A19 Gastric intestinal metaplasia without dysplasia, unspecified site: Secondary | ICD-10-CM | POA: Diagnosis not present

## 2022-10-08 DIAGNOSIS — J45909 Unspecified asthma, uncomplicated: Secondary | ICD-10-CM | POA: Diagnosis not present

## 2022-10-08 DIAGNOSIS — F17220 Nicotine dependence, chewing tobacco, uncomplicated: Secondary | ICD-10-CM | POA: Insufficient documentation

## 2022-10-08 DIAGNOSIS — I129 Hypertensive chronic kidney disease with stage 1 through stage 4 chronic kidney disease, or unspecified chronic kidney disease: Secondary | ICD-10-CM | POA: Diagnosis not present

## 2022-10-08 DIAGNOSIS — I2510 Atherosclerotic heart disease of native coronary artery without angina pectoris: Secondary | ICD-10-CM | POA: Diagnosis not present

## 2022-10-08 HISTORY — PX: SUBMUCOSAL LIFTING INJECTION: SHX6855

## 2022-10-08 HISTORY — PX: BIOPSY: SHX5522

## 2022-10-08 HISTORY — PX: POLYPECTOMY: SHX5525

## 2022-10-08 HISTORY — PX: COLONOSCOPY WITH PROPOFOL: SHX5780

## 2022-10-08 HISTORY — PX: SUBMUCOSAL TATTOO INJECTION: SHX6856

## 2022-10-08 HISTORY — PX: HEMOSTASIS CLIP PLACEMENT: SHX6857

## 2022-10-08 HISTORY — PX: ESOPHAGOGASTRODUODENOSCOPY (EGD) WITH PROPOFOL: SHX5813

## 2022-10-08 SURGERY — COLONOSCOPY WITH PROPOFOL
Anesthesia: General

## 2022-10-08 MED ORDER — PROPOFOL 500 MG/50ML IV EMUL
INTRAVENOUS | Status: DC | PRN
  Administered 2022-10-08: 150 ug/kg/min via INTRAVENOUS

## 2022-10-08 MED ORDER — LIDOCAINE HCL (PF) 2 % IJ SOLN
INTRAMUSCULAR | Status: AC
  Filled 2022-10-08: qty 5

## 2022-10-08 MED ORDER — SPOT INK MARKER SYRINGE KIT
PACK | SUBMUCOSAL | Status: DC | PRN
  Administered 2022-10-08: 1 mL via SUBMUCOSAL

## 2022-10-08 MED ORDER — LACTATED RINGERS IV SOLN: INTRAVENOUS | Status: DC

## 2022-10-08 MED ORDER — PHENYLEPHRINE 80 MCG/ML (10ML) SYRINGE FOR IV PUSH (FOR BLOOD PRESSURE SUPPORT)
PREFILLED_SYRINGE | INTRAVENOUS | Status: AC
  Filled 2022-10-08: qty 10

## 2022-10-08 MED ORDER — PHENYLEPHRINE HCL (PRESSORS) 10 MG/ML IV SOLN
INTRAVENOUS | Status: DC | PRN
  Administered 2022-10-08 (×3): 160 ug via INTRAVENOUS

## 2022-10-08 MED ORDER — PROPOFOL 1000 MG/100ML IV EMUL
INTRAVENOUS | Status: AC
  Filled 2022-10-08: qty 100

## 2022-10-08 MED ORDER — LACTATED RINGERS IV SOLN: INTRAVENOUS | Status: DC | PRN

## 2022-10-08 MED ORDER — DEXMEDETOMIDINE HCL IN NACL 80 MCG/20ML IV SOLN
INTRAVENOUS | Status: DC | PRN
  Administered 2022-10-08: 20 ug via BUCCAL

## 2022-10-08 MED ORDER — PROPOFOL 10 MG/ML IV BOLUS
INTRAVENOUS | Status: DC | PRN
  Administered 2022-10-08: 100 mg via INTRAVENOUS
  Administered 2022-10-08: 30 mg via INTRAVENOUS
  Administered 2022-10-08: 20 mg via INTRAVENOUS

## 2022-10-08 MED ORDER — SPOT INK MARKER SYRINGE KIT
PACK | SUBMUCOSAL | Status: AC
  Filled 2022-10-08: qty 5

## 2022-10-08 MED ORDER — LIDOCAINE HCL (CARDIAC) PF 100 MG/5ML IV SOSY
PREFILLED_SYRINGE | INTRAVENOUS | Status: DC | PRN
  Administered 2022-10-08: 100 mg via INTRAVENOUS

## 2022-10-08 NOTE — Op Note (Signed)
[facility] Patient Name: Zachary Weeks Procedure Date: 10/08/2022 11:16 AM MRN: [id number] Date of Birth: Aug 22, 1981 Attending MD: Zachary Weeks. Zachary Weeks , [place], [id number] CSN: [id number] Age: 41 Admit Type: Outpatient Procedure:                Upper GI endoscopy Indications:              Iron deficiency anemia Providers:                Zachary Weeks. Zachary Chatters, DO, Lambert Mody, Dereck Leep, Technician Referring MD:              Medicines:                See the Anesthesia note for documentation of the                            administered medications Complications:            No immediate complications. Estimated Blood Loss:     Estimated blood loss was minimal. Procedure:                Pre-Anesthesia Assessment:                           - The anesthesia plan was to use monitored                            anesthesia care (MAC).                           After obtaining informed consent, the endoscope was                            passed under direct vision. Throughout the                            procedure, the patient's blood pressure, pulse, and                            oxygen saturations were monitored continuously. The                            [id number]-H190 ([id number]) scope was introduced through the                            mouth, and advanced to the second part of duodenum.                            The upper GI endoscopy was accomplished without                            difficulty. The patient tolerated the procedure                            well. Scope In:  11:35:51 AM Scope Out: 11:39:49 AM Total Procedure Duration: 0 hours 3 minutes 58 seconds  Findings:      The Z-line was regular and was found 42 cm from the incisors.      There is no endoscopic evidence of bleeding, areas of erosion,       esophagitis, ulcerations or varices in the entire esophagus.      Patchy mild inflammation characterized by erythema was found in the        entire examined stomach. Biopsies were taken with a cold forceps for       Helicobacter pylori testing.      Multiple 3 to 8 mm sessile polyps with no bleeding and no stigmata of       recent bleeding were found in the gastric body and in the gastric       antrum. Biopsies were taken with a cold forceps for histology.      The duodenal bulb, first portion of the duodenum and second portion of       the duodenum were normal. Impression:               - Z-line regular, 42 cm from the incisors.                           - Gastritis. Biopsied.                           - Multiple gastric polyps. Biopsied.                           - Normal duodenal bulb, first portion of the                            duodenum and second portion of the duodenum. Moderate Sedation:      Per Anesthesia Care Recommendation:           - Patient has a contact number available for                            emergencies. The signs and symptoms of potential                            delayed complications were discussed with the                            patient. Return to normal activities tomorrow.                            Written discharge instructions were provided to the                            patient.                           - Resume previous diet.                           - Continue present medications.                           -  Await pathology results.                           - Use Protonix (pantoprazole) 40 mg PO daily.                           - No ibuprofen, naproxen, or other non-steroidal                            anti-inflammatory drugs.                           - Return to GI clinic in 3 months. Procedure Code(s):        --- Professional ---                           [phone], Esophagogastroduodenoscopy, flexible,                            transoral; with biopsy, single or multiple Diagnosis Code(s):        --- Professional ---                           K29.70, Gastritis,  unspecified, without bleeding                           K31.7, Polyp of stomach and duodenum                           D50.9, Iron deficiency anemia, unspecified CPT copyright 2022 American Medical Association. All rights reserved. The codes documented in this report are preliminary and upon coder review may  be revised to meet current compliance requirements. Zachary Weeks. Zachary Chatters, DO [place] Zachary Chatters, DO 10/08/2022 11:42:21 AM This report has been signed electronically. Number of Addenda: 0

## 2022-10-08 NOTE — Anesthesia Postprocedure Evaluation (Signed)
Anesthesia Post Note  Patient: Zachary Weeks  Procedure(s) Performed: COLONOSCOPY WITH PROPOFOL ESOPHAGOGASTRODUODENOSCOPY (EGD) WITH PROPOFOL BIOPSY POLYPECTOMY HEMOSTASIS CLIP PLACEMENT SUBMUCOSAL LIFTING INJECTION SUBMUCOSAL TATTOO INJECTION  Patient location during evaluation: Phase II Anesthesia Type: General Level of consciousness: awake and alert Pain management: pain level controlled Vital Signs Assessment: post-procedure vital signs reviewed and stable Respiratory status: spontaneous breathing, nonlabored ventilation, respiratory function stable and patient connected to nasal cannula oxygen Cardiovascular status: blood pressure returned to baseline and stable Postop Assessment: no apparent nausea or vomiting Anesthetic complications: no   No notable events documented.   Last Vitals:  Vitals:   10/08/22 1130 10/08/22 1230  BP:  116/64  Pulse: 90 83  Resp:  17  Temp:  36.7 C  SpO2: 93% 94%    Last Pain:  Vitals:   10/08/22 1243  TempSrc:   PainSc: 0-No pain                 Erilyn Pearman Clyde Canterbury

## 2022-10-08 NOTE — Anesthesia Preprocedure Evaluation (Signed)
Anesthesia Evaluation  Patient identified by MRN, date of birth, ID band Patient awake    Reviewed: Allergy & Precautions, H&P , NPO status , Patient's Chart, lab work & pertinent test results, reviewed documented beta blocker date and time   Airway Mallampati: II  TM Distance: >3 FB Neck ROM: full    Dental no notable dental hx.    Pulmonary asthma , former smoker Sarcoidosis   Pulmonary exam normal breath sounds clear to auscultation       Cardiovascular Exercise Tolerance: Good hypertension, + CAD and + Past MI (2022)   Rhythm:regular Rate:Normal     Neuro/Psych negative neurological ROS  negative psych ROS   GI/Hepatic Neg liver ROS,GERD  ,,  Endo/Other  negative endocrine ROS    Renal/GU CRFRenal disease  negative genitourinary   Musculoskeletal   Abdominal   Peds  Hematology negative hematology ROS (+)   Anesthesia Other Findings BMI 45.08 ECHO 2022 60-65% Hx of DVT  Reproductive/Obstetrics negative OB ROS                             Anesthesia Physical Anesthesia Plan  ASA: 3  Anesthesia Plan: General   Post-op Pain Management:    Induction:   PONV Risk Score and Plan:   Airway Management Planned:   Additional Equipment:   Intra-op Plan:   Post-operative Plan:   Informed Consent: I have reviewed the patients History and Physical, chart, labs and discussed the procedure including the risks, benefits and alternatives for the proposed anesthesia with the patient or authorized representative who has indicated his/her understanding and acceptance.     Dental Advisory Given  Plan Discussed with: CRNA  Anesthesia Plan Comments:         Anesthesia Quick Evaluation

## 2022-10-08 NOTE — Interval H&P Note (Signed)
History and Physical Interval Note:  10/08/2022 11:14 AM  Zachary Weeks  has presented today for surgery, with the diagnosis of Screening, low iron.  The various methods of treatment have been discussed with the patient and family. After consideration of risks, benefits and other options for treatment, the patient has consented to  Procedure(s) with comments: COLONOSCOPY WITH PROPOFOL (N/A) - 11:30 AM ESOPHAGOGASTRODUODENOSCOPY (EGD) WITH PROPOFOL (N/A) as a surgical intervention.  The patient's history has been reviewed, patient examined, no change in status, stable for surgery.  I have reviewed the patient's chart and labs.  Questions were answered to the patient's satisfaction.     Eloise Harman

## 2022-10-08 NOTE — Op Note (Addendum)
[facility] Patient Name: Zachary Weeks Procedure Date: 10/08/2022 11:42 AM MRN: [id number] Date of Birth: December 30, 1980 Attending MD: Elon Alas. Edgar Frisk, [id number] CSN: [id number] Age: 41 Admit Type: Outpatient Procedure:                Colonoscopy Indications:              Iron deficiency anemia Providers:                Elon Alas. [name], DO, Lambert Mody, Dereck Leep, Technician Referring MD:              Medicines:                See the Anesthesia note for documentation of the                            administered medications Complications:            No immediate complications. Estimated Blood Loss:     Estimated blood loss was minimal. Procedure:                Pre-Anesthesia Assessment:                           - The anesthesia plan was to use monitored                            anesthesia care (MAC).                           After obtaining informed consent, the colonoscope                            was passed under direct vision. Throughout the                            procedure, the patient's blood pressure, pulse, and                            oxygen saturations were monitored continuously. The                            [id number]-HQ190L ([id number]) scope was introduced through                            the anus and advanced to the the cecum, identified                            by appendiceal orifice and ileocecal valve. The                            colonoscopy was performed without difficulty. The                            patient tolerated the procedure well. The  quality                            of the bowel preparation was evaluated using the                            BBPS [facility] Bowel Preparation Scale) with scores                            of: Right Colon = 3, Transverse Colon = 3 and Left                            Colon = 3 (entire mucosa seen well with no residual                            staining,  small fragments of stool or opaque                            liquid). The total BBPS score equals 9. Scope In: 11:44:23 AM Scope Out: 12:22:32 PM Scope Withdrawal Time: 0 hours 36 minutes 31 seconds  Total Procedure Duration: 0 hours 38 minutes 9 seconds  Findings:      The perianal and digital rectal examinations were normal.      Non-bleeding internal hemorrhoids were found during endoscopy.      A 20 mm polyp was found in the hepatic flexure. The polyp was sessile.       Preparations were made for mucosal resection. Demarcation of the lesion       was performed with narrow band imaging to clearly identify the       boundaries of the lesion. [place]view was injected with adequate lift of the       lesion from the muscularis propria. Snare mucosal resection with Jabier Mutton       net retrieval was performed. Resection and retrieval were complete.       Resected tissue margins were examined and clear of polyp tissue. There       was no bleeding at the end of the procedure. To close a defect after       polypectomy, three hemostatic clips were successfully placed (MR       conditional). Clip manufacturer: [employer]. There was no       bleeding at the end of the procedure.      Four sessile polyps were found in the descending colon and transverse       colon. The polyps were 4 to 7 mm in size. These polyps were removed with       a cold snare. Resection and retrieval were complete.      A 25 mm polyp was found in the descending colon. The polyp was       pedunculated. The polyp was removed with a hot snare. Resection and       retrieval were complete. To close a defect after polypectomy, one       hemostatic clip was successfully placed (MR conditional). Clip       manufacturer: [employer]. There was no bleeding at the end of the       procedure. Area distal was tattooed with an injection of Spot (carbon  black).      Two sessile polyps were found in the recto-sigmoid colon. The  polyps       were 4 to 6 mm in size. These polyps were removed with a cold snare.       Resection and retrieval were complete. Impression:               - Non-bleeding internal hemorrhoids.                           - One 20 mm polyp at the hepatic flexure, removed                            with mucosal resection. Resected and retrieved.                            Clips (MR conditional) were placed. Clip                            manufacturer: [employer].                           - Four 4 to 7 mm polyps in the descending colon and                            in the transverse colon, removed with a cold snare.                            Resected and retrieved.                           - One 25 mm polyp in the descending colon, removed                            with a hot snare. Resected and retrieved. Clip (MR                            conditional) was placed. Clip manufacturer: [employer]. Tattooed.                           - Two 4 to 6 mm polyps at the recto-sigmoid colon,                            removed with a cold snare. Resected and retrieved.                           - Mucosal resection was performed. Resection and                            retrieval were complete. Moderate Sedation:      Per Anesthesia Care Recommendation:           - Patient has a contact  number available for                            emergencies. The signs and symptoms of potential                            delayed complications were discussed with the                            patient. Return to normal activities tomorrow.                            Written discharge instructions were provided to the                            patient.                           - Resume previous diet.                           - Continue present medications.                           - Await pathology results.                           - Repeat colonoscopy date to be  determined after                            pending pathology results are reviewed for                            surveillance based on pathology results.                           - Return to GI clinic in 3 months.                           - PATIENT WILL NEED KUB PRIOR TO ANY MRI DONE IN                            THE FUTURE DUE TO CLIPS PLACED TODAY Procedure Code(s):        --- Professional ---                           [phone], Colonoscopy, flexible; with endoscopic                            mucosal resection                           [id number], 63, Colonoscopy, flexible; with removal of                            tumor(s), polyp(s), or other lesion(s) by snare  technique                           [id number], 59, Colonoscopy, flexible; with directed                            submucosal injection(s), any substance Diagnosis Code(s):        --- Professional ---                           D12.3, Benign neoplasm of transverse colon (hepatic                            flexure or splenic flexure)                           D12.4, Benign neoplasm of descending colon                           D12.7, Benign neoplasm of rectosigmoid junction                           K64.8, Other hemorrhoids                           D50.9, Iron deficiency anemia, unspecified CPT copyright 2022 American Medical Association. All rights reserved. The codes documented in this report are preliminary and upon coder review may  be revised to meet current compliance requirements. Elon Alas. [name], DO [place] [name], DO 10/08/2022 12:28:04 PM This report has been signed electronically. Number of Addenda: 0

## 2022-10-08 NOTE — Discharge Instructions (Addendum)
EGD Discharge instructions Please read the instructions outlined below and refer to this sheet in the next few weeks. These discharge instructions provide you with general information on caring for yourself after you leave the hospital. Your doctor may also give you specific instructions. While your treatment has been planned according to the most current medical practices available, unavoidable complications occasionally occur. If you have any problems or questions after discharge, please call your doctor. ACTIVITY You may resume your regular activity but move at a slower pace for the next 24 hours.  Take frequent rest periods for the next 24 hours.  Walking will help expel (get rid of) the air and reduce the bloated feeling in your abdomen.  No driving for 24 hours (because of the anesthesia (medicine) used during the test).  You may shower.  Do not sign any important legal documents or operate any machinery for 24 hours (because of the anesthesia used during the test).  NUTRITION Drink plenty of fluids.  You may resume your normal diet.  Begin with a light meal and progress to your normal diet.  Avoid alcoholic beverages for 24 hours or as instructed by your caregiver.  MEDICATIONS You may resume your normal medications unless your caregiver tells you otherwise.  WHAT YOU CAN EXPECT TODAY You may experience abdominal discomfort such as a feeling of fullness or "gas" pains.  FOLLOW-UP Your doctor will discuss the results of your test with you.  SEEK IMMEDIATE MEDICAL ATTENTION IF ANY OF THE FOLLOWING OCCUR: Excessive nausea (feeling sick to your stomach) and/or vomiting.  Severe abdominal pain and distention (swelling).  Trouble swallowing.  Temperature over 101 F (37.8 C).  Rectal bleeding or vomiting of blood.     Colonoscopy Discharge Instructions  Read the instructions outlined below and refer to this sheet in the next few weeks. These discharge instructions provide you with  general information on caring for yourself after you leave the hospital. Your doctor may also give you specific instructions. While your treatment has been planned according to the most current medical practices available, unavoidable complications occasionally occur.   ACTIVITY You may resume your regular activity, but move at a slower pace for the next 24 hours.  Take frequent rest periods for the next 24 hours.  Walking will help get rid of the air and reduce the bloated feeling in your belly (abdomen).  No driving for 24 hours (because of the medicine (anesthesia) used during the test).   Do not sign any important legal documents or operate any machinery for 24 hours (because of the anesthesia used during the test).  NUTRITION Drink plenty of fluids.  You may resume your normal diet as instructed by your doctor.  Begin with a light meal and progress to your normal diet. Heavy or fried foods are harder to digest and may make you feel sick to your stomach (nauseated).  Avoid alcoholic beverages for 24 hours or as instructed.  MEDICATIONS You may resume your normal medications unless your doctor tells you otherwise.  WHAT YOU CAN EXPECT TODAY Some feelings of bloating in the abdomen.  Passage of more gas than usual.  Spotting of blood in your stool or on the toilet paper.  IF YOU HAD POLYPS REMOVED DURING THE COLONOSCOPY: No aspirin products for 7 days or as instructed.  No alcohol for 7 days or as instructed.  Eat a soft diet for the next 24 hours.  FINDING OUT THE RESULTS OF YOUR TEST Not all test results are  available during your visit. If your test results are not back during the visit, make an appointment with your caregiver to find out the results. Do not assume everything is normal if you have not heard from your caregiver or the medical facility. It is important for you to follow up on all of your test results.  SEEK IMMEDIATE MEDICAL ATTENTION IF: You have more than a spotting of  blood in your stool.  Your belly is swollen (abdominal distention).  You are nauseated or vomiting.  You have a temperature over 101.  You have abdominal pain or discomfort that is severe or gets worse throughout the day.   Your EGD revealed mild amount inflammation in your stomach.  I took biopsies of this to rule out infection with a bacteria called H. pylori.  Await pathology results, my office will contact you.  Your esophagus and small bowel appeared normal.  Few small polyps in your stomach these are likely benign, hyperplastic.  I did sample them.  Your colonoscopy revealed 8 polyps.  2 were quite large requiring in depth resection.  I was able to successfully remove all of them.  I did place 4 metallic clips in your colon to close defect after polypectomy.  These will naturally fall off.  If you need an MRI for any reason in the near future, you will need to have x-ray done prior.  Await pathology results.  Repeat colonoscopy timing to be determined.  Follow-up with GI in 3 months. Office will be in contact with you to schedule this appointment.   I hope you have a great rest of your week!  Elon Alas. [name], D.O. Gastroenterology and Hepatology [facility]

## 2022-10-08 NOTE — Transfer of Care (Signed)
Immediate Anesthesia Transfer of Care Note  Patient: Zachary Weeks  Procedure(s) Performed: COLONOSCOPY WITH PROPOFOL ESOPHAGOGASTRODUODENOSCOPY (EGD) WITH PROPOFOL BIOPSY POLYPECTOMY HEMOSTASIS CLIP PLACEMENT SUBMUCOSAL LIFTING INJECTION SUBMUCOSAL TATTOO INJECTION  Patient Location: PACU  Anesthesia Type:General  Level of Consciousness: drowsy  Airway & Oxygen Therapy: Patient Spontanous Breathing and Patient connected to face mask oxygen  Post-op Assessment: Report given to RN and Post -op Vital signs reviewed and stable  Post vital signs: Reviewed and stable  Last Vitals:  Vitals Value Taken Time  BP 116/64   Temp 98.1   Pulse 83 10/08/22 1228  Resp 17 10/08/22 1228  SpO2 94 % 10/08/22 1228  Vitals shown include unvalidated device data.  Last Pain:  Vitals:   10/08/22 1133  TempSrc:   PainSc: 0-No pain      Patients Stated Pain Goal: 6 (16/10/96 0454)  Complications: No notable events documented.

## 2022-10-12 LAB — SURGICAL PATHOLOGY

## 2022-10-19 ENCOUNTER — Encounter (HOSPITAL_COMMUNITY): Payer: Self-pay | Admitting: Internal Medicine

## 2022-10-20 ENCOUNTER — Telehealth: Payer: Self-pay | Admitting: Internal Medicine

## 2022-10-20 DIAGNOSIS — B9681 Helicobacter pylori [H. pylori] as the cause of diseases classified elsewhere: Secondary | ICD-10-CM

## 2022-10-20 MED ORDER — METRONIDAZOLE 500 MG PO TABS: 500.0000 mg | ORAL_TABLET | Freq: Three times a day (TID) | ORAL | 0 refills | Status: AC

## 2022-10-20 MED ORDER — PANTOPRAZOLE SODIUM 40 MG PO TBEC: 40.0000 mg | DELAYED_RELEASE_TABLET | Freq: Two times a day (BID) | ORAL | 5 refills | Status: DC

## 2022-10-20 MED ORDER — BISMUTH 262 MG PO CHEW: 2.0000 | CHEWABLE_TABLET | Freq: Four times a day (QID) | ORAL | 0 refills | Status: AC

## 2022-10-20 MED ORDER — TETRACYCLINE HCL 500 MG PO CAPS: 500.0000 mg | ORAL_CAPSULE | Freq: Four times a day (QID) | ORAL | 0 refills | Status: AC

## 2022-10-20 NOTE — Telephone Encounter (Signed)
Patient has H. pylori gastritis.  I will send in 14-day course of tetracycline 500 mg 4 times daily, metronidazole 500 mg 3 times daily.   I am going to increase his pantoprazole to 40 mg twice daily.   I will also send in bismuth to take 4 times daily as well.    Follow-up as previously scheduled to discuss eradication testing.  Polyps removed were tubular adenomas and one sessile serrated polyp. Repeat colonoscopy in 3  years.  Thank you

## 2022-10-21 NOTE — Telephone Encounter (Signed)
noted 

## 2022-10-21 NOTE — Telephone Encounter (Signed)
Phoned and advised the pt of both of his result notes and Rx's being sent in for his H Pylori gastritis. Advised to schedule appt for eradication testing. Pt expressed understanding.   Manuela Schwartz please NIC for 3 year repeat  colonoscopy

## 2022-12-02 ENCOUNTER — Ambulatory Visit (INDEPENDENT_AMBULATORY_CARE_PROVIDER_SITE_OTHER): Payer: Managed Care, Other (non HMO) | Admitting: Internal Medicine

## 2022-12-02 ENCOUNTER — Encounter: Payer: Self-pay | Admitting: Internal Medicine

## 2022-12-02 VITALS — BP 113/79 | HR 99 | Temp 98.3°F | Ht 72.0 in | Wt 343.2 lb

## 2022-12-02 DIAGNOSIS — K2970 Gastritis, unspecified, without bleeding: Secondary | ICD-10-CM | POA: Diagnosis not present

## 2022-12-02 DIAGNOSIS — D123 Benign neoplasm of transverse colon: Secondary | ICD-10-CM

## 2022-12-02 DIAGNOSIS — E611 Iron deficiency: Secondary | ICD-10-CM | POA: Diagnosis not present

## 2022-12-02 DIAGNOSIS — R197 Diarrhea, unspecified: Secondary | ICD-10-CM | POA: Diagnosis not present

## 2022-12-02 DIAGNOSIS — B9681 Helicobacter pylori [H. pylori] as the cause of diseases classified elsewhere: Secondary | ICD-10-CM

## 2022-12-02 DIAGNOSIS — K219 Gastro-esophageal reflux disease without esophagitis: Secondary | ICD-10-CM

## 2022-12-02 NOTE — Patient Instructions (Signed)
I am going to order H. pylori breath test at [facility] to check for eradication of H. pylori.  You need to hold your pantoprazole for at least 10 days prior to testing.  You can take Pepcid during this time.  Once you have completed your breath test, you can immediately restart your pantoprazole.  Will continue to monitor your diarrhea for now.  You can take Imodium as needed to help with this.  Would also recommend starting a daily probiotic.  Will plan on repeat colonoscopy November 2026.  Follow-up with GI in 3 to 4 months.  It was good seeing you again today.  Dr. [name]

## 2022-12-02 NOTE — Progress Notes (Signed)
Referring Provider: Caryl Bis, MD Primary Care Physician:  Caryl Bis, MD Primary GI:  Dr. [name]  Chief Complaint  Patient presents with   h pylori    Follow up on h pylori gastritis. Reports since finishing antibiotics has had diarrhea and has missed some work due to diarrhea and also some vomiting.     HPI:   Zachary Weeks is a 42 y.o. male who presents to the clinic today for follow-up visit.    EGD 10/08/2022 with gastritis, biopsies positive for H. pylori.  Has completed course of quad therapy with tetracycline/metronidazole.  Currently taking pantoprazole twice daily.  Colonoscopy 10/08/2022 with multiple polyps removed.  1 large polyp at hepatic flexure sessile serrated polyp.  Required clips.  Large pedunculated descending polyp tubular adenoma.  1 smaller rectal tubular adenoma.  Recommended recall 3 years.  Today states he is doing okay.  Antibiotics were difficult on his GI tract.  Notes diarrhea keeping him out of work at 1 point.  This has steadily improved.  Notes having 1-2 bowel movements currently.  Mushy.  No profuse or watery diarrhea.  Some left upper quadrant cramping.  No melena hematochezia.  GERD symptoms well-controlled on pantoprazole.   Past Medical History:  Diagnosis Date   Asthma    DVT (deep venous thrombosis) (HCC)    Dyspnea    at times - no known reason.    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   Myocardial infarction [facility])    Pneumonia    59- 81 years of age   Pre-diabetes    Sarcoidosis    Sleep apnea    CPAP @ [facility]    Past Surgical History:  Procedure Laterality Date   BIOPSY  10/08/2022   Procedure: BIOPSY;  Surgeon: Eloise Harman, DO;  [facility];  Service: Endoscopy;;   BRONCHIAL NEEDLE ASPIRATION BIOPSY  11/01/2020   Procedure: BRONCHIAL NEEDLE ASPIRATION BIOPSIES;  Surgeon: Rigoberto Noel, MD;  Location: [facility]  ENDOSCOPY;  Service: Cardiopulmonary;;   BRONCHIAL WASHINGS  11/01/2020   Procedure: BRONCHIAL WASHINGS;  Surgeon: Rigoberto Noel, MD;  Location: [place];  Service: Cardiopulmonary;;   CARDIAC CATHETERIZATION     COLONOSCOPY WITH PROPOFOL N/A 10/08/2022   Procedure: COLONOSCOPY WITH PROPOFOL;  Surgeon: Eloise Harman, DO;  [facility];  Service: Endoscopy;  Laterality: N/A;  11:30 AM   ESOPHAGOGASTRODUODENOSCOPY (EGD) WITH PROPOFOL N/A 10/08/2022   Procedure: ESOPHAGOGASTRODUODENOSCOPY (EGD) WITH PROPOFOL;  Surgeon: Eloise Harman, DO;  [facility];  Service: Endoscopy;  Laterality: N/A;   HEMOSTASIS CLIP PLACEMENT  10/08/2022   Procedure: HEMOSTASIS CLIP PLACEMENT;  Surgeon: Eloise Harman, DO;  [facility];  Service: Endoscopy;;   INTRAVASCULAR ULTRASOUND/IVUS N/A 05/05/2021   Procedure: Intravascular Ultrasound/IVUS;  Surgeon: Nigel Mormon, MD;  Location: [place] [facility];  Service: Cardiovascular;  Laterality: N/A;   LEFT HEART CATH AND CORONARY ANGIOGRAPHY N/A 05/05/2021   Procedure: LEFT HEART CATH AND CORONARY ANGIOGRAPHY;  Surgeon: Nigel Mormon, MD;  Location: [place] [facility];  Service: Cardiovascular;  Laterality: N/A;   MEDIASTINOSCOPY N/A 07/11/2021   Procedure: MEDIASTINOSCOPY;  Surgeon: Melrose Nakayama, MD;  Location: [place];  Service: Thoracic;  Laterality: N/A;   POLYPECTOMY  10/08/2022   Procedure: POLYPECTOMY;  Surgeon: Eloise Harman, DO;  [facility];  Service: Endoscopy;;   SUBMUCOSAL LIFTING INJECTION  10/08/2022   Procedure: SUBMUCOSAL LIFTING INJECTION;  Surgeon: Eloise Harman, DO;  [facility];  Service: Endoscopy;;   SUBMUCOSAL TATTOO INJECTION  10/08/2022   Procedure: SUBMUCOSAL TATTOO INJECTION;  Surgeon: Eloise Harman, DO;  [facility];  Service: Endoscopy;;   VIDEO BRONCHOSCOPY N/A 11/01/2020   Procedure: VIDEO BRONCHOSCOPY WITH ENDOBRONCHIAL  ULTRASOUND;  Surgeon: Rigoberto Noel, MD;  Location: [place];  Service: Cardiopulmonary;  Laterality: N/A;    Current Outpatient Medications  Medication Sig Dispense Refill   albuterol (VENTOLIN HFA) 108 (90 Base) MCG/ACT inhaler Inhale 1-2 puffs into the lungs every 6 (six) hours as needed for wheezing or shortness of breath.     amLODipine (NORVASC) 5 MG tablet TAKE 1 TABLET BY MOUTH DAILY 30 tablet 5   atorvastatin (LIPITOR) 80 MG tablet TAKE 1 TABLET BY MOUTH DAILY 90 tablet 3   Cholecalciferol (VITAMIN D3) 1000 units CAPS Take 1,000 Units by mouth in the morning.     cyclobenzaprine (FLEXERIL) 10 MG tablet Take 10 mg by mouth daily as needed for muscle spasms.     metoprolol tartrate (LOPRESSOR) 25 MG tablet TAKE 1 AND 1/2 TABLETS BY MOUTH TWICE DAILY (Patient taking differently: Take 37.5 mg by mouth in the morning.) 270 tablet 2   nitroGLYCERIN (NITROSTAT) 0.4 MG SL tablet Place 1 tablet (0.4 mg total) under the tongue every 5 (five) minutes as needed for chest pain. 30 tablet 1   pantoprazole (PROTONIX) 40 MG tablet Take 1 tablet (40 mg total) by mouth 2 (two) times daily. 60 tablet 5   Semaglutide (RYBELSUS) 3 MG TABS Take 3 mg by mouth in the morning. sample (Patient not taking: Reported on 12/02/2022)     No current facility-administered medications for this visit.    Allergies as of 12/02/2022   (No Known Allergies)    Family History  Problem Relation Age of Onset   Pulmonary fibrosis Mother    Cancer Mother        cervical   Cancer Father        prostate   Hypertension Father    Lupus Maternal Grandmother    Other Maternal Grandmother        clotting disorder   Fibromyalgia Maternal Aunt    Other Other        clotting disorder   Colon cancer Neg Hx     Social History   Socioeconomic History   Marital status: Legally Separated    Spouse name: Not on file   Number of children: 2   Years of education: Not on file   Highest education level: High school  graduate  Occupational History   Not on file  Tobacco Use   Smoking status: Former    Packs/day: 1.00    Years: 3.00    Total pack years: 3.00    Types: Cigarettes    Quit date: 1999    Years since quitting: 25.0    Passive exposure: Current   Smokeless tobacco: Current    Types: Chew, Snuff  Vaping Use   Vaping Use: Never used  Substance and Sexual Activity   Alcohol use: Yes    Comment: rarely- maybe 2 mixed drinks in a year   Drug use: Never   Sexual activity: Not on file  Other Topics Concern   Not on file  Social History Narrative   Lives with family   Caffeine- occass soda  Social Determinants of Health   Financial Resource Strain: Not on file  Food Insecurity: Not on file  Transportation Needs: Not on file  Physical Activity: Not on file  Stress: Not on file  Social Connections: Not on file    Subjective: Review of Systems  Constitutional:  Negative for chills and fever.  HENT:  Negative for congestion and hearing loss.   Eyes:  Negative for blurred vision and double vision.  Respiratory:  Negative for cough and shortness of breath.   Cardiovascular:  Negative for chest pain and palpitations.  Gastrointestinal:  Positive for heartburn. Negative for abdominal pain, blood in stool, constipation, diarrhea, melena and vomiting.  Genitourinary:  Negative for dysuria and urgency.  Musculoskeletal:  Negative for joint pain and myalgias.  Skin:  Negative for itching and rash.  Neurological:  Negative for dizziness and headaches.  Psychiatric/Behavioral:  Negative for depression. The patient is not nervous/anxious.      Objective: BP 113/79 (BP Location: Left Arm, Patient Position: Sitting, Cuff Size: Large)   Pulse 99   Temp 98.3 F (36.8 C) (Oral)   Ht 6' (1.829 m)   Wt (!) 343 lb 3.2 oz (155.7 kg)   BMI 46.55 kg/m  Physical Exam Constitutional:      Appearance: Normal appearance. He is obese.  HENT:     Head: Normocephalic and atraumatic.  Eyes:      Extraocular Movements: Extraocular movements intact.     Conjunctiva/sclera: Conjunctivae normal.  Cardiovascular:     Rate and Rhythm: Normal rate and regular rhythm.  Pulmonary:     Effort: Pulmonary effort is normal.     Breath sounds: Normal breath sounds.  Abdominal:     General: Bowel sounds are normal.     Palpations: Abdomen is soft.  Musculoskeletal:        General: Normal range of motion.     Cervical back: Normal range of motion and neck supple.  Skin:    General: Skin is warm.  Neurological:     General: No focal deficit present.     Mental Status: He is alert and oriented to person, place, and time.  Psychiatric:        Mood and Affect: Mood normal.        Behavior: Behavior normal.      Assessment: *H. pylori gastritis *Chronic GERD-well-controlled pantoprazole twice daily *Adenomatous colon polyps *Diarrhea  Plan: Patient has completed treatment for his H. pylori gastritis.  Will order eradication testing with breath test at [facility].  Patient counseled to hold pantoprazole for at least 10 days prior to test.  He can restart his pantoprazole immediately afterwards.  We will call with results.  Colonoscopy recall November 2025 for surveillance purposes.  New onset diarrhea unclear.  Recent antibiotic use.  Doubt C. difficile as patient is only having 1-2 bowel movements a day.  Sometimes somewhat formed.  No profuse watery diarrhea.  Continue to monitor for now.  Imodium as needed.  Recommend he take a daily probiotic.  Follow-up in 3 to 4 months.  12/02/2022 3:35 PM   Disclaimer: This note was dictated with voice recognition software. Similar sounding words can inadvertently be transcribed and may not be corrected upon review.

## 2022-12-06 ENCOUNTER — Other Ambulatory Visit: Payer: Self-pay | Admitting: Cardiology

## 2022-12-17 LAB — H. PYLORI BREATH TEST: H pylori Breath Test: NEGATIVE

## 2022-12-31 ENCOUNTER — Ambulatory Visit: Payer: Managed Care, Other (non HMO) | Admitting: Internal Medicine

## 2023-01-21 ENCOUNTER — Ambulatory Visit: Payer: Managed Care, Other (non HMO) | Admitting: Internal Medicine

## 2023-03-24 ENCOUNTER — Ambulatory Visit (INDEPENDENT_AMBULATORY_CARE_PROVIDER_SITE_OTHER): Payer: Managed Care, Other (non HMO) | Admitting: Internal Medicine

## 2023-03-24 ENCOUNTER — Encounter: Payer: Self-pay | Admitting: Internal Medicine

## 2023-03-24 VITALS — BP 123/91 | HR 111 | Temp 98.2°F | Ht 72.0 in | Wt 338.5 lb

## 2023-03-24 DIAGNOSIS — B9681 Helicobacter pylori [H. pylori] as the cause of diseases classified elsewhere: Secondary | ICD-10-CM

## 2023-03-24 DIAGNOSIS — K219 Gastro-esophageal reflux disease without esophagitis: Secondary | ICD-10-CM | POA: Diagnosis not present

## 2023-03-24 DIAGNOSIS — D123 Benign neoplasm of transverse colon: Secondary | ICD-10-CM | POA: Diagnosis not present

## 2023-03-24 DIAGNOSIS — K2970 Gastritis, unspecified, without bleeding: Secondary | ICD-10-CM

## 2023-03-24 MED ORDER — PANTOPRAZOLE SODIUM 40 MG PO TBEC: 40.0000 mg | DELAYED_RELEASE_TABLET | Freq: Two times a day (BID) | ORAL | 11 refills | Status: DC

## 2023-03-24 NOTE — Progress Notes (Signed)
Referring Provider: Richardean Chimera, MD Primary Care Physician:  Richardean Chimera, MD Primary GI:  Dr. Marletta Lor  Chief Complaint  Patient presents with   Gastroesophageal Reflux    Follow up on GERD. Takes protonix bid.started having reflux issues when he had to stop protonix for breath test in January.     HPI:   Zachary Weeks is a 42 y.o. male who presents to the clinic today for follow-up visit.  History of chronic GERD, H. pylori gastritis, numerous adenomatous and sessile serrated polyps.  EGD 10/08/2022 with gastritis, biopsies positive for H. pylori.  Has completed course of quad therapy with tetracycline/metronidazole.  Currently taking pantoprazole twice daily. Eradication testing with H pylori breath test negative 12/15/22.  Colonoscopy 10/08/2022 with multiple polyps removed.  1 large polyp at hepatic flexure sessile serrated polyp.  Required clips.  Large pedunculated descending polyp tubular adenoma.  1 smaller rectal tubular adenoma.  Recommended recall 3 years.  Today states he is doing well. GERD relatively well controlled on pantoprazole twice daily. States the time stopping his medication for H pylori testing was tough.  Occasional breakthrough symptoms of indigestion and bloating.   Past Medical History:  Diagnosis Date   Asthma    DVT (deep venous thrombosis) (HCC)    Dyspnea    at times - no known reason.    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   Myocardial infarction [facility])    Pneumonia    28- 30 years of age   Pre-diabetes    Sarcoidosis    Sleep apnea    CPAP @ HS    Past Surgical History:  Procedure Laterality Date   BIOPSY  10/08/2022   Procedure: BIOPSY;  Surgeon: Lanelle Bal, DO;  [facility];  Service: Endoscopy;;   BRONCHIAL NEEDLE ASPIRATION BIOPSY  11/01/2020   Procedure: BRONCHIAL NEEDLE ASPIRATION BIOPSIES;  Surgeon: Oretha Milch, MD;  Location: [facility] ENDOSCOPY;  Service: Cardiopulmonary;;   BRONCHIAL WASHINGS  11/01/2020   Procedure: BRONCHIAL WASHINGS;  Surgeon: Oretha Milch, MD;  Location: [facility] ENDOSCOPY;  Service: Cardiopulmonary;;   CARDIAC CATHETERIZATION     COLONOSCOPY WITH PROPOFOL N/A 10/08/2022   Procedure: COLONOSCOPY WITH PROPOFOL;  Surgeon: Lanelle Bal, DO;  [facility];  Service: Endoscopy;  Laterality: N/A;  11:30 AM   CORONARY ULTRASOUND/IVUS N/A 05/05/2021   Procedure: Intravascular Ultrasound/IVUS;  Surgeon: Elder Negus, MD;  [facility];  Service: Cardiovascular;  Laterality: N/A;   ESOPHAGOGASTRODUODENOSCOPY (EGD) WITH PROPOFOL N/A 10/08/2022   Procedure: ESOPHAGOGASTRODUODENOSCOPY (EGD) WITH PROPOFOL;  Surgeon: Lanelle Bal, DO;  [facility];  Service: Endoscopy;  Laterality: N/A;   HEMOSTASIS CLIP PLACEMENT  10/08/2022   Procedure: HEMOSTASIS CLIP PLACEMENT;  Surgeon: Lanelle Bal, DO;  [facility];  Service: Endoscopy;;   LEFT HEART CATH AND CORONARY ANGIOGRAPHY N/A 05/05/2021   Procedure: LEFT HEART CATH AND CORONARY ANGIOGRAPHY;  Surgeon: Elder Negus, MD;  Location: [facility];  Service: Cardiovascular;  Laterality: N/A;   MEDIASTINOSCOPY N/A 07/11/2021   Procedure: MEDIASTINOSCOPY;  Surgeon: Loreli Slot, MD;  Location: [facility] OR;  Service: Thoracic;  Laterality: N/A;   POLYPECTOMY  10/08/2022   Procedure: POLYPECTOMY;  Surgeon: Lanelle Bal, DO;  [facility];  Service: Endoscopy;;   SUBMUCOSAL LIFTING INJECTION  10/08/2022   Procedure: SUBMUCOSAL LIFTING INJECTION;  Surgeon: Lanelle Bal, DO;  [facility];  Service: Endoscopy;;   SUBMUCOSAL TATTOO INJECTION  10/08/2022   Procedure: SUBMUCOSAL TATTOO INJECTION;  Surgeon: Lanelle Bal, DO;  [facility];  Service: Endoscopy;;   VIDEO BRONCHOSCOPY N/A 11/01/2020   Procedure: VIDEO BRONCHOSCOPY WITH  ENDOBRONCHIAL ULTRASOUND;  Surgeon: Oretha Milch, MD;  Location[facility] END[facility];  Service: Cardiopulmonary;  Laterality: N/A;    Current Outpatient Medications  Medication Sig Dispense Refill   albuterol (VENTOLIN HFA) 108 (90 Base) MCG/ACT inhaler Inhale 1-2 puffs into the lungs every 6 (six) hours as needed for wheezing or shortness of breath.     amLODipine (NORVASC) 5 MG tablet TAKE 1 TABLET BY MOUTH DAILY 90 tablet 1   atorvastatin (LIPITOR) 80 MG tablet TAKE 1 TABLET BY MOUTH DAILY 90 tablet 3   Cholecalciferol (VITAMIN D3) 1000 units CAPS Take 1,000 Units by mouth in the morning.     cyclobenzaprine (FLEXERIL) 10 MG tablet Take 10 mg by mouth daily as needed for muscle spasms.     metoprolol tartrate (LOPRESSOR) 25 MG tablet TAKE 1 AND 1/2 TABLETS BY MOUTH TWICE DAILY (Patient taking differently: Take 37.5 mg by mouth in the morning.) 270 tablet 2   nitroGLYCERIN (NITROSTAT) 0.4 MG SL tablet Place 1 tablet (0.4 mg total) under the tongue every 5 (five) minutes as needed for chest pain. 30 tablet 1   pantoprazole (PROTONIX) 40 MG tablet Take 1 tablet (40 mg total) by mouth 2 (two) times daily. 60 tablet 5   Semaglutide (RYBELSUS) 3 MG TABS Take 3 mg by mouth in the morning. sample     No current facility-administered medications for this visit.    Allergies as of 03/24/2023   (No Known Allergies)    Family History  Problem Relation Age of Onset   Pulmonary fibrosis Mother    Cancer Mother        cervical   Cancer Father        prostate   Hypertension Father    Lupus Maternal Grandmother    Other Maternal Grandmother        clotting disorder   Fibromyalgia Maternal Aunt    Other Other        clotting disorder   Colon cancer Neg Hx     Social History   Socioeconomic History   Marital status: Legally Separated    Spouse name: Not on file   Number of children: 2   Years of education: Not on file   Highest education level: High school graduate  Occupational History    Not on file  Tobacco Use   Smoking status: Former    Packs/day: 1.00    Years: 3.00    Additional pack years: 0.00    Total pack years: 3.00    Types: Cigarettes    Quit date: 1999    Years since quitting: 25.3    Passive exposure: Current   Smokeless tobacco: Current    Types: Chew, Snuff  Vaping Use   Vaping Use: Never used  Substance and Sexual Activity   Alcohol use: Yes    Comment: rarely- maybe 2 mixed drinks in a year   Drug use: Never   Sexual activity: Not on file  Other Topics Concern   Not on file  Social History Narrative   Lives with family   Caffeine- occass soda   Social Determinants of Corporate investment banker  Strain: Not on file  Food Insecurity: Not on file  Transportation Needs: Not on file  Physical Activity: Not on file  Stress: Not on file  Social Connections: Not on file    Subjective: Review of Systems  Constitutional:  Negative for chills and fever.  HENT:  Negative for congestion and hearing loss.   Eyes:  Negative for blurred vision and double vision.  Respiratory:  Negative for cough and shortness of breath.   Cardiovascular:  Negative for chest pain and palpitations.  Gastrointestinal:  Positive for heartburn. Negative for abdominal pain, blood in stool, constipation, diarrhea, melena and vomiting.  Genitourinary:  Negative for dysuria and urgency.  Musculoskeletal:  Negative for joint pain and myalgias.  Skin:  Negative for itching and rash.  Neurological:  Negative for dizziness and headaches.  Psychiatric/Behavioral:  Negative for depression. The patient is not nervous/anxious.      Objective: BP (!) 123/91   Pulse (!) 111   Temp 98.2 F (36.8 C) (Oral)   Ht 6' (1.829 m)   Wt (!) 338 lb 8 oz (153.5 kg)   BMI 45.91 kg/m  Physical Exam Constitutional:      Appearance: Normal appearance. He is obese.  HENT:     Head: Normocephalic and atraumatic.  Eyes:     Extraocular Movements: Extraocular movements intact.      Conjunctiva/sclera: Conjunctivae normal.  Cardiovascular:     Rate and Rhythm: Normal rate and regular rhythm.  Pulmonary:     Effort: Pulmonary effort is normal.     Breath sounds: Normal breath sounds.  Abdominal:     General: Bowel sounds are normal.     Palpations: Abdomen is soft.  Musculoskeletal:        General: Normal range of motion.     Cervical back: Normal range of motion and neck supple.  Skin:    General: Skin is warm.  Neurological:     General: No focal deficit present.     Mental Status: He is alert and oriented to person, place, and time.  Psychiatric:        Mood and Affect: Mood normal.        Behavior: Behavior normal.      Assessment: *H. pylori gastritis s/p Quad therapy and negative eradication testing *Chronic GERD-relatively well-controlled pantoprazole twice daily *Adenomatous colon polyps  Plan: GERD relatively well-controlled on pantoprazole twice daily.  Refilled today  Can take over-the-counter Tums for breakthrough symptoms.  Colonoscopy recall November 2026 for surveillance purposes.  Follow-up in 6 months or sooner if needed.  03/24/2023 10:15 AM   Disclaimer: This note was dictated with voice recognition software. Similar sounding words can inadvertently be transcribed and may not be corrected upon review.

## 2023-03-24 NOTE — Patient Instructions (Addendum)
Continue on pantoprazole  You can take over-the-counter Tums or Gas-X (simethicone) for breakthrough symptoms.  If not improved and would like to try different medication then call let us know and I will send in something else.  You will be due for surveillance colonoscopy November 2026.  Otherwise follow-up in 6 months.  It was nice seeing you again today.  Dr. Marletta Lor

## 2023-04-06 ENCOUNTER — Ambulatory Visit: Payer: Managed Care, Other (non HMO) | Admitting: Pulmonary Disease

## 2023-04-09 ENCOUNTER — Ambulatory Visit: Payer: Managed Care, Other (non HMO) | Admitting: Pulmonary Disease

## 2023-04-30 ENCOUNTER — Ambulatory Visit: Payer: Managed Care, Other (non HMO) | Admitting: Cardiology

## 2023-05-10 ENCOUNTER — Ambulatory Visit (HOSPITAL_COMMUNITY): Payer: Managed Care, Other (non HMO)

## 2023-05-20 ENCOUNTER — Encounter: Payer: Self-pay | Admitting: Cardiology

## 2023-05-20 ENCOUNTER — Ambulatory Visit: Payer: Managed Care, Other (non HMO) | Admitting: Cardiology

## 2023-05-20 ENCOUNTER — Ambulatory Visit: Payer: Managed Care, Other (non HMO) | Admitting: Urology

## 2023-05-20 VITALS — BP 124/102 | HR 115 | Ht 72.0 in | Wt 337.0 lb

## 2023-05-20 DIAGNOSIS — I2510 Atherosclerotic heart disease of native coronary artery without angina pectoris: Secondary | ICD-10-CM

## 2023-05-20 DIAGNOSIS — R000 Tachycardia, unspecified: Secondary | ICD-10-CM

## 2023-05-20 MED ORDER — ATORVASTATIN CALCIUM 80 MG PO TABS: 80.0000 mg | ORAL_TABLET | Freq: Every day | ORAL | 3 refills | Status: DC

## 2023-05-20 MED ORDER — METOPROLOL TARTRATE 50 MG PO TABS: 50.0000 mg | ORAL_TABLET | Freq: Two times a day (BID) | ORAL | 3 refills | Status: DC

## 2023-05-20 MED ORDER — AMLODIPINE BESYLATE 5 MG PO TABS: 5.0000 mg | ORAL_TABLET | Freq: Every day | ORAL | 3 refills | Status: DC

## 2023-05-20 NOTE — Progress Notes (Signed)
Follow up visit Virtual visit  Subjective:   Zachary Weeks, male    DOB: 01-18-1981, 42 y.o.   MRN: [id number]   HPI  Chief Complaint  Patient presents with   Coronary Artery Disease    42 y.o. 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  Patient has not had any chest pain or dyspnea symptoms. He has recently had back pain symptoms for which was treated with prednisone. This correlated with increase in blood sugar and TG. His back pain continues today. Heart rate is elevated, possibly as a result.    Current Outpatient Medications:    albuterol (VENTOLIN HFA) 108 (90 Base) MCG/ACT inhaler, Inhale 1-2 puffs into the lungs every 6 (six) hours as needed for wheezing or shortness of breath., Disp: , Rfl:    amLODipine (NORVASC) 5 MG tablet, TAKE 1 TABLET BY MOUTH DAILY, Disp: 90 tablet, Rfl: 1   atorvastatin (LIPITOR) 80 MG tablet, TAKE 1 TABLET BY MOUTH DAILY, Disp: 90 tablet, Rfl: 3   Cholecalciferol (VITAMIN D3) 1000 units CAPS, Take 1,000 Units by mouth in the morning., Disp: , Rfl:    cyclobenzaprine (FLEXERIL) 10 MG tablet, Take 10 mg by mouth daily as needed for muscle spasms., Disp: , Rfl:    metoprolol tartrate (LOPRESSOR) 25 MG tablet, TAKE 1 AND 1/2 TABLETS BY MOUTH TWICE DAILY (Patient taking differently: Take 37.5 mg by mouth in the morning.), Disp: 270 tablet, Rfl: 2   nitroGLYCERIN (NITROSTAT) 0.4 MG SL tablet, Place 1 tablet (0.4 mg total) under the tongue every 5 (five) minutes as needed for chest pain., Disp: 30 tablet, Rfl: 1   pantoprazole (PROTONIX) 40 MG tablet, Take 1 tablet (40 mg total) by mouth 2 (two) times daily., Disp: 60 tablet, Rfl: 11   Semaglutide (RYBELSUS) 3 MG TABS, Take 3 mg by mouth in the morning. sample, Disp: , Rfl:   Cardiovascular & other pertient studies:  EKG 05/20/2023: Sinus  tachycardia 109 bpm   CTA chest 06/16/2021: 1. No acute cardiopulmonary disease. Specifically, no evidence of pulmonary embolism. 2. 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 3. Borderline cardiomegaly, unchanged. 4. Minimal coronary artery calcifications. Aortic Atherosclerosis (ICD10-I70.0). 5. Suspected hepatic steatosis.  Correlation with LFTs is advised.    Echocardiogram 05/06/2021:  1. Left ventricular ejection fraction, by estimation, is 60 to 65%. The  left ventricle has normal function. The left ventricle has no regional  wall motion abnormalities. There is moderate asymmetric left ventricular  hypertrophy. Left ventricular  diastolic parameters were normal.   2. Right ventricular systolic function is normal. The right ventricular  size is normal.   3. No significant valvular abnormality.   4. The inferior vena cava is normal in size with greater than 50%  respiratory variability, suggesting right atrial pressure of 3 mmHg.   Coronary angiogram 05/05/2021: LM:  Very large. Normal LAD: Very large. 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 Normal RCA: Very large. Difficult to opacify distally, but probably normal.   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 No treatable lesion identified. Recommend DAPT with Aspirin and brilinta.   EKG  05/04/2021: Sinus tachycardia 114 bpm No acute ischemic changes     Recent labs: Jan-June 2024: Glucose 308, BUN/Cr 14/1.3. EGFR 66. K 4.0. Hb 17.2 Chol 204, TG 495, HDL 25, LDL NA TSH 2.1 normal  02/2022: HbA1C 6.1%   Review of Systems  Cardiovascular:  Negative for chest pain (Right sided,  occasional), dyspnea on exertion, leg swelling, palpitations and syncope.  Musculoskeletal:  Positive for back pain.  Neurological:  Positive for paresthesias.        Today's Vitals   05/20/23 1112 05/20/23 1117  BP: (!) 134/97 (!) 124/102  Pulse: (!) 115 (!) 115  SpO2:  92%  Weight: (!) 337 lb (152.9 kg)   Height: 6' (1.829 m)    Body mass index is 45.71 kg/m.     Body mass index is 45.71 kg/m. Filed Weights   05/20/23 1112  Weight: (!) 337 lb (152.9 kg)    Objective:   Physical Exam Vitals and nursing note reviewed.  Constitutional:      General: He is not in acute distress.    Appearance: He is well-developed. He is obese.  Neck:     Vascular: No JVD.  Cardiovascular:     Rate and Rhythm: Regular rhythm. Tachycardia present.     Heart sounds: Normal heart sounds. No murmur heard. Pulmonary:     Effort: Pulmonary effort is normal.     Breath sounds: Normal breath sounds. No wheezing or rales.  Musculoskeletal:     Right lower leg: No edema.     Left lower leg: No edema.  Neurological:     Mental Status: He is alert and oriented to person, place, and time.           Assessment & Recommendations:   42 y.o. 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   CAD 04/2021. Nonobstructive CAD with aneurysmal coronaries Completed DAPT for year. Continue Aspirin 81 mg daily. Continue metoprolol, increased to 50 mg bid. High TG and glucose in 04/2023. I hope this was related to steroid use and will improve. Discussed low fat, low carb diet. Repeat fasting lipid panel in 3 months.   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 Stable. F/u w/pulmonology.   H/o DVT: In 2020 treated with ? 6 months of anticoagulation He has 3 family members on maternal side  with with h/o lupus anticoagulant May need outpatient hematology consult. Defer to PCP.  F/u in 3 months    Elder Negus, MD Pager: [phone] Office: [phone]

## 2023-05-21 ENCOUNTER — Ambulatory Visit (HOSPITAL_COMMUNITY)
Admission: RE | Admit: 2023-05-21 | Discharge: 2023-05-21 | Disposition: A | Discharge status: Home / Self Care | Payer: Managed Care, Other (non HMO) | Source: Ambulatory Visit | Attending: Urology | Admitting: Urology

## 2023-05-21 DIAGNOSIS — N200 Calculus of kidney: Secondary | ICD-10-CM | POA: Diagnosis present

## 2023-06-03 ENCOUNTER — Ambulatory Visit: Payer: Managed Care, Other (non HMO) | Admitting: Urology

## 2023-06-10 ENCOUNTER — Encounter (INDEPENDENT_AMBULATORY_CARE_PROVIDER_SITE_OTHER): Payer: Self-pay

## 2023-06-11 ENCOUNTER — Other Ambulatory Visit: Payer: Self-pay | Admitting: Cardiology

## 2023-08-19 ENCOUNTER — Ambulatory Visit: Payer: Managed Care, Other (non HMO) | Admitting: Urology

## 2023-08-20 ENCOUNTER — Ambulatory Visit: Payer: Managed Care, Other (non HMO) | Admitting: Cardiology

## 2023-09-16 ENCOUNTER — Encounter: Payer: Self-pay | Admitting: Urology

## 2023-09-16 ENCOUNTER — Ambulatory Visit: Payer: Managed Care, Other (non HMO) | Admitting: Urology

## 2023-09-16 VITALS — BP 141/89 | HR 97 | Ht 72.0 in | Wt 337.0 lb

## 2023-09-16 DIAGNOSIS — R3915 Urgency of urination: Secondary | ICD-10-CM

## 2023-09-16 DIAGNOSIS — N200 Calculus of kidney: Secondary | ICD-10-CM

## 2023-09-16 DIAGNOSIS — R351 Nocturia: Secondary | ICD-10-CM

## 2023-09-16 DIAGNOSIS — R350 Frequency of micturition: Secondary | ICD-10-CM

## 2023-09-16 NOTE — Progress Notes (Signed)
Subjective: 1. Renal stones   2. Nocturia   3. Urinary frequency     09/16/23: Zachary Weeks returns today in f/u.  He had a renal US in June for his history of stones and he was found to have 5.65mm non-obstructing right renal stones.  He has had no flank pain or hematuria.   He has changed his fluid intake to primarily water.  His IPSS is 6 with nocturia x 4.   He is a diabetic and was told that may be contributing to the nocturia.     05/21/22: Zachary Weeks returns today in f/u.  He has a history of stones and had a CT in 9/22 that showed a 3mm left UPJ stone.   He had a renal US prior to this visit that showed no stones or hydro but there were small bilateral renal stones on the last CT.  He had a 24hr urine with Dr. Wolfgang Phoenix that was normal except for a low magnesium level and volume of 1 liter.  He is doing well without pain or hematuria.  He has stable CKD.  He has an IPSS of 5 with nocturia x 3.    05/15/21: Zachary Weeks is a 42 yo male with CKD 3 who had a renal US as part of his evaluation and was found to have bilateral non-obstructing renal stones.   He had a PET CT for adenopathy on 04/24/20 and no stones were seen.  He was in the ER on 05/05/21 had Chest tightness.  He had a CT Chest and they went ahead and did his CT AP.  He had 2 very small non-obstructing left renal stones and a few tiny RLP  and RUP stones as well.  He has some right renal atrophy.  He has had no flank pain or hematuria.   He has some increased frequency and urgency.  He has nocturia x 3.  His IPSS is 5.  He has no hesitancy or a reduced stream.  He has increased his water intake but doesn't drink coffee or tea.   He has normocalcemia.   He is currently on asa and brilinta for a possible plaque erosion with a mild MI.  He has long standing CKD3 and is seeing nephrology.  His recent PSA on 01/23/21 was only 1.0 with a 31% f/t ratio.   ROS:  Review of Systems  All other systems reviewed and are negative.   No Known Allergies  Past Medical  History:  Diagnosis Date   Asthma    DVT (deep venous thrombosis) (HCC)    Dyspnea    at times - no known reason.    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   Myocardial infarction [facility])    Pneumonia    32- 35 years of age   Pre-diabetes    Sarcoidosis    Sleep apnea    CPAP @ HS    Past Surgical History:  Procedure Laterality Date   BIOPSY  10/08/2022   Procedure: BIOPSY;  Surgeon: Lanelle Bal, DO;  Location: [facility];  Service: Endoscopy;;   BRONCHIAL NEEDLE ASPIRATION BIOPSY  11/01/2020   Procedure: BRONCHIAL NEEDLE ASPIRATION BIOPSIES;  Surgeon: Oretha Milch, MD;  Location: [facility] ENDOSCOPY;  Service: Cardiopulmonary;;   BRONCHIAL WASHINGS  11/01/2020   Procedure: BRONCHIAL WASHINGS;  Surgeon: Oretha Milch, MD;  Location: [facility] ENDOSCOPY;  Service: Cardiopulmonary;;  CARDIAC CATHETERIZATION     COLONOSCOPY WITH PROPOFOL N/A 10/08/2022   Procedure: COLONOSCOPY WITH PROPOFOL;  Surgeon: Lanelle Bal, DO;  Location: [facility];  Service: Endoscopy;  Laterality: N/A;  11:30 AM   CORONARY ULTRASOUND/IVUS N/A 05/05/2021   Procedure: Intravascular Ultrasound/IVUS;  Surgeon: Elder Negus, MD;  Location: [facility] INVASIVE CV LAB;  Service: Cardiovascular;  Laterality: N/A;   ESOPHAGOGASTRODUODENOSCOPY (EGD) WITH PROPOFOL N/A 10/08/2022   Procedure: ESOPHAGOGASTRODUODENOSCOPY (EGD) WITH PROPOFOL;  Surgeon: Lanelle Bal, DO;  Location: [facility];  Service: Endoscopy;  Laterality: N/A;   HEMOSTASIS CLIP PLACEMENT  10/08/2022   Procedure: HEMOSTASIS CLIP PLACEMENT;  Surgeon: Lanelle Bal, DO;  Location: [facility];  Service: Endoscopy;;   LEFT HEART CATH AND CORONARY ANGIOGRAPHY N/A 05/05/2021   Procedure: LEFT HEART CATH AND CORONARY ANGIOGRAPHY;  Surgeon: Elder Negus, MD;  Location: [facility] INVAS[facility] CV LAB;  Service: Cardiovascular;  Laterality: N/A;    MEDIASTINOSCOPY N/A 07/11/2021   Procedure: MEDIASTINOSCOPY;  Surgeon: Loreli Slot, MD;  Location[facility] OR;  Service: Thoracic;  Laterality: N/A;   POLYPECTOMY  10/08/2022   Procedure: POLYPECTOMY;  Surgeon: Lanelle Bal, DO;  [facility];  Service: Endoscopy;;   SUBMUCOSAL LIFTING INJECTION  10/08/2022   Procedure: SUBMUCOSAL LIFTING INJECTION;  Surgeon: Lanelle Bal, DO;  [facility];  Service: Endoscopy;;   SUBMUCOSAL TATTOO INJECTION  10/08/2022   Procedure: SUBMUCOSAL TATTOO INJECTION;  Surgeon: Lanelle Bal, DO;  Location: [facility];  Service: Endoscopy;;   VIDEO BRONCHOSCOPY N/A 11/01/2020   Procedure: VIDEO BRONCHOSCOPY WITH ENDOBRONCHIAL ULTRASOUND;  Surgeon: Oretha Milch, MD;  Location: [facility] ENDOSCOPY;  Service: Cardiopulmonary;  Laterality: N/A;    Social History   Socioeconomic History   Marital status: Legally Separated    Spouse name: Not on file   Number of children: 2   Years of education: Not on file   Highest education level: High school graduate  Occupational History   Not on file  Tobacco Use   Smoking status: Former    Current packs/day: 0.00    Average packs/day: 1 pack/day for 3.0 years (3.0 ttl pk-yrs)    Types: Cigarettes    Start date: 83    Quit date: 1999    Years since quitting: 25.8    Passive exposure: Current   Smokeless tobacco: Current    Types: Chew, Snuff  Vaping Use   Vaping status: Never Used  Substance and Sexual Activity   Alcohol use: Yes    Comment: rarely- maybe 2 mixed drinks in a year   Drug use: Never   Sexual activity: Not on file  Other Topics Concern   Not on file  Social History Narrative   Lives with family   Caffeine- occass soda   Social Determinants of Health   Financial Resource Strain: Low Risk  (02/21/2020)   [facility], [facility]   Overall Financial Resource Strain (CARDIA)    Difficulty of Paying Living Expenses: Not hard at all   Food Insecurity: No Food Insecurity (02/21/2020)   Received from [facility], [facility]   Hunger Vital Sign    Worried About Running Out of Food in the Last Year: Never true    Ran Out of Food in the Last Year: Never true  Transportation Needs: No Transportation Needs (02/21/2020)   Received from [facility], [facility]   [facility] - Transportation  Lack of Transportation (Medical): No    Lack of Transportation (Non-Medical): No  Physical Activity: Inactive (02/21/2020)   Received from [facility], [facility]   Exercise Vital Sign    Days of Exercise per Week: 0 days    Minutes of Exercise per Session: 0 min  Stress: No Stress Concern Present (02/21/2020)   Received from [facility], [facility] of Occupational Health - Occupational Stress Questionnaire    Feeling of Stress : Not at all  Social Connections: Unknown (03/25/2022)   Received from [facility], [facility]   Social Network    Social Network: Not on file  Intimate Partner Violence: Unknown (02/27/2022)   Received from [facility], [facility]   HITS    Physically Hurt: Not on file    Insult or Talk Down To: Not on file    Threaten Physical Harm: Not on file    Scream or Curse: Not on file    Family History  Problem Relation Age of Onset   Pulmonary fibrosis Mother    Cancer Mother        cervical   Cancer Father        prostate   Hypertension Father    Lupus Maternal Grandmother    Other Maternal Grandmother        clotting disorder   Fibromyalgia Maternal Aunt    Other Other        clotting disorder   Colon cancer Neg Hx     Anti-infectives: Anti-infectives (From admission, onward)    None       Current Outpatient Medications  Medication Sig Dispense Refill   albuterol (VENTOLIN HFA) 108 (90 Base) MCG/ACT inhaler Inhale 1-2 puffs into the lungs every 6 (six) hours as needed for wheezing or shortness of breath.     amLODipine (NORVASC) 5  MG tablet Take 1 tablet (5 mg total) by mouth daily. 90 tablet 3   atorvastatin (LIPITOR) 80 MG tablet Take 1 tablet (80 mg total) by mouth daily. 90 tablet 3   Cholecalciferol (VITAMIN D3) 1000 units CAPS Take 1,000 Units by mouth in the morning.     cyclobenzaprine (FLEXERIL) 10 MG tablet Take 10 mg by mouth daily as needed for muscle spasms.     metFORMIN (GLUCOPHAGE-XR) 500 MG 24 hr tablet Take 500 mg by mouth 2 (two) times daily.     metoprolol tartrate (LOPRESSOR) 50 MG tablet Take 1 tablet (50 mg total) by mouth 2 (two) times daily. 180 tablet 3   nitroGLYCERIN (NITROSTAT) 0.4 MG SL tablet Place 1 tablet (0.4 mg total) under the tongue every 5 (five) minutes as needed for chest pain. 30 tablet 1   pantoprazole (PROTONIX) 40 MG tablet Take 1 tablet (40 mg total) by mouth 2 (two) times daily. 60 tablet 11   Semaglutide (RYBELSUS) 3 MG TABS Take 3 mg by mouth in the morning. sample     No current facility-administered medications for this visit.     Objective: Vital signs in last 24 hours: BP (!) 141/89   Pulse 97   Ht 6' (1.829 m)   Wt (!) 337 lb (152.9 kg)   BMI 45.71 kg/m   Intake/Output from previous day: No intake/output data recorded. Intake/Output this shift: @IOTHISSHIFT @   Physical Exam  Lab Results:  No results found for this or any previous visit (from the past 2160 hour(s)).   Studies/Results:    No results found. CLINICAL DATA:  renal stones   EXAM: RENAL / URINARY TRACT ULTRASOUND COMPLETE   COMPARISON:  None Available.   FINDINGS: Right Kidney:   Renal measurements: 11.8 x 6.3 x 4.8 cm = volume: 186 mL. There is a 5.5 mm stone in the right kidney without hydronephrosis.   Left Kidney:   Renal measurements: 14.2 x 7.2 x 5.3 cm = volume: 282 mL. Echogenicity within normal limits. No mass or hydronephrosis visualized.   Bladder:   Appears normal for degree of bladder distention.   Other:   None.   IMPRESSION: 1. 5.5 mm stone in the  right kidney without hydronephrosis. 2. No other abnormalities.     Electronically Signed   By: Gerome Sam III M.D.   On: 05/21/2023 11:27   Assessment/Plan: Renal stones.   He has done well and only has a single small stone on [place].  F/u in 1 year with a renal US.   Frequency and urgency.   He has mild frequency and nocturia.  He wasn't able to get a UA today.     No orders of the defined types were placed in this encounter.    Orders Placed This Encounter  Procedures   US RENAL    Standing Status:   Future    Standing Expiration Date:   09/15/2024    Order Specific Question:   Reason for Exam (SYMPTOM  OR DIAGNOSIS REQUIRED)    Answer:   renal stones    Order Specific Question:   Preferred imaging location?    Answer:   [facility]     Return in about 1 year (around 09/15/2024) for with renal US. .    CC: Dr. Celso Amy and Dr. Donzetta Sprung.      Bjorn Pippin 09/17/2023 [phone]

## 2023-09-23 ENCOUNTER — Ambulatory Visit: Payer: Managed Care, Other (non HMO) | Admitting: Internal Medicine

## 2023-09-23 ENCOUNTER — Encounter: Payer: Self-pay | Admitting: Internal Medicine

## 2023-09-23 VITALS — BP 136/84 | HR 90 | Temp 98.6°F | Ht 72.0 in | Wt 330.4 lb

## 2023-09-23 DIAGNOSIS — K219 Gastro-esophageal reflux disease without esophagitis: Secondary | ICD-10-CM

## 2023-09-23 DIAGNOSIS — Z860101 Personal history of adenomatous and serrated colon polyps: Secondary | ICD-10-CM | POA: Diagnosis not present

## 2023-09-23 DIAGNOSIS — D123 Benign neoplasm of transverse colon: Secondary | ICD-10-CM

## 2023-09-23 DIAGNOSIS — Z8619 Personal history of other infectious and parasitic diseases: Secondary | ICD-10-CM

## 2023-09-23 DIAGNOSIS — Z6841 Body Mass Index (BMI) 40.0 and over, adult: Secondary | ICD-10-CM | POA: Diagnosis not present

## 2023-09-23 DIAGNOSIS — B9681 Helicobacter pylori [H. pylori] as the cause of diseases classified elsewhere: Secondary | ICD-10-CM

## 2023-09-23 DIAGNOSIS — E669 Obesity, unspecified: Secondary | ICD-10-CM

## 2023-09-23 DIAGNOSIS — E66813 Obesity, class 3: Secondary | ICD-10-CM

## 2023-09-23 DIAGNOSIS — K2970 Gastritis, unspecified, without bleeding: Secondary | ICD-10-CM

## 2023-09-23 NOTE — Progress Notes (Signed)
Referring Provider: Richardean Chimera, MD Primary Care Physician:  Richardean Chimera, MD Primary GI:  Dr. Marletta Lor  Chief Complaint  Patient presents with   Follow-up    Follow up on gastritis    HPI:   Zachary Weeks is a 42 y.o. male who presents to the clinic today for follow-up visit.  History of chronic GERD, H. pylori gastritis, numerous adenomatous and sessile serrated polyps.  EGD 10/08/2022 with gastritis, biopsies positive for H. pylori.  Has completed course of quad therapy with tetracycline/metronidazole.  Currently taking pantoprazole twice daily. Eradication testing with H pylori breath test negative 12/15/22.  Colonoscopy 10/08/2022 with multiple polyps removed.  1 large polyp at hepatic flexure sessile serrated polyp.  Required clips.  Large pedunculated descending polyp tubular adenoma.  1 smaller rectal tubular adenoma.  Recommended recall 3 years.  Today states he is doing well. GERD relatively well controlled on pantoprazole twice daily. States the time stopping his medication for H pylori testing was tough.  Occasional breakthrough symptoms of indigestion and bloating.   Past Medical History:  Diagnosis Date   Asthma    DVT (deep venous thrombosis) (HCC)    Dyspnea    at times - no known reason.    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   Myocardial infarction [facility])    Pneumonia    30- 1 years of age   Pre-diabetes    Sarcoidosis    Sleep apnea    CPAP @ HS    Past Surgical History:  Procedure Laterality Date   BIOPSY  10/08/2022   Procedure: BIOPSY;  Surgeon: Lanelle Bal, DO;  Location: [facility];  Service: Endoscopy;;   BRONCHIAL NEEDLE ASPIRATION BIOPSY  11/01/2020   Procedure: BRONCHIAL NEEDLE ASPIRATION BIOPSIES;  Surgeon: Oretha Milch, MD;  Location: [facility] ENDOSCOPY;  Service: Cardiopulmonary;;   BRONCHIAL WASHINGS  11/01/2020   Procedure: BRONCHIAL  WASHINGS;  Surgeon: Oretha Milch, MD;  Location: [facility] ENDOSCOPY;  Service: Cardiopulmonary;;   CARDIAC CATHETERIZATION     COLONOSCOPY WITH PROPOFOL N/A 10/08/2022   Procedure: COLONOSCOPY WITH PROPOFOL;  Surgeon: Lanelle Bal, DO;  Location: [facility];  Service: Endoscopy;  Laterality: N/A;  11:30 AM   CORONARY ULTRASOUND/IVUS N/A 05/05/2021   Procedure: Intravascular Ultrasound/IVUS;  Surgeon: Elder Negus, MD;  Location: [facility] INVASIVE CV LAB;  Service: Cardiovascular;  Laterality: N/A;   ESOPHAGOGASTRODUODENOSCOPY (EGD) WITH PROPOFOL N/A 10/08/2022   Procedure: ESOPHAGOGASTRODUODENOSCOPY (EGD) WITH PROPOFOL;  Surgeon: Lanelle Bal, DO;  Location: [facility];  Service: Endoscopy;  Laterality: N/A;   HEMOSTASIS CLIP PLACEMENT  10/08/2022   Procedure: HEMOSTASIS CLIP PLACEMENT;  Surgeon: Lanelle Bal, DO;  [facility];  Service: Endoscopy;;   LEFT HEART CATH AND CORONARY ANGIOGRAPHY N/A 05/05/2021   Procedure: LEFT HEART CATH AND CORONARY ANGIOGRAPHY;  Surgeon: Elder Negus, MD;  Location: [facility] INVASIVE CV LAB;  Service: Cardiovascular;  Laterality: N/A;   MEDIASTINOSCOPY N/A 07/11/2021   Procedure: MEDIASTINOSCOPY;  Surgeon: Loreli Slot, MD;  Location[facility] OR;  Service: Thoracic;  Laterality: N/A;   POLYPECTOMY  10/08/2022   Procedure: POLYPECTOMY;  Surgeon: Lanelle Bal, DO;  [facility];  Service: Endoscopy;;   SUBMUCOSAL LIFTING INJECTION  10/08/2022   Procedure: SUBMUCOSAL LIFTING INJECTION;  Surgeon: Lanelle Bal, DO;  [facility];  Service: Endoscopy;;   SUBMUCOSAL TATTOO INJECTION  10/08/2022   Procedure: SUBMUCOSAL TATTOO INJECTION;  Surgeon: Lanelle Bal, DO;  [facility];  Service: Endoscopy;;   VIDEO BRONCHOSCOPY N/A 11/01/2020   Procedure: VIDEO BRONCHOSCOPY WITH ENDOBRONCHIAL ULTRASOUND;  Surgeon: Oretha Milch, MD;  Location: [facility] ENDOSCOPY;  Service: Cardiopulmonary;   Laterality: N/A;    Current Outpatient Medications  Medication Sig Dispense Refill   albuterol (VENTOLIN HFA) 108 (90 Base) MCG/ACT inhaler Inhale 1-2 puffs into the lungs every 6 (six) hours as needed for wheezing or shortness of breath.     amLODipine (NORVASC) 5 MG tablet Take 1 tablet (5 mg total) by mouth daily. 90 tablet 3   atorvastatin (LIPITOR) 80 MG tablet Take 1 tablet (80 mg total) by mouth daily. 90 tablet 3   Cholecalciferol (VITAMIN D3) 1000 units CAPS Take 1,000 Units by mouth in the morning.     cyclobenzaprine (FLEXERIL) 10 MG tablet Take 10 mg by mouth daily as needed for muscle spasms.     metFORMIN (GLUCOPHAGE-XR) 500 MG 24 hr tablet Take 500 mg by mouth 2 (two) times daily.     metoprolol tartrate (LOPRESSOR) 50 MG tablet Take 1 tablet (50 mg total) by mouth 2 (two) times daily. 180 tablet 3   nitroGLYCERIN (NITROSTAT) 0.4 MG SL tablet Place 1 tablet (0.4 mg total) under the tongue every 5 (five) minutes as needed for chest pain. 30 tablet 1   pantoprazole (PROTONIX) 40 MG tablet Take 1 tablet (40 mg total) by mouth 2 (two) times daily. 60 tablet 11   Semaglutide (RYBELSUS) 3 MG TABS Take 3 mg by mouth in the morning. sample (Patient not taking: Reported on 09/23/2023)     No current facility-administered medications for this visit.    Allergies as of 09/23/2023   (No Known Allergies)    Family History  Problem Relation Age of Onset   Pulmonary fibrosis Mother    Cancer Mother        cervical   Cancer Father        prostate   Hypertension Father    Lupus Maternal Grandmother    Other Maternal Grandmother        clotting disorder   Fibromyalgia Maternal Aunt    Other Other        clotting disorder   Colon cancer Neg Hx     Social History   Socioeconomic History   Marital status: Legally Separated    Spouse name: Not on file   Number of children: 2   Years of education: Not on file   Highest education level: High school graduate  Occupational  History   Not on file  Tobacco Use   Smoking status: Former    Current packs/day: 0.00    Average packs/day: 1 pack/day for 3.0 years (3.0 ttl pk-yrs)    Types: Cigarettes    Start date: 94    Quit date: 1999    Years since quitting: 25.8    Passive exposure: Current   Smokeless tobacco: Current    Types: Chew, Snuff  Vaping Use   Vaping status: Never Used  Substance and Sexual Activity   Alcohol use: Yes    Comment: rarely- maybe 2 mixed drinks in a year   Drug use: Never   Sexual activity: Not on file  Other Topics Concern   Not on file  Social History Narrative   Lives with family   Caffeine- occass soda   Social Determinants  of Health   Financial Resource Strain: Low Risk  (02/21/2020)   Received from [facility], [facility]   Overall Financial Resource Strain (CARDIA)    Difficulty of Paying Living Expenses: Not hard at all  Food Insecurity: No Food Insecurity (02/21/2020)   Received from [facility], [facility]   Hunger Vital Sign    Worried About Running Out of Food in the Last Year: Never true    Ran Out of Food in the Last Year: Never true  Transportation Needs: No Transportation Needs (02/21/2020)   Received from [facility], [facility]   [facility] - Transportation    Lack of Transportation (Medical): No    Lack of Transportation (Non-Medical): No  Physical Activity: Inactive (02/21/2020)   Received from [facility], [facility]   Exercise Vital Sign    Days of Exercise per Week: 0 days    Minutes of Exercise per Session: 0 min  Stress: No Stress Concern Present (02/21/2020)   Received from [facility], [facility] - Occupational Stress Questionnaire    Feeling of Stress : Not at all  Social Connections: Unknown (03/25/2022)   Received from [facility], [facility]   Social Network    Social Network: Not on file    Subjective: Review of Systems  Constitutional:   Negative for chills and fever.  HENT:  Negative for congestion and hearing loss.   Eyes:  Negative for blurred vision and double vision.  Respiratory:  Negative for cough and shortness of breath.   Cardiovascular:  Negative for chest pain and palpitations.  Gastrointestinal:  Positive for heartburn. Negative for abdominal pain, blood in stool, constipation, diarrhea, melena and vomiting.  Genitourinary:  Negative for dysuria and urgency.  Musculoskeletal:  Negative for joint pain and myalgias.  Skin:  Negative for itching and rash.  Neurological:  Negative for dizziness and headaches.  Psychiatric/Behavioral:  Negative for depression. The patient is not nervous/anxious.      Objective: BP 136/84   Pulse 90   Temp 98.6 F (37 C)   Ht 6' (1.829 m)   Wt (!) 330 lb 6.4 oz (149.9 kg)   BMI 44.81 kg/m  Physical Exam Constitutional:      Appearance: Normal appearance. He is obese.  HENT:     Head: Normocephalic and atraumatic.  Eyes:     Extraocular Movements: Extraocular movements intact.     Conjunctiva/sclera: Conjunctivae normal.  Cardiovascular:     Rate and Rhythm: Normal rate and regular rhythm.  Pulmonary:     Effort: Pulmonary effort is normal.     Breath sounds: Normal breath sounds.  Abdominal:     General: Bowel sounds are normal.     Palpations: Abdomen is soft.  Musculoskeletal:        General: Normal range of motion.     Cervical back: Normal range of motion and neck supple.  Skin:    General: Skin is warm.  Neurological:     General: No focal deficit present.     Mental Status: He is alert and oriented to person, place, and time.  Psychiatric:        Mood and Affect: Mood normal.        Behavior: Behavior normal.      Assessment: *H. pylori gastritis s/p Quad therapy and negative eradication testing *Chronic GERD-relatively well-controlled pantoprazole twice daily *Adenomatous colon polyps *Obesity  Plan:  GERD relatively well-controlled on  pantoprazole twice daily.  Refilled today  Can take over-the-counter Tums for breakthrough symptoms.  Colonoscopy recall November 2026 for surveillance purposes.  Recommend 1-2# weight loss per week until ideal body weight through exercise & diet. Low fat/cholesterol diet.   Avoid sweets, sodas, fruit juices, sweetened beverages like tea, etc. Gradually increase exercise from 15 min daily up to 1 hr per day 5 days/week. Limit alcohol use.  Follow-up in 1 year or sooner if needed.  09/23/2023 9:42 AM   Disclaimer: This note was dictated with voice recognition software. Similar sounding words can inadvertently be transcribed and may not be corrected upon review.

## 2023-09-23 NOTE — Patient Instructions (Addendum)
I am happy to hear that you are doing well.  Continue on pantoprazole twice daily.  We will plan on colonoscopy 2026 for polyp surveillance.  Recommend 1-2# weight loss per week until ideal body weight through exercise & diet. Low fat/cholesterol diet.   Avoid sweets, sodas, fruit juices, sweetened beverages like tea, etc. Gradually increase exercise from 15 min daily up to 1 hr per day 5 days/week. Limit alcohol use.   Follow-up in 1 year or sooner if needed.  It was very nice seeing you again today.  Dr. Marletta Lor

## 2023-11-26 ENCOUNTER — Other Ambulatory Visit: Payer: Self-pay

## 2023-11-26 ENCOUNTER — Encounter (HOSPITAL_COMMUNITY): Payer: Self-pay

## 2023-11-26 ENCOUNTER — Emergency Department (HOSPITAL_COMMUNITY): Payer: Managed Care, Other (non HMO)

## 2023-11-26 ENCOUNTER — Emergency Department (HOSPITAL_COMMUNITY)
Admission: EM | Admit: 2023-11-26 | Discharge: 2023-11-27 | Disposition: A | Discharge status: Home / Self Care | Payer: Managed Care, Other (non HMO) | Attending: Emergency Medicine | Admitting: Emergency Medicine

## 2023-11-26 DIAGNOSIS — R42 Dizziness and giddiness: Secondary | ICD-10-CM | POA: Diagnosis present

## 2023-11-26 DIAGNOSIS — R739 Hyperglycemia, unspecified: Secondary | ICD-10-CM | POA: Insufficient documentation

## 2023-11-26 LAB — URINALYSIS, ROUTINE W REFLEX MICROSCOPIC
Bacteria, UA: NONE SEEN
Bilirubin Urine: NEGATIVE
Glucose, UA: >=500 mg/dL — AB
Hgb urine dipstick: NEGATIVE
Ketones, ur: NEGATIVE mg/dL
Leukocytes,Ua: NEGATIVE
Nitrite: NEGATIVE
Protein, ur: 30 mg/dL — AB
Specific Gravity, Urine: 1.028 (ref 1.005–1.030)
pH: 5 (ref 5.0–8.0)

## 2023-11-26 LAB — CBC WITH DIFFERENTIAL/PLATELET
Abs Immature Granulocytes: 0.04 10*3/uL (ref 0.00–0.07)
Basophils Absolute: 0.1 10*3/uL (ref 0.0–0.1)
Basophils Relative: 1 %
Eosinophils Absolute: 0.2 10*3/uL (ref 0.0–0.5)
Eosinophils Relative: 2 %
HCT: 55.9 % — ABNORMAL HIGH (ref 39.0–52.0)
Hemoglobin: 19 g/dL — ABNORMAL HIGH (ref 13.0–17.0)
Immature Granulocytes: 1 %
Lymphocytes Relative: 28 %
Lymphs Abs: 2.3 10*3/uL (ref 0.7–4.0)
MCH: 29.7 pg (ref 26.0–34.0)
MCHC: 34 g/dL (ref 30.0–36.0)
MCV: 87.5 fL (ref 80.0–100.0)
Monocytes Absolute: 0.6 10*3/uL (ref 0.1–1.0)
Monocytes Relative: 7 %
Neutro Abs: 5.1 10*3/uL (ref 1.7–7.7)
Neutrophils Relative %: 61 %
Platelets: 174 10*3/uL (ref 150–400)
RBC: 6.39 MIL/uL — ABNORMAL HIGH (ref 4.22–5.81)
RDW: 13.2 % (ref 11.5–15.5)
WBC: 8.2 10*3/uL (ref 4.0–10.5)
nRBC: 0 % (ref 0.0–0.2)

## 2023-11-26 LAB — BASIC METABOLIC PANEL
Anion gap: 11 (ref 5–15)
BUN: 15 mg/dL (ref 6–20)
CO2: 25 mmol/L (ref 22–32)
Calcium: 9.2 mg/dL (ref 8.9–10.3)
Chloride: 100 mmol/L (ref 98–111)
Creatinine, Ser: 1.21 mg/dL (ref 0.61–1.24)
GFR, Estimated: >60 mL/min (ref >60)
Glucose, Bld: 218 mg/dL — ABNORMAL HIGH (ref 70–99)
Potassium: 3.7 mmol/L (ref 3.5–5.1)
Sodium: 136 mmol/L (ref 135–145)

## 2023-11-26 NOTE — ED Triage Notes (Signed)
 Pt seen at [facility] today for dizziness. Pt's blood sugar was elevated at 230s. Nurse at urgent told pt to come here to be re-evaluated due to heart hx

## 2023-11-27 MED ORDER — METFORMIN HCL ER (MOD) 1000 MG PO TB24: 1000.0000 mg | ORAL_TABLET | Freq: Two times a day (BID) | ORAL | 1 refills | Status: DC

## 2023-11-27 NOTE — ED Notes (Signed)
Patient verbalizes understanding of discharge instructions. Opportunity for questioning and answers were provided. Armband removed by staff, pt discharged from ED. Ambulated out to lobby  

## 2023-11-27 NOTE — ED Provider Notes (Signed)
 [place] EMERGENCY DEPARTMENT AT [facility] Provider Note   CSN: [id number] Arrival date & time: 11/26/23  1833     History  Chief Complaint  Patient presents with   Dizziness    Zachary Weeks is a 43 y.o. male.  43 year old male was sent to the ER from urgent care secondary to dizziness.  Patient states that he had episode at work on Thursday where he felt really lightheaded and simile headed and sat down and felt better.  States it was not obviously related to movement. Had another episode today so went to [facility] and found to have hyperglycemia and sent here. Has h/o pre diabetes on metformin  but A1c's have been slowly increasing. Has noticed increased thirst and urination recently.    Dizziness      Home Medications Prior to Admission medications   Medication Sig Start Date End Date Taking? Authorizing Provider  albuterol  (VENTOLIN  HFA) 108 (90 Base) MCG/ACT inhaler Inhale 1-2 puffs into the lungs every 6 (six) hours as needed for wheezing or shortness of breath.    [provider]  amLODipine  (NORVASC ) 5 MG tablet Take 1 tablet (5 mg total) by mouth daily. 05/20/23   Patwardhan, Newman PARAS, MD  atorvastatin  (LIPITOR ) 80 MG tablet Take 1 tablet (80 mg total) by mouth daily. 05/20/23   Patwardhan, Newman PARAS, MD  Cholecalciferol  (VITAMIN D3) 1000 units CAPS Take 1,000 Units by mouth in the morning. 06/08/22   [provider]  cyclobenzaprine  (FLEXERIL ) 10 MG tablet Take 10 mg by mouth daily as needed for muscle spasms. 04/26/21   [provider]  metFORMIN  (GLUMETZA ) 1000 MG (MOD) 24 hr tablet Take 1 tablet (1,000 mg total) by mouth 2 (two) times daily. 11/27/23   Sarha Bartelt, Selinda, MD  metoprolol  tartrate (LOPRESSOR ) 50 MG tablet Take 1 tablet (50 mg total) by mouth 2 (two) times daily. 05/20/23   Patwardhan, Newman PARAS, MD  nitroGLYCERIN  (NITROSTAT ) 0.4 MG SL tablet Place 1 tablet (0.4 mg total) under the tongue every 5 (five) minutes as needed for chest pain.  05/07/21   Patwardhan, Newman PARAS, MD  pantoprazole  (PROTONIX ) 40 MG tablet Take 1 tablet (40 mg total) by mouth 2 (two) times daily. 03/24/23 03/23/24  Carver, Charles K, DO      Allergies    Patient has no known allergies.    Review of Systems   Review of Systems  Neurological:  Positive for dizziness.    Physical Exam Updated Vital Signs BP (!) 129/93   Pulse 87   Temp 98.5 F (36.9 C) (Oral)   Resp 17   Ht 6' (1.829 m)   Wt (!) 150.1 kg   SpO2 93%   BMI 44.89 kg/m  Physical Exam Vitals and nursing note reviewed.  Constitutional:      Appearance: He is well-developed.  HENT:     Head: Normocephalic and atraumatic.  Cardiovascular:     Rate and Rhythm: Normal rate.  Pulmonary:     Effort: Pulmonary effort is normal. No respiratory distress.  Abdominal:     General: There is no distension.  Musculoskeletal:        General: Normal range of motion.     Cervical back: Normal range of motion.  Skin:    General: Skin is warm and dry.  Neurological:     Mental Status: He is alert.     ED Results / Procedures / Treatments   Labs (all labs ordered are listed, but only abnormal results  are displayed) Labs Reviewed  CBC WITH DIFFERENTIAL/PLATELET - Abnormal; Notable for the following components:      Result Value   RBC 6.39 (*)    Hemoglobin 19.0 (*)    HCT 55.9 (*)    All other components within normal limits  BASIC METABOLIC PANEL - Abnormal; Notable for the following components:   Glucose, Bld 218 (*)    All other components within normal limits  URINALYSIS, ROUTINE W REFLEX MICROSCOPIC - Abnormal; Notable for the following components:   Glucose, UA >=500 (*)    Protein, ur 30 (*)    All other components within normal limits    EKG None  Radiology DG Chest 2 View Result Date: 11/26/2023 CLINICAL DATA:  Dizziness and elevated blood sugar EXAM: CHEST - 2 VIEW COMPARISON:  12/01/2021 FINDINGS: The heart size and mediastinal contours are within normal limits. Both  lungs are clear. The visualized skeletal structures are unremarkable. IMPRESSION: No active cardiopulmonary disease. Electronically Signed   By: Oneil Devonshire M.D.   On: 11/26/2023 23:53    Procedures Procedures    Medications Ordered in ED Medications - No data to display  ED Course/ Medical Decision Making/ A&P Clinical Course as of 11/27/23 0701  Fri Nov 26, 2023  2303 Glucose(!): 218 [JM]  2303 Hemoglobin(!): 19.0 Concentrated? [JM]  2303 Glucose, UA(!): >=500 [JM]  2303 Protein(!): 30 [JM]    Clinical Course User Index [JM] Lecia Esperanza, Selinda, MD                                 Medical Decision Making Amount and/or Complexity of Data Reviewed Labs: ordered. Decision-making details documented in ED Course. Radiology: ordered. ECG/medicine tests: ordered.  Risk Prescription drug management.   Patient with blood sugars well over 200 here even on his prescribed metformin .  Also with some symptoms of hyperglycemia likely causing dehydration with his hemoconcentration.  Suspect he probably has diabetes this point had a long discussion with him about dietary choices, weight loss and ways to reduce the chance of starting insulin .  Will hold on any additional medications at this time we will just increase his metformin  as he says he to follow-up with his PCP pretty easily.  Will watch his sugar intake for now.  Offered to check his A1c here for his doctor to follow-up he stated he would rather have them do it in the office.  Patient no evidence of DKA, HHS, renal failure or other emergent etiology requiring further workup or hospitalization at this time.  Tolerating p.o. so we will take p.o. fluids.  New prescription for double metformin  sent.  Final Clinical Impression(s) / ED Diagnoses Final diagnoses:  Dizziness  Hyperglycemia    Rx / DC Orders ED Discharge Orders          Ordered    metFORMIN  (GLUMETZA ) 1000 MG (MOD) 24 hr tablet  2 times daily        11/27/23 0037               Kodah Maret, Selinda, MD 11/27/23 [phone]

## 2024-04-24 ENCOUNTER — Encounter (HOSPITAL_COMMUNITY): Payer: Self-pay | Admitting: Emergency Medicine

## 2024-04-24 ENCOUNTER — Other Ambulatory Visit: Payer: Self-pay

## 2024-04-24 ENCOUNTER — Emergency Department (HOSPITAL_COMMUNITY)

## 2024-04-24 ENCOUNTER — Emergency Department (HOSPITAL_COMMUNITY)
Admission: EM | Admit: 2024-04-24 | Discharge: 2024-04-24 | Disposition: A | Discharge status: Home / Self Care | Attending: Emergency Medicine | Admitting: Emergency Medicine

## 2024-04-24 DIAGNOSIS — Z79899 Other long term (current) drug therapy: Secondary | ICD-10-CM | POA: Diagnosis not present

## 2024-04-24 DIAGNOSIS — J45909 Unspecified asthma, uncomplicated: Secondary | ICD-10-CM | POA: Insufficient documentation

## 2024-04-24 DIAGNOSIS — R197 Diarrhea, unspecified: Secondary | ICD-10-CM | POA: Insufficient documentation

## 2024-04-24 DIAGNOSIS — I2510 Atherosclerotic heart disease of native coronary artery without angina pectoris: Secondary | ICD-10-CM | POA: Insufficient documentation

## 2024-04-24 DIAGNOSIS — I10 Essential (primary) hypertension: Secondary | ICD-10-CM | POA: Insufficient documentation

## 2024-04-24 LAB — URINALYSIS, ROUTINE W REFLEX MICROSCOPIC
Bacteria, UA: NONE SEEN
Bilirubin Urine: NEGATIVE
Glucose, UA: 50 mg/dL — AB
Hgb urine dipstick: NEGATIVE
Ketones, ur: NEGATIVE mg/dL
Leukocytes,Ua: NEGATIVE
Nitrite: NEGATIVE
Protein, ur: 30 mg/dL — AB
Specific Gravity, Urine: 1.025 (ref 1.005–1.030)
pH: 5 (ref 5.0–8.0)

## 2024-04-24 LAB — COMPREHENSIVE METABOLIC PANEL WITH GFR
ALT: 27 U/L (ref 0–44)
AST: 22 U/L (ref 15–41)
Albumin: 3.7 g/dL (ref 3.5–5.0)
Alkaline Phosphatase: 64 U/L (ref 38–126)
Anion gap: 10 (ref 5–15)
BUN: 15 mg/dL (ref 6–20)
CO2: 21 mmol/L — ABNORMAL LOW (ref 22–32)
Calcium: 9 mg/dL (ref 8.9–10.3)
Chloride: 107 mmol/L (ref 98–111)
Creatinine, Ser: 1.12 mg/dL (ref 0.61–1.24)
GFR, Estimated: >60 mL/min (ref >60)
Glucose, Bld: 151 mg/dL — ABNORMAL HIGH (ref 70–99)
Potassium: 4.1 mmol/L (ref 3.5–5.1)
Sodium: 138 mmol/L (ref 135–145)
Total Bilirubin: 1.1 mg/dL (ref 0.0–1.2)
Total Protein: 7.4 g/dL (ref 6.5–8.1)

## 2024-04-24 LAB — CBC
HCT: 52.7 % — ABNORMAL HIGH (ref 39.0–52.0)
Hemoglobin: 18.1 g/dL — ABNORMAL HIGH (ref 13.0–17.0)
MCH: 29 pg (ref 26.0–34.0)
MCHC: 34.3 g/dL (ref 30.0–36.0)
MCV: 84.3 fL (ref 80.0–100.0)
Platelets: 186 10*3/uL (ref 150–400)
RBC: 6.25 MIL/uL — ABNORMAL HIGH (ref 4.22–5.81)
RDW: 13.3 % (ref 11.5–15.5)
WBC: 8.2 10*3/uL (ref 4.0–10.5)
nRBC: 0 % (ref 0.0–0.2)

## 2024-04-24 LAB — LIPASE, BLOOD: Lipase: 37 U/L (ref 11–51)

## 2024-04-24 LAB — C DIFFICILE QUICK SCREEN W PCR REFLEX
C Diff antigen: NEGATIVE
C Diff interpretation: NOT DETECTED
C Diff toxin: NEGATIVE

## 2024-04-24 LAB — MAGNESIUM: Magnesium: 1.8 mg/dL (ref 1.7–2.4)

## 2024-04-24 MED ORDER — LACTATED RINGERS IV BOLUS
1000.0000 mL | Freq: Once | INTRAVENOUS | Status: AC
  Administered 2024-04-24: 999 mL via INTRAVENOUS

## 2024-04-24 MED ORDER — IOHEXOL 300 MG/ML  SOLN
100.0000 mL | Freq: Once | INTRAMUSCULAR | Status: AC | PRN
  Administered 2024-04-24: 100 mL via INTRAVENOUS

## 2024-04-24 NOTE — ED Triage Notes (Signed)
 Pt reports abd pain with diarrhea x 6 days, denies fever/n/v

## 2024-04-24 NOTE — Discharge Instructions (Signed)
 Your test results today were reassuring.  Results of GI pathogen panel should be available within the next 1 to 2 days.  Follow-up on MyChart or with your PCP for results.  In the meantime, drink plenty of fluids at home to stay hydrated.  Return to the emergency department for any new or worsening symptoms of concern.

## 2024-04-24 NOTE — ED Notes (Signed)
 Pt reports normal O2 range 90-91%

## 2024-04-24 NOTE — ED Provider Notes (Signed)
 [place] EMERGENCY DEPARTMENT AT [facility] Provider Note   CSN: [id number] Arrival date & time: 04/24/24  4782     History  Chief Complaint  Patient presents with   Abdominal Pain    Zachary Weeks is a 43 y.o. male.   Abdominal Pain Associated symptoms: diarrhea and nausea   Patient presents for normal pain.  Medical history includes HTN, prediabetes, sarcoidosis, CAD, DVT, asthma, GERD, HLD, OSA.  6 days ago, he had onset of diarrhea.  Initially he estimates that he had 30 episodes a day.  Although frequency has decreased, he continues to have multiple episodes of diarrhea a day.  He had 3 episodes this morning.  He has had intermittent generalized abdominal cramping.  This seems to worsen after he has a bowel movement.  He has not had any vomiting.  He will have intermittent nausea and describes foul tasting burps.  He currently denies any symptoms.     Home Medications Prior to Admission medications   Medication Sig Start Date End Date Taking? Authorizing Provider  albuterol  (VENTOLIN  HFA) 108 (90 Base) MCG/ACT inhaler Inhale 1-2 puffs into the lungs every 6 (six) hours as needed for wheezing or shortness of breath.    [provider]  amLODipine  (NORVASC ) 5 MG tablet Take 1 tablet (5 mg total) by mouth daily. 05/20/23   Patwardhan, Kaye Parsons, MD  atorvastatin  (LIPITOR ) 80 MG tablet Take 1 tablet (80 mg total) by mouth daily. 05/20/23   Patwardhan, Kaye Parsons, MD  Cholecalciferol  (VITAMIN D3) 1000 units CAPS Take 1,000 Units by mouth in the morning. 06/08/22   [provider]  cyclobenzaprine  (FLEXERIL ) 10 MG tablet Take 10 mg by mouth daily as needed for muscle spasms. 04/26/21   [provider]  metFORMIN  (GLUMETZA ) 1000 MG (MOD) 24 hr tablet Take 1 tablet (1,000 mg total) by mouth 2 (two) times daily. 11/27/23   Mesner, Reymundo Caulk, MD  metoprolol  tartrate (LOPRESSOR ) 50 MG tablet Take 1 tablet (50 mg total) by mouth 2 (two) times daily. 05/20/23    Patwardhan, Kaye Parsons, MD  nitroGLYCERIN  (NITROSTAT ) 0.4 MG SL tablet Place 1 tablet (0.4 mg total) under the tongue every 5 (five) minutes as needed for chest pain. 05/07/21   Patwardhan, Kaye Parsons, MD  pantoprazole  (PROTONIX ) 40 MG tablet Take 1 tablet (40 mg total) by mouth 2 (two) times daily. 03/24/23 03/23/24  Carver, Charles K, DO      Allergies    Patient has no known allergies.    Review of Systems   Review of Systems  Gastrointestinal:  Positive for abdominal pain, diarrhea and nausea.  All other systems reviewed and are negative.   Physical Exam Updated Vital Signs BP 124/86   Pulse 73   Temp (!) 97.5 F (36.4 C) (Temporal)   Resp 16   Ht 5\' 11"  (1.803 m)   Wt (!) 142.4 kg   SpO2 91%   BMI 43.79 kg/m  Physical Exam Vitals and nursing note reviewed.  Constitutional:      General: He is not in acute distress.    Appearance: He is well-developed. He is not ill-appearing, toxic-appearing or diaphoretic.  HENT:     Head: Normocephalic and atraumatic.     Mouth/Throat:     Mouth: Mucous membranes are moist.  Eyes:     Conjunctiva/sclera: Conjunctivae normal.  Cardiovascular:     Rate and Rhythm: Normal rate and regular rhythm.  Pulmonary:     Effort: Pulmonary effort is normal.  No respiratory distress.     Breath sounds: Normal breath sounds. No wheezing, rhonchi or rales.  Abdominal:     Palpations: Abdomen is soft.     Tenderness: There is no abdominal tenderness.  Musculoskeletal:        General: No swelling.     Cervical back: Neck supple.  Skin:    General: Skin is warm and dry.     Coloration: Skin is not cyanotic, jaundiced or pale.  Neurological:     General: No focal deficit present.     Mental Status: He is alert and oriented to person, place, and time.  Psychiatric:        Mood and Affect: Mood normal.        Behavior: Behavior normal.     ED Results / Procedures / Treatments   Labs (all labs ordered are listed, but only abnormal results are  displayed) Labs Reviewed  COMPREHENSIVE METABOLIC PANEL WITH GFR - Abnormal; Notable for the following components:      Result Value   CO2 21 (*)    Glucose, Bld 151 (*)    All other components within normal limits  CBC - Abnormal; Notable for the following components:   RBC 6.25 (*)    Hemoglobin 18.1 (*)    HCT 52.7 (*)    All other components within normal limits  URINALYSIS, ROUTINE W REFLEX MICROSCOPIC - Abnormal; Notable for the following components:   Color, Urine AMBER (*)    Glucose, UA 50 (*)    Protein, ur 30 (*)    All other components within normal limits  C DIFFICILE QUICK SCREEN W PCR REFLEX    GASTROINTESTINAL PANEL BY PCR, STOOL (REPLACES STOOL CULTURE)  LIPASE, BLOOD  MAGNESIUM    EKG None  Radiology CT ABDOMEN PELVIS W CONTRAST Result Date: 04/24/2024 CLINICAL DATA:  Abdominal pain.  Nonlocalized.  Diarrhea for 6 days EXAM: CT ABDOMEN AND PELVIS WITH CONTRAST TECHNIQUE: Multidetector CT imaging of the abdomen and pelvis was performed using the standard protocol following bolus administration of intravenous contrast. RADIATION DOSE REDUCTION: This exam was performed according to the departmental dose-optimization program which includes automated exposure control, adjustment of the mA and/or kV according to patient size and/or use of iterative reconstruction technique. CONTRAST:  OMNIPAQUE  IOHEXOL  300 MG/ML  SOLN COMPARISON:  None Available. FINDINGS: Lower chest: Lung bases are clear. Hepatobiliary: No focal hepatic lesion. Normal gallbladder. No biliary duct dilatation. Common bile duct is normal. Pancreas: Pancreas is normal. No ductal dilatation. No pancreatic inflammation. Spleen: Normal spleen Adrenals/urinary tract: Adrenal glands and kidneys are normal. Tiny nonobstructing calculus in the LEFT kidney. No ureterolithiasis or obstructive uropathy. The ureters and bladder normal. Stomach/Bowel: The stomach, duodenum, and small bowel normal. The colon and  rectosigmoid colon are normal. Vascular/Lymphatic: Abdominal aorta is normal caliber. No periportal or retroperitoneal adenopathy. No pelvic adenopathy. Reproductive: Prostate unremarkable Other: No free fluid. Musculoskeletal: No aggressive osseous lesion. IMPRESSION: 1. No acute findings in the abdomen pelvis. 2. Normal bowel. Electronically Signed   By: Deboraha Fallow M.D.   On: 04/24/2024 12:40    Procedures Procedures    Medications Ordered in ED Medications  lactated ringers  bolus 1,000 mL (0 mLs Intravenous Stopped 04/24/24 1253)  iohexol  (OMNIPAQUE ) 300 MG/ML solution 100 mL (100 mLs Intravenous Contrast Given 04/24/24 1118)    ED Course/ Medical Decision Making/ A&P  Medical Decision Making Amount and/or Complexity of Data Reviewed Labs: ordered. Radiology: ordered.  Risk Prescription drug management.   This patient presents to the ED for concern of abdominal discomfort and diarrhea, this involves an extensive number of treatment options, and is a complaint that carries with it a high risk of complications and morbidity.  The differential diagnosis includes colitis, gastroenteritis, dehydration, metabolic derangements   Co morbidities / Chronic conditions that complicate the patient evaluation  HTN, prediabetes, sarcoidosis, CAD, DVT, asthma, GERD, HLD, OSA   Additional history obtained:  Additional history obtained from EMR External records from outside source obtained and reviewed including N/A   Lab Tests:  I Ordered, and personally interpreted labs.  The pertinent results include: Normal kidney function, normal electrolytes, no leukocytosis.  Erythrocytosis consistent with prior lab work.  C. difficile testing was negative.  GI pathogen panel pending at time of discharge.   Imaging Studies ordered:  I ordered imaging studies including CT of abdomen and pelvis I independently visualized and interpreted imaging which showed no  acute findings I agree with the radiologist interpretation   Cardiac Monitoring: / EKG:  The patient was maintained on a cardiac monitor.  I personally viewed and interpreted the cardiac monitored which showed an underlying rhythm of: [place] rhythm   Problem List / ED Course / Critical interventions / Medication management  Patient presenting for 6 days of persistent diarrhea and intermittent generalized abdominal cramping pain.  On arrival in the ED, SpO2 was in the low 90s on room air.  He states that this is baseline for him.  He has seen pulmonology but has never been given a diagnosis of why he has chronically low-normal oxygen saturation.  He states that his mother did have pulmonary fibrosis and he is worried about this.  He currently denies any recent respiratory symptoms.  He is here today for GI symptoms.  His abdomen is currently soft and nontender.  He is currently asymptomatic.  Given his recent fluid losses, IV fluids were ordered.  Laboratory workup was initiated.  Patient's lab work was reassuring.  He has no leukocytosis, electrolytes are normal.  C. difficile testing was negative.  His CT scan of abdomen and pelvis did not show any acute findings.  On reassessment, patient resting comfortably.  He was advised to continue p.o. hydration at home to replace fluid losses and to follow-up on results of GI pathogen panel.  He was discharged in stable condition. I ordered medication including IV fluids for hydration Reevaluation of the patient after these medicines showed that the patient improved I have reviewed the patients home medicines and have made adjustments as needed   Social Determinants of Health:  Lives independently        Final Clinical Impression(s) / ED Diagnoses Final diagnoses:  Diarrhea, unspecified type    Rx / DC Orders ED Discharge Orders     None         Iva Mariner, MD 04/24/24 1409

## 2024-04-25 LAB — GASTROINTESTINAL PANEL BY PCR, STOOL (REPLACES STOOL CULTURE)

## 2024-05-09 ENCOUNTER — Ambulatory Visit: Admitting: Gastroenterology

## 2024-05-10 ENCOUNTER — Ambulatory Visit (INDEPENDENT_AMBULATORY_CARE_PROVIDER_SITE_OTHER): Admitting: Gastroenterology

## 2024-05-10 ENCOUNTER — Encounter: Payer: Self-pay | Admitting: Gastroenterology

## 2024-05-10 VITALS — BP 146/106 | HR 97 | Temp 97.5°F | Ht 71.0 in | Wt 312.4 lb

## 2024-05-10 DIAGNOSIS — R194 Change in bowel habit: Secondary | ICD-10-CM | POA: Diagnosis not present

## 2024-05-10 DIAGNOSIS — K219 Gastro-esophageal reflux disease without esophagitis: Secondary | ICD-10-CM | POA: Diagnosis not present

## 2024-05-10 MED ORDER — DICYCLOMINE HCL 10 MG PO CAPS: 10.0000 mg | ORAL_CAPSULE | Freq: Three times a day (TID) | ORAL | 3 refills | Status: DC

## 2024-05-10 NOTE — Patient Instructions (Addendum)
 Please have blood work done at [facility]. I will be in touch with the results!  I have sent in dicyclomine to take up to 4 times a day before meals and at bedtime to help with cramping and frequent stool. Side effects can include dry mouth, dizziness, constipation. Monitor for this.  You can take a probiotic over the counter such as Align, Restora, digestive advantage.  We will see you in 3 months regardless; however, we will be in touch with next steps after labs!  It was a pleasure to see you today. I want to create trusting relationships with patients and provide genuine, compassionate, and quality care. I truly value your feedback, so please be on the lookout for a survey regarding your visit with me today. I appreciate your time in completing this!         Delman Ferns, PhD, ANP-BC [facility]

## 2024-05-10 NOTE — Progress Notes (Signed)
 Gastroenterology Office Note     Primary Care Physician:  Leesa Pulling, MD  Primary Gastroenterologist: Dr. Mordechai April   Chief Complaint   Chief Complaint  Patient presents with   New Patient (Initial Visit)    Patient here today as a new patient due very loose stools, he is having issues with gas and rumbling, noisy stomach sounds. He says his belching is sulfuric in nature and tastes like eggs. On Pantoprazole  40 mg bid.      History of Present Illness   Zachary Weeks is a 43 y.o. male presenting today with a history of chronic GERD, H.pylori gastritis s/p documented eradication, numerous adenomatous and sessile serrated polyps in 2023 with surveillance due in 2026. He is here today due to changes in bowel habits for the past month. He saw Dr. Bearl Limes in clinic yesterday and also had labs done last week, which we will request.  ED visit from 6/2 also reviewed. Cdiff negative. GI path negative. Hgb 18.1, near baseline. Creatinine 1.12, potassium 4.1. Lipase normal at 37.   Today:  Symptom onset a month ago. No solid BM for a month or so. Stomach will be gurgling like gas. When belches tastes like eggs.   Stool has been consistency of runny pudding to water. On a normal day may have 1-3, the bad days will go up to 10 times a day or more. Some mild abdominal cramping intermittently. No OTC agents tried thus far. Pepto makes him nauseated.   No recent abx. No sick contacts. Thought initially had the stomach bug. No rectal bleeding. Appetite is good. No N/V.   Quit taking metformin  and jardiance about 3 weeks ago. Had already been on for awhile prior to stopping this. PCP aware. No improvement in symptoms.   Pantoprazole  BID: controls GERD symptoms. Sometimes an episode but is due to dietary triggers.   Baseline BMs before onset a month ago: usually 1-2 Bms formed per day.   Eating yogurt. Limited dairy.   EGD 10/08/2022 with gastritis, biopsies positive for H. pylori.   Has completed course of quad therapy with tetracycline /metronidazole .  Currently taking pantoprazole  twice daily. Eradication testing with H pylori breath test negative 12/15/22.   Colonoscopy 10/08/2022 with multiple polyps removed.  1 large polyp at hepatic flexure sessile serrated polyp.  Required clips.  Large pedunculated descending polyp tubular adenoma.  1 smaller rectal tubular adenoma.  Recommended recall 3 years.   No FH IBD, no FH celiac disease.    Past Medical History:  Diagnosis Date   Asthma    DVT (deep venous thrombosis) (HCC)    Dyspnea    at times - no known reason.    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   Myocardial infarction [facility])    Pneumonia    50- 40 years of age   Pre-diabetes    Sarcoidosis    Sleep apnea    CPAP @ HS    Past Surgical History:  Procedure Laterality Date   BIOPSY  10/08/2022   Procedure: BIOPSY;  Surgeon: Vinetta Greening, DO;  Location: [facility]O [facility];  Service: Endoscopy;;   BRONCHIAL NEEDLE ASPIRATION BIOPSY  11/01/2020   Procedure: BRONCHIAL NEEDLE ASPIRATION BIOPSIES;  Surgeon: Lind Repine, MD;  Location[facility] ENDOSCOPY;  Service: Cardiopulmonary;;   BRONCHIAL WASHINGS  11/01/2020   Procedure: BRONCHIAL WASHINGS;  Surgeon:  Lind Repine, MD;  Location: [facility] ENDOSCOPY;  Service: Cardiopulmonary;;   CARDIAC CATHETERIZATION     COLONOSCOPY WITH PROPOFOL  N/A 10/08/2022   Procedure: COLONOSCOPY WITH PROPOFOL ;  Surgeon: Vinetta Greening, DO;  Location: [facility] ENDO [facility];  Service: Endoscopy;  Laterality: N/A;  11:30 AM   CORONARY ULTRASOUND/IVUS N/A 05/05/2021   Procedure: Intravascular Ultrasound/IVUS;  Surgeon: Cody Das, MD;  Location: [facility] INVASIVE CV LAB;  Service: Cardiovascular;  Laterality: N/A;   ESOPHAGOGASTRODUODENOSCOPY (EGD) WITH PROPOFOL  N/A 10/08/2022   Procedure: ESOPHAGOGASTRODUODENOSCOPY (EGD) WITH PROPOFOL ;  Surgeon:  Vinetta Greening, DO;  Location: [facility];  Service: Endoscopy;  Laterality: N/A;   HEMOSTASIS CLIP PLACEMENT  10/08/2022   Procedure: HEMOSTASIS CLIP PLACEMENT;  Surgeon: Vinetta Greening, DO;  Location: [facility];  Service: Endoscopy;;   LEFT HEART CATH AND CORONARY ANGIOGRAPHY N/A 05/05/2021   Procedure: LEFT HEART CATH AND CORONARY ANGIOGRAPHY;  Surgeon: Cody Das, MD;  Location: [facility] INVASIVE CV LAB;  Service: Cardiovascular;  Laterality: N/A;   MEDIASTINOSCOPY N/A 07/11/2021   Procedure: MEDIASTINOSCOPY;  Surgeon: Zelphia Higashi, MD;  Location: [facility] OR;  Service: Thoracic;  Laterality: N/A;   POLYPECTOMY  10/08/2022   Procedure: POLYPECTOMY;  Surgeon: Vinetta Greening, DO;  Location: [facility];  Service: Endoscopy;;   SUBMUCOSAL LIFTING INJECTION  10/08/2022   Procedure: SUBMUCOSAL LIFTING INJECTION;  Surgeon: Vinetta Greening, DO;  Location: [facility];  Service: Endoscopy;;   SUBMUCOSAL TATTOO INJECTION  10/08/2022   Procedure: SUBMUCOSAL TATTOO INJECTION;  Surgeon: Vinetta Greening, DO;  Location: [facility];  Service: Endoscopy;;   VIDEO BRONCHOSCOPY N/A 11/01/2020   Procedure: VIDEO BRONCHOSCOPY WITH ENDOBRONCHIAL ULTRASOUND;  Surgeon: Lind Repine, MD;  Location: [facility] ENDOSCOPY;  Service: Cardiopulmonary;  Laterality: N/A;    Current Outpatient Medications  Medication Sig Dispense Refill   albuterol  (VENTOLIN  HFA) 108 (90 Base) MCG/ACT inhaler Inhale 1-2 puffs into the lungs every 6 (six) hours as needed for wheezing or shortness of breath.     amLODipine  (NORVASC ) 5 MG tablet Take 1 tablet (5 mg total) by mouth daily. 90 tablet 3   atorvastatin  (LIPITOR ) 80 MG tablet Take 1 tablet (80 mg total) by mouth daily. 90 tablet 3   Cholecalciferol  (VITAMIN D3) 1000 units CAPS Take 1,000 Units by mouth in the morning.     cyclobenzaprine  (FLEXERIL ) 10 MG tablet Take 10 mg by mouth daily as needed for muscle spasms.     dicyclomine (BENTYL) 10 MG  capsule Take 1 capsule (10 mg total) by mouth 4 (four) times daily -  before meals and at bedtime. For loose stool and abdominal cramping 120 capsule 3   metoprolol  tartrate (LOPRESSOR ) 50 MG tablet Take 1 tablet (50 mg total) by mouth 2 (two) times daily. 180 tablet 3   nitroGLYCERIN  (NITROSTAT ) 0.4 MG SL tablet Place 1 tablet (0.4 mg total) under the tongue every 5 (five) minutes as needed for chest pain. 30 tablet 1   pantoprazole  (PROTONIX ) 40 MG tablet Take 1 tablet (40 mg total) by mouth 2 (two) times daily. 60 tablet 11   JARDIANCE 25 MG TABS tablet Take 25 mg by mouth daily. (Patient not taking: Reported on 05/10/2024)     metFORMIN  (GLUMETZA ) 1000 MG (MOD) 24 hr tablet Take 1 tablet (1,000 mg total) by mouth 2 (two) times daily. (Patient not taking: Reported on 05/10/2024) 60 tablet 1   No current facility-administered medications for this visit.    Allergies  as of 05/10/2024   (No Known Allergies)    Family History  Problem Relation Age of Onset   Pulmonary fibrosis Mother    Cancer Mother        cervical   Cancer Father        prostate   Hypertension Father    Lupus Maternal Grandmother    Other Maternal Grandmother        clotting disorder   Fibromyalgia Maternal Aunt    Other Other        clotting disorder   Colon cancer Neg Hx     Social History   Socioeconomic History   Marital status: Legally Separated    Spouse name: Not on file   Number of children: 2   Years of education: Not on file   Highest education level: High school graduate  Occupational History   Not on file  Tobacco Use   Smoking status: Former    Current packs/day: 0.00    Average packs/day: 1 pack/day for 3.0 years (3.0 ttl pk-yrs)    Types: Cigarettes    Start date: 6    Quit date: 1999    Years since quitting: 26.4    Passive exposure: Current   Smokeless tobacco: Current    Types: Chew, Snuff  Vaping Use   Vaping status: Never Used  Substance and Sexual Activity   Alcohol use:  Yes    Comment: rarely- maybe 2 mixed drinks in a year   Drug use: Never   Sexual activity: Not on file  Other Topics Concern   Not on file  Social History Narrative   Lives with family   Caffeine- occass soda   Social Drivers of Health   Financial Resource Strain: Low Risk  (02/21/2020)   Received from [facility]   Overall Financial Resource Strain (CARDIA)    Difficulty of Paying Living Expenses: Not hard at all  Food Insecurity: No Food Insecurity (02/21/2020)   [facility]   Hunger Vital Sign    Within the past 12 months, you worried that your food would run out before you got the money to buy more.: Never true    Within the past 12 months, the food you bought just didn't last and you didn't have money to get more.: Never true  Transportation Needs: No Transportation Needs (02/21/2020)   Received from [facility]   PRAPARE - Transportation    Lack of Transportation (Medical): No    Lack of Transportation (Non-Medical): No  Physical Activity: Inactive (02/21/2020)   Received from [facility]   Exercise Vital Sign    On average, how many days per week do you engage in moderate to strenuous exercise (like a brisk walk)?: 0 days    On average, how many minutes do you engage in exercise at this level?: 0 min  Stress: No Stress Concern Present (02/21/2020)   Received from [facility] of Occupational [facility] - Occupational Stress Questionnaire    Feeling of Stress : Not at all  Social Connections: Unknown (03/25/2022)   Received from [facility]   Social Network    Social Network: Not on file  Intimate Partner Violence: Unknown (02/27/2022)   Received from [facility]   HITS    Physically Hurt: Not on file    Insult or Talk Down To: Not on file    Threaten Physical Harm: Not on file    Scream or Curse:  Not on file     Review of Systems   Gen: Denies any fever, chills, fatigue, weight loss, lack of appetite.  CV: Denies  chest pain, heart palpitations, peripheral edema, syncope.  Resp: Denies shortness of breath at rest or with exertion. Denies wheezing or cough.  GI: Denies dysphagia or odynophagia. Denies jaundice, hematemesis, fecal incontinence. GU : Denies urinary burning, urinary frequency, urinary hesitancy MS: Denies joint pain, muscle weakness, cramps, or limitation of movement.  Derm: Denies rash, itching, dry skin Psych: Denies depression, anxiety, memory loss, and confusion Heme: Denies bruising, bleeding, and enlarged lymph nodes.   Physical Exam   BP (!) 170/111 (BP Location: Right Arm, Patient Position: Sitting, Cuff Size: Large)   Pulse (!) 108   Temp (!) 97.5 F (36.4 C) (Temporal)   Ht 5' 11 (1.803 m)   Wt (!) 312 lb 6.4 oz (141.7 kg)   BMI 43.57 kg/m  General:   Alert and oriented. Pleasant and cooperative. Well-nourished and well-developed.  Head:  Normocephalic and atraumatic. Eyes:  Without icterus Abdomen:  +BS, soft, non-tender and non-distended. No HSM noted. No guarding or rebound. No masses appreciated.  Rectal:  Deferred  Msk:  Symmetrical without gross deformities. Normal posture. Extremities:  Without edema. Neurologic:  Alert and  oriented x4;  grossly normal neurologically. Skin:  Intact without significant lesions or rashes. Psych:  Alert and cooperative. Normal mood and affect.   Assessment   DAREY HERSHBERGER is a pleasant  43 y.o. male presenting today with a history of chronic GERD, H.pylori gastritis s/p documented eradication, numerous adenomatous and sessile serrated polyps in 2023 with surveillance due in 2026, now with change in bowel habits for the past month.   Frequent stools: onset a month ago, alternating between 3 loose/runny stools to 10 per day at its worst, no overt GI bleeding, no sick contacts or abx exposure, no improvement with stopping metformin  and jardiance.CT on 6/2 with contrast unrevealing. Suspect he had an acute gastroenteritis and now  dealing moreso with post-infectious IBS symptoms. Doubt dealing with acute presentation of IBD, celiac, alpha gal, etc. However, will order labs and provide supportive measures in interim. May ultimately need a course of Xifaxan if no improvement with plan. No need for updating colonoscopy at this time but will be due next year. If he has new onset rectal bleeding or no improvement, will pursue diagnostic colonoscopy.   GERD controlled on pantoprazole  BID.     PLAN    Obtain outside labs from Dr. Bearl Limes CRP, sed rate, celiac panel, alpha gal (patient declined fecal cal) Dicyclomine up to QID for supportive measures Consider Xifaxan if no improvement Holding on colonoscopy unless rectal bleeding or alarm signs/symptoms Colonoscopy due in 206 Return in 3 months regardless   Zachary Ferns, PhD, ANP-BC [facility]

## 2024-05-14 LAB — SEDIMENTATION RATE: Sed Rate: 5 mm/h (ref 0–15)

## 2024-05-14 LAB — ALPHA-GAL PANEL
Allergen Lamb IgE: 0.19 kU/L — AB
Beef IgE: 0.41 kU/L — AB
IgE (Immunoglobulin E), Serum: 54 [IU]/mL (ref 6–495)
O215-IgE Alpha-Gal: 0.95 kU/L — AB
Pork IgE: 0.15 kU/L — AB

## 2024-05-14 LAB — CELIAC DISEASE PANEL
Endomysial IgA: NEGATIVE
IgA/Immunoglobulin A, Serum: 197 mg/dL (ref 90–386)

## 2024-05-14 LAB — C-REACTIVE PROTEIN: CRP: 2 mg/L (ref 0–10)

## 2024-05-15 ENCOUNTER — Ambulatory Visit: Payer: Self-pay | Admitting: Gastroenterology

## 2024-05-15 MED FILL — Lactated Ringer's Solution: INTRAVENOUS | Qty: 1000 | Status: AC

## 2024-05-15 NOTE — Telephone Encounter (Signed)
Addressed under result note

## 2024-05-29 ENCOUNTER — Ambulatory Visit: Admitting: Gastroenterology

## 2024-06-03 ENCOUNTER — Other Ambulatory Visit: Payer: Self-pay | Admitting: Internal Medicine

## 2024-06-03 ENCOUNTER — Other Ambulatory Visit: Payer: Self-pay | Admitting: Cardiology

## 2024-06-09 ENCOUNTER — Emergency Department (HOSPITAL_COMMUNITY)
Admission: EM | Admit: 2024-06-09 | Discharge: 2024-06-09 | Disposition: A | Discharge status: Home / Self Care | Attending: Emergency Medicine | Admitting: Emergency Medicine

## 2024-06-09 ENCOUNTER — Other Ambulatory Visit: Payer: Self-pay

## 2024-06-09 ENCOUNTER — Emergency Department (HOSPITAL_COMMUNITY)

## 2024-06-09 DIAGNOSIS — I129 Hypertensive chronic kidney disease with stage 1 through stage 4 chronic kidney disease, or unspecified chronic kidney disease: Secondary | ICD-10-CM | POA: Diagnosis not present

## 2024-06-09 DIAGNOSIS — J45909 Unspecified asthma, uncomplicated: Secondary | ICD-10-CM | POA: Diagnosis not present

## 2024-06-09 DIAGNOSIS — Z79899 Other long term (current) drug therapy: Secondary | ICD-10-CM | POA: Insufficient documentation

## 2024-06-09 DIAGNOSIS — R0602 Shortness of breath: Secondary | ICD-10-CM | POA: Diagnosis present

## 2024-06-09 DIAGNOSIS — R42 Dizziness and giddiness: Secondary | ICD-10-CM | POA: Diagnosis not present

## 2024-06-09 DIAGNOSIS — N189 Chronic kidney disease, unspecified: Secondary | ICD-10-CM | POA: Diagnosis not present

## 2024-06-09 LAB — CBC
HCT: 51.7 % (ref 39.0–52.0)
Hemoglobin: 17.3 g/dL — ABNORMAL HIGH (ref 13.0–17.0)
MCH: 28.3 pg (ref 26.0–34.0)
MCHC: 33.5 g/dL (ref 30.0–36.0)
MCV: 84.6 fL (ref 80.0–100.0)
Platelets: 200 K/uL (ref 150–400)
RBC: 6.11 MIL/uL — ABNORMAL HIGH (ref 4.22–5.81)
RDW: 13.1 % (ref 11.5–15.5)
WBC: 8.3 K/uL (ref 4.0–10.5)
nRBC: 0 % (ref 0.0–0.2)

## 2024-06-09 LAB — BASIC METABOLIC PANEL WITH GFR
Anion gap: 15 (ref 5–15)
BUN: 16 mg/dL (ref 6–20)
CO2: 23 mmol/L (ref 22–32)
Calcium: 9.2 mg/dL (ref 8.9–10.3)
Chloride: 99 mmol/L (ref 98–111)
Creatinine, Ser: 1.18 mg/dL (ref 0.61–1.24)
GFR, Estimated: >60 mL/min (ref >60)
Glucose, Bld: 183 mg/dL — ABNORMAL HIGH (ref 70–99)
Potassium: 3.6 mmol/L (ref 3.5–5.1)
Sodium: 137 mmol/L (ref 135–145)

## 2024-06-09 LAB — TROPONIN I (HIGH SENSITIVITY)
Troponin I (High Sensitivity): 7 ng/L (ref <18)
Troponin I (High Sensitivity): 7 ng/L (ref <18)

## 2024-06-09 LAB — D-DIMER, QUANTITATIVE: D-Dimer, Quant: <0.27 ug{FEU}/mL (ref 0.00–0.50)

## 2024-06-09 MED ORDER — ALBUTEROL SULFATE (2.5 MG/3ML) 0.083% IN NEBU
2.5000 mg | INHALATION_SOLUTION | Freq: Once | RESPIRATORY_TRACT | Status: AC
  Administered 2024-06-09: 2.5 mg via RESPIRATORY_TRACT
  Filled 2024-06-09: qty 3

## 2024-06-09 MED ORDER — IPRATROPIUM-ALBUTEROL 0.5-2.5 (3) MG/3ML IN SOLN
3.0000 mL | Freq: Once | RESPIRATORY_TRACT | Status: AC
  Administered 2024-06-09: 3 mL via RESPIRATORY_TRACT
  Filled 2024-06-09: qty 3

## 2024-06-09 MED ORDER — METHYLPREDNISOLONE SODIUM SUCC 125 MG IJ SOLR
125.0000 mg | Freq: Once | INTRAMUSCULAR | Status: AC
  Administered 2024-06-09: 125 mg via INTRAVENOUS
  Filled 2024-06-09: qty 2

## 2024-06-09 NOTE — ED Notes (Signed)
 Patient ambulated while on room air. Pulse oximetry was monitored with an initial reading of 89%. SpO2 did not drop during ambulation. It maintained at 89%. Patient denied complaints of dizziness or shortness of breath. No increased work of breathing was noted.

## 2024-06-09 NOTE — Discharge Instructions (Addendum)
 2 puffs of your albuterol  inhaler every 4 hours as needed.  As discussed, wear your CPAP at night.  Use your portable pulse oximeter to monitor your oxygen levels at home.  Please return to the emergency department if you develop worsening symptoms.  Also, call your pulmonologist to arrange follow-up appointment.

## 2024-06-09 NOTE — ED Provider Notes (Signed)
 [place] EMERGENCY DEPARTMENT AT [facility] Provider Note   CSN: [id number] Arrival date & time: 06/09/24  9077     Patient presents with: Shortness of Breath and Dizziness   Zachary Weeks is a 43 y.o. male.    Shortness of Breath Associated symptoms: no abdominal pain, no chest pain, no cough, no diaphoresis, no fever, no vomiting and no wheezing   Dizziness Associated symptoms: shortness of breath   Associated symptoms: no chest pain, no nausea, no vomiting and no weakness        Zachary Weeks is a 43 y.o. male with past history of sarcoidosis, obstructive sleep apnea, asthma CKD, hypertension, prior NSTEMI and GERD who presents to the Emergency Department complaining of shortness of breath upon waking this morning.  States he used his albuterol  inhaler without relief.  He describes feeling as though I am breathing through a wet towel.  He described feeling a heaviness under both arms.  No pain radiating down his arms into his neck or jaw.  He states he had a similar episode in 2022 in which he had was told that he had a heart attack, but states catheterization did not show evidence of this.  He denies any diaphoresis nausea or vomiting.  Symptoms improved after ER arrival and was placed on 2 L O2 by nasal cannula.  Patient does admit to having history of sleep apnea and is supposed to wear CPAP, but does not routinely wear it. Does not take any medications currently for his DM.    Prior to Admission medications   Medication Sig Start Date End Date Taking? Authorizing Provider  albuterol  (VENTOLIN  HFA) 108 (90 Base) MCG/ACT inhaler Inhale 1-2 puffs into the lungs every 6 (six) hours as needed for wheezing or shortness of breath.    [provider]  amLODipine  (NORVASC ) 5 MG tablet TAKE 1 TABLET BY MOUTH DAILY 06/05/24   Patwardhan, Newman PARAS, MD  atorvastatin  (LIPITOR ) 80 MG tablet Take 1 tablet (80 mg total) by mouth daily. 05/20/23   Patwardhan, Newman PARAS, MD   Cholecalciferol  (VITAMIN D3) 1000 units CAPS Take 1,000 Units by mouth in the morning. 06/08/22   [provider]  cyclobenzaprine  (FLEXERIL ) 10 MG tablet Take 10 mg by mouth daily as needed for muscle spasms. 04/26/21   [provider]  dicyclomine  (BENTYL ) 10 MG capsule Take 1 capsule (10 mg total) by mouth 4 (four) times daily -  before meals and at bedtime. For loose stool and abdominal cramping 05/10/24   Shirlean Therisa ORN, NP  JARDIANCE 25 MG TABS tablet Take 25 mg by mouth daily. Patient not taking: Reported on 05/10/2024 02/29/24   [provider]  metFORMIN  (GLUMETZA ) 1000 MG (MOD) 24 hr tablet Take 1 tablet (1,000 mg total) by mouth 2 (two) times daily. Patient not taking: Reported on 05/10/2024 11/27/23   Mesner, Selinda, MD  metoprolol  tartrate (LOPRESSOR ) 50 MG tablet TAKE 1 TABLET BY MOUTH TWICE DAILY 06/05/24   Patwardhan, Newman PARAS, MD  nitroGLYCERIN  (NITROSTAT ) 0.4 MG SL tablet Place 1 tablet (0.4 mg total) under the tongue every 5 (five) minutes as needed for chest pain. 05/07/21   Patwardhan, Newman PARAS, MD  pantoprazole  (PROTONIX ) 40 MG tablet TAKE 1 TABLET BY MOUTH TWICE DAILY 06/05/24   Shirlean Therisa ORN, NP    Allergies: Patient has no known allergies.    Review of Systems  Constitutional:  Negative for appetite change, chills, diaphoresis and fever.  Respiratory:  Positive for  shortness of breath. Negative for cough and wheezing.   Cardiovascular:  Negative for chest pain and leg swelling.  Gastrointestinal:  Negative for abdominal pain, nausea and vomiting.  Genitourinary:  Negative for dysuria.  Musculoskeletal:  Negative for arthralgias and myalgias.  Neurological:  Positive for dizziness. Negative for weakness and numbness.  Psychiatric/Behavioral:  Negative for confusion.     Updated Vital Signs BP (!) 121/91   Pulse 84   Temp 98 F (36.7 C) (Oral)   Resp 17   Ht 5' 11 (1.803 m)   Wt (!) 140.6 kg   SpO2 92%   BMI 43.24 kg/m   Physical Exam Vitals  and nursing note reviewed.  Constitutional:      General: He is not in acute distress.    Appearance: He is not ill-appearing or toxic-appearing.  Eyes:     Conjunctiva/sclera: Conjunctivae normal.     Pupils: Pupils are equal, round, and reactive to light.  Cardiovascular:     Rate and Rhythm: Normal rate and regular rhythm.     Pulses: Normal pulses.  Pulmonary:     Effort: Pulmonary effort is normal. No respiratory distress.     Breath sounds: Normal breath sounds. No wheezing.     Comments: Slightly diminished lung sounds bilaterally.  No increased work of breathing on exam Abdominal:     Palpations: Abdomen is soft.     Tenderness: There is no abdominal tenderness.  Musculoskeletal:        General: Normal range of motion.     Right lower leg: No edema.     Left lower leg: No edema.  Skin:    General: Skin is warm.  Neurological:     General: No focal deficit present.     Mental Status: He is alert.     Sensory: No sensory deficit.     Motor: No weakness.     (all labs ordered are listed, but only abnormal results are displayed) Labs Reviewed  BASIC METABOLIC PANEL WITH GFR - Abnormal; Notable for the following components:      Result Value   Glucose, Bld 183 (*)    All other components within normal limits  CBC - Abnormal; Notable for the following components:   RBC 6.11 (*)    Hemoglobin 17.3 (*)    All other components within normal limits  D-DIMER, QUANTITATIVE  TROPONIN I (HIGH SENSITIVITY)  TROPONIN I (HIGH SENSITIVITY)    EKG: EKG Interpretation Date/Time:  Friday June 09 2024 09:39:39 EDT Ventricular Rate:  78 PR Interval:  176 QRS Duration:  85 QT Interval:  383 QTC Calculation: 437 R Axis:   48  Text Interpretation: Sinus rhythm ST elev, probable normal early repol pattern Nonspecific ST abnormality No significant change since last tracing Confirmed by Bernard Drivers ([id number]) on 06/09/2024 9:44:50 AM  Radiology: [facility] Chest 2 View Result Date:  06/09/2024 CLINICAL DATA:  Shortness of breath, dizziness. EXAM: CHEST - 2 VIEW COMPARISON:  November 26, 2023. FINDINGS: The heart size and mediastinal contours are within normal limits. Both lungs are clear. The visualized skeletal structures are unremarkable. IMPRESSION: No active cardiopulmonary disease. Electronically Signed   By: Lynwood Landy Raddle M.D.   On: 06/09/2024 10:25     Procedures   Medications Ordered in the ED  ipratropium-albuterol  (DUONEB) 0.5-2.5 (3) MG/3ML nebulizer solution 3 mL (has no administration in time range)  Medical Decision Making Pt here with shortness of breath, onset upon waking this morning at 6:30 am.  Used albuterol  MDI w/o relief.  He endorses history of sarcoidosis and obstructive sleep apnea.  Does not currently wear his CPAP.  States his baseline O2 is chronically low.  Does not require supplemental oxygen.  He is unsure why his oxygen level is low.  He is followed by pulmonology  PE, ACS, pneumonia, bronchitis viral process, COPD all considered.  Patient is concerned about his shortness of breath as his mother has history of pulmonary fibrosis.  Amount and/or Complexity of Data Reviewed Labs: ordered.    Details: No evidence of leukocytosis, chemistries unremarkable, D-dimer and troponin reassuring Radiology: ordered.    Details: Chest x-ray without acute cardiopulmonary process ECG/medicine tests: ordered.    Details: EKG shows sinus rhythm, ST elevation, probable early repull, nonspecific T wave abnormalities.  No significant changes since previous tracing Discussion of management or test interpretation with external provider(s): Patient was here with hypoxia on arrival, has history of sarcoidosis and asthma.  Lung sounds were slightly diminished on arrival but I did not appreciate any rales or wheezing.  O2 sat did improve with 2 L of O2 here.  He is not on supplemental oxygen at baseline.  He does have albuterol   MDI at home which he uses.  He has received nebs here along with Solu-Medrol .  On room air, he does maintain a low to normal O2 sat.  Looking back on previous ER visits he has had low oxygen saturation at time as well.  He states this is baseline for him.  He states he is followed by pulmonology.  Is supposed to wear CPAP but does not wear it routinely. After nebs here, he reports feeling better and states he is asymptomatic.  He is ambulated in the department without significant drop in his O2 sat. I have offered hospital admission, but patient declined stating that he prefers to go home at this time as he has a funeral to attend tomorrow.  He does have portable pulse oximeter at home and states he will monitor his oxygen levels at home and agrees to return to the ER if his symptoms worsen. He also agrees to wear his CPAP   Risk Prescription drug management.        Final diagnoses:  Shortness of breath    ED Discharge Orders     None          Herlinda Milling, PA-C 06/12/24 1211    Bernard Drivers, MD 06/12/24 [phone]

## 2024-06-09 NOTE — ED Triage Notes (Signed)
 Pt c/o SOB with dizziness this morning. Pt states he has asthma and he used his inhaler this morning.

## 2024-08-07 ENCOUNTER — Emergency Department (HOSPITAL_COMMUNITY)

## 2024-08-07 ENCOUNTER — Inpatient Hospital Stay (HOSPITAL_COMMUNITY)
Admission: EM | Admit: 2024-08-07 | Discharge: 2024-08-10 | DRG: 196 | Disposition: A | Discharge status: Home / Self Care | Attending: Internal Medicine | Admitting: Internal Medicine

## 2024-08-07 ENCOUNTER — Encounter (HOSPITAL_COMMUNITY): Payer: Self-pay | Admitting: Emergency Medicine

## 2024-08-07 ENCOUNTER — Other Ambulatory Visit: Payer: Self-pay

## 2024-08-07 DIAGNOSIS — I252 Old myocardial infarction: Secondary | ICD-10-CM | POA: Diagnosis not present

## 2024-08-07 DIAGNOSIS — Z832 Family history of diseases of the blood and blood-forming organs and certain disorders involving the immune mechanism: Secondary | ICD-10-CM

## 2024-08-07 DIAGNOSIS — K219 Gastro-esophageal reflux disease without esophagitis: Secondary | ICD-10-CM | POA: Diagnosis present

## 2024-08-07 DIAGNOSIS — N179 Acute kidney failure, unspecified: Secondary | ICD-10-CM | POA: Diagnosis present

## 2024-08-07 DIAGNOSIS — G4733 Obstructive sleep apnea (adult) (pediatric): Secondary | ICD-10-CM | POA: Diagnosis present

## 2024-08-07 DIAGNOSIS — J9601 Acute respiratory failure with hypoxia: Secondary | ICD-10-CM | POA: Diagnosis present

## 2024-08-07 DIAGNOSIS — D860 Sarcoidosis of lung: Secondary | ICD-10-CM | POA: Diagnosis not present

## 2024-08-07 DIAGNOSIS — Z79899 Other long term (current) drug therapy: Secondary | ICD-10-CM

## 2024-08-07 DIAGNOSIS — F17290 Nicotine dependence, other tobacco product, uncomplicated: Secondary | ICD-10-CM | POA: Diagnosis present

## 2024-08-07 DIAGNOSIS — Z6841 Body Mass Index (BMI) 40.0 and over, adult: Secondary | ICD-10-CM

## 2024-08-07 DIAGNOSIS — E1165 Type 2 diabetes mellitus with hyperglycemia: Secondary | ICD-10-CM | POA: Diagnosis present

## 2024-08-07 DIAGNOSIS — E66813 Obesity, class 3: Secondary | ICD-10-CM | POA: Diagnosis present

## 2024-08-07 DIAGNOSIS — E869 Volume depletion, unspecified: Secondary | ICD-10-CM | POA: Diagnosis present

## 2024-08-07 DIAGNOSIS — E785 Hyperlipidemia, unspecified: Secondary | ICD-10-CM | POA: Diagnosis present

## 2024-08-07 DIAGNOSIS — F17220 Nicotine dependence, chewing tobacco, uncomplicated: Secondary | ICD-10-CM | POA: Diagnosis present

## 2024-08-07 DIAGNOSIS — I10 Essential (primary) hypertension: Secondary | ICD-10-CM | POA: Diagnosis present

## 2024-08-07 DIAGNOSIS — Z8042 Family history of malignant neoplasm of prostate: Secondary | ICD-10-CM

## 2024-08-07 DIAGNOSIS — J45909 Unspecified asthma, uncomplicated: Secondary | ICD-10-CM | POA: Diagnosis present

## 2024-08-07 DIAGNOSIS — Z91199 Patient's noncompliance with other medical treatment and regimen due to unspecified reason: Secondary | ICD-10-CM | POA: Diagnosis not present

## 2024-08-07 DIAGNOSIS — Z86718 Personal history of other venous thrombosis and embolism: Secondary | ICD-10-CM | POA: Diagnosis not present

## 2024-08-07 DIAGNOSIS — Z8249 Family history of ischemic heart disease and other diseases of the circulatory system: Secondary | ICD-10-CM | POA: Diagnosis not present

## 2024-08-07 DIAGNOSIS — R739 Hyperglycemia, unspecified: Secondary | ICD-10-CM | POA: Diagnosis present

## 2024-08-07 DIAGNOSIS — I2510 Atherosclerotic heart disease of native coronary artery without angina pectoris: Secondary | ICD-10-CM | POA: Diagnosis present

## 2024-08-07 LAB — CBC
HCT: 47.7 % (ref 39.0–52.0)
Hemoglobin: 16.5 g/dL (ref 13.0–17.0)
MCH: 30.2 pg (ref 26.0–34.0)
MCHC: 34.6 g/dL (ref 30.0–36.0)
MCV: 87.2 fL (ref 80.0–100.0)
Platelets: 196 K/uL (ref 150–400)
RBC: 5.47 MIL/uL (ref 4.22–5.81)
RDW: 13.3 % (ref 11.5–15.5)
WBC: 8.4 K/uL (ref 4.0–10.5)
nRBC: 0 % (ref 0.0–0.2)

## 2024-08-07 LAB — BLOOD GAS, VENOUS
Acid-Base Excess: 3 mmol/L — ABNORMAL HIGH (ref 0.0–2.0)
Bicarbonate: 29 mmol/L — ABNORMAL HIGH (ref 20.0–28.0)
Drawn by: 66297
O2 Saturation: 90.2 %
Patient temperature: 36.9
pCO2, Ven: 49 mmHg (ref 44–60)
pH, Ven: 7.38 (ref 7.25–7.43)
pO2, Ven: 59 mmHg — ABNORMAL HIGH (ref 32–45)

## 2024-08-07 LAB — BASIC METABOLIC PANEL WITH GFR
Anion gap: 14 (ref 5–15)
BUN: 16 mg/dL (ref 6–20)
CO2: 23 mmol/L (ref 22–32)
Calcium: 9 mg/dL (ref 8.9–10.3)
Chloride: 100 mmol/L (ref 98–111)
Creatinine, Ser: 1.43 mg/dL — ABNORMAL HIGH (ref 0.61–1.24)
GFR, Estimated: >60 mL/min (ref >60)
Glucose, Bld: 208 mg/dL — ABNORMAL HIGH (ref 70–99)
Potassium: 3.5 mmol/L (ref 3.5–5.1)
Sodium: 137 mmol/L (ref 135–145)

## 2024-08-07 LAB — TROPONIN I (HIGH SENSITIVITY)
Troponin I (High Sensitivity): 6 ng/L (ref <18)
Troponin I (High Sensitivity): 6 ng/L (ref <18)

## 2024-08-07 LAB — BRAIN NATRIURETIC PEPTIDE: B Natriuretic Peptide: 13 pg/mL (ref 0.0–100.0)

## 2024-08-07 MED ORDER — ONDANSETRON HCL 4 MG/2ML IJ SOLN: 4.0000 mg | Freq: Four times a day (QID) | INTRAMUSCULAR | Status: DC | PRN

## 2024-08-07 MED ORDER — METOPROLOL TARTRATE 50 MG PO TABS
50.0000 mg | ORAL_TABLET | Freq: Two times a day (BID) | ORAL | Status: DC
  Administered 2024-08-08 – 2024-08-10 (×3): 50 mg via ORAL
  Filled 2024-08-07 (×4): qty 1

## 2024-08-07 MED ORDER — ENOXAPARIN SODIUM 80 MG/0.8ML IJ SOSY
70.0000 mg | PREFILLED_SYRINGE | INTRAMUSCULAR | Status: DC
  Administered 2024-08-07 – 2024-08-09 (×3): 70 mg via SUBCUTANEOUS
  Filled 2024-08-07 (×3): qty 0.8

## 2024-08-07 MED ORDER — ACETAMINOPHEN 650 MG RE SUPP: 650.0000 mg | Freq: Four times a day (QID) | RECTAL | Status: DC | PRN

## 2024-08-07 MED ORDER — IOHEXOL 350 MG/ML SOLN
100.0000 mL | Freq: Once | INTRAVENOUS | Status: AC | PRN
  Administered 2024-08-07: 100 mL via INTRAVENOUS

## 2024-08-07 MED ORDER — METHYLPREDNISOLONE SODIUM SUCC 40 MG IJ SOLR
40.0000 mg | Freq: Two times a day (BID) | INTRAMUSCULAR | Status: DC
Start: 2024-08-08 — End: 2024-08-10
  Administered 2024-08-08 – 2024-08-10 (×5): 40 mg via INTRAVENOUS
  Filled 2024-08-07 (×5): qty 1

## 2024-08-07 MED ORDER — IPRATROPIUM-ALBUTEROL 0.5-2.5 (3) MG/3ML IN SOLN
3.0000 mL | Freq: Four times a day (QID) | RESPIRATORY_TRACT | Status: DC
  Administered 2024-08-07 – 2024-08-08 (×2): 3 mL via RESPIRATORY_TRACT
  Filled 2024-08-07 (×2): qty 3

## 2024-08-07 MED ORDER — ONDANSETRON HCL 4 MG PO TABS: 4.0000 mg | ORAL_TABLET | Freq: Four times a day (QID) | ORAL | Status: DC | PRN

## 2024-08-07 MED ORDER — SODIUM CHLORIDE 0.9 % IV SOLN: INTRAVENOUS | Status: AC

## 2024-08-07 MED ORDER — METHYLPREDNISOLONE SODIUM SUCC 125 MG IJ SOLR
60.0000 mg | Freq: Once | INTRAMUSCULAR | Status: AC
Start: 2024-08-07 — End: 2024-08-07
  Administered 2024-08-07: 60 mg via INTRAVENOUS
  Filled 2024-08-07: qty 2

## 2024-08-07 MED ORDER — IPRATROPIUM-ALBUTEROL 0.5-2.5 (3) MG/3ML IN SOLN
3.0000 mL | Freq: Once | RESPIRATORY_TRACT | Status: AC
  Administered 2024-08-07: 3 mL via RESPIRATORY_TRACT
  Filled 2024-08-07: qty 3

## 2024-08-07 MED ORDER — MAGNESIUM HYDROXIDE 400 MG/5ML PO SUSP: 30.0000 mL | Freq: Every day | ORAL | Status: DC | PRN

## 2024-08-07 MED ORDER — AMLODIPINE BESYLATE 5 MG PO TABS
5.0000 mg | ORAL_TABLET | Freq: Every day | ORAL | Status: DC
  Administered 2024-08-08 – 2024-08-10 (×3): 5 mg via ORAL
  Filled 2024-08-07 (×3): qty 1

## 2024-08-07 MED ORDER — PANTOPRAZOLE SODIUM 40 MG PO TBEC
40.0000 mg | DELAYED_RELEASE_TABLET | Freq: Two times a day (BID) | ORAL | Status: DC
  Administered 2024-08-08 – 2024-08-10 (×5): 40 mg via ORAL
  Filled 2024-08-07 (×6): qty 1

## 2024-08-07 MED ORDER — VITAMIN D 25 MCG (1000 UNIT) PO TABS
1000.0000 [IU] | ORAL_TABLET | Freq: Every morning | ORAL | Status: DC
  Administered 2024-08-08 – 2024-08-10 (×3): 1000 [IU] via ORAL
  Filled 2024-08-07 (×3): qty 1

## 2024-08-07 MED ORDER — TRAZODONE HCL 50 MG PO TABS: 25.0000 mg | ORAL_TABLET | Freq: Every evening | ORAL | Status: DC | PRN

## 2024-08-07 MED ORDER — ATORVASTATIN CALCIUM 40 MG PO TABS
80.0000 mg | ORAL_TABLET | Freq: Every day | ORAL | Status: DC
  Administered 2024-08-08 – 2024-08-10 (×3): 80 mg via ORAL
  Filled 2024-08-07 (×3): qty 2

## 2024-08-07 MED ORDER — ACETAMINOPHEN 325 MG PO TABS: 650.0000 mg | ORAL_TABLET | Freq: Four times a day (QID) | ORAL | Status: DC | PRN

## 2024-08-07 NOTE — Assessment & Plan Note (Signed)
-   Will continue PPI therapy.

## 2024-08-07 NOTE — Assessment & Plan Note (Signed)
-   Will check hemoglobin A1c. - Will place him on supplement coverage with NovoLog .

## 2024-08-07 NOTE — Assessment & Plan Note (Signed)
-   The patient will be admitted to a medical telemetry bed. - O2 protocol will be provided. - This could be related to sarcoidosis exacerbation. - Management otherwise as below.

## 2024-08-07 NOTE — Assessment & Plan Note (Signed)
 Will continue statin therapy

## 2024-08-07 NOTE — ED Notes (Signed)
 Patient states that he feels like a small child is sitting on his chest. Reports this has not changed since the symptoms began.

## 2024-08-07 NOTE — Assessment & Plan Note (Signed)
-   The patient will be placed on scheduled and as needed DuoNebs. - Will add steroid therapy with IV Solu-Medrol .

## 2024-08-07 NOTE — Assessment & Plan Note (Signed)
-   Will continue antihypertensive therapy.

## 2024-08-07 NOTE — ED Triage Notes (Signed)
 Pt states he woke up this morning feel sob. Says he checked his O2 at home and it was reading 83-91% on RA.

## 2024-08-07 NOTE — ED Provider Notes (Signed)
 [place] EMERGENCY DEPARTMENT AT [facility] Provider Note  CSN: [id number] Arrival date & time: 08/07/24 1252  Chief Complaint(s) Shortness of Breath  HPI Zachary Weeks is a 43 y.o. 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  Patient reports progressive shortness of breath over the past month.  Was seen in the ER around 2 months ago for similar symptoms.  Reports today he was at work and got pretty dyspneic and decided to come back to the ER.  Reports some slight pleuritic chest pain.  No cough.  No fevers or chills.  Also had some chest pressure earlier.  Currently without any chest pain.  No leg swelling.  No recent travel or surgeries.  Denies similar episode.   Past Medical History Past Medical History:  Diagnosis Date   Asthma    DVT (deep venous thrombosis) (HCC)    Dyspnea    at times - no known reason.    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   Myocardial infarction [facility])    Pneumonia    3- 50 years of age   Pre-diabetes    Sarcoidosis    Sleep apnea    CPAP @ HS   Patient Active Problem List   Diagnosis Date Noted   Acute respiratory failure with hypoxia (HCC) 08/07/2024   Sarcoidosis of lung (HCC) 08/07/2024   Dyslipidemia 08/07/2024   Essential hypertension 08/07/2024   GERD without esophagitis 08/07/2024   AKI (acute kidney injury) (HCC) 08/07/2024   Change in bowel habits 05/10/2024   Sinus tachycardia 05/20/2023   Low iron 09/21/2022   Hilar adenopathy 07/11/2021   NSVT (nonsustained ventricular tachycardia) (HCC) 06/26/2021   Coronary artery disease involving native coronary artery of native heart without angina pectoris 05/13/2021   HTN (hypertension) 05/06/2021   Hypoxia 05/06/2021   GERD (gastroesophageal reflux disease) 05/06/2021   Hyperlipidemia 05/06/2021    Hyperglycemia 05/06/2021   NSTEMI (non-ST elevated myocardial infarction) (HCC) 05/05/2021   Sarcoidosis 10/18/2020   OSA (obstructive sleep apnea) 10/18/2020   CKD (chronic kidney disease) stage 3, GFR 30-59 ml/min (HCC) 10/18/2020   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   Home Medication(s) Prior to Admission medications   Medication Sig Start Date End Date Taking? Authorizing Provider  albuterol  (VENTOLIN  HFA) 108 (90 Base) MCG/ACT inhaler Inhale 2-3 puffs into the lungs every 6 (six) hours as needed for wheezing or shortness of breath.   Yes [provider]  amLODipine  (NORVASC ) 5 MG tablet TAKE 1 TABLET BY MOUTH DAILY 06/05/24  Yes Patwardhan, Manish J, MD  atorvastatin  (LIPITOR ) 80 MG tablet Take 1 tablet (80 mg total) by mouth daily. 05/20/23  Yes Patwardhan, Manish J, MD  Cholecalciferol  (VITAMIN D3) 1000 units CAPS Take 1,000 Units by mouth in the morning. 06/08/22  Yes [provider]  cyclobenzaprine  (FLEXERIL ) 10 MG tablet Take 10 mg by mouth daily as needed for muscle spasms. 04/26/21  Yes [provider]  metoprolol  tartrate (LOPRESSOR ) 50 MG tablet TAKE 1 TABLET BY MOUTH TWICE DAILY 06/05/24  Yes Patwardhan, Manish J, MD  pantoprazole  (PROTONIX ) 40 MG tablet TAKE 1 TABLET BY MOUTH TWICE DAILY 06/05/24  Yes Shirlean Therisa ORN, NP  Past Surgical History Past Surgical History:  Procedure Laterality Date   BIOPSY  10/08/2022   Procedure: BIOPSY;  Surgeon: Cindie Carlin POUR, DO;  Location: [facility] ENDO SUITE;  Service: Endoscopy;;   BRONCHIAL NEEDLE ASPIRATION BIOPSY  11/01/2020   Procedure: BRONCHIAL NEEDLE ASPIRATION BIOPSIES;  Surgeon: Jude Harden GAILS, MD;  Location: [facility] ENDOSCOPY;  Service: Cardiopulmonary;;   BRONCHIAL WASHINGS  11/01/2020   Procedure: BRONCHIAL WASHINGS;  Surgeon: Jude Harden GAILS, MD;  Location: [facility] ENDOSCOPY;  Service:  Cardiopulmonary;;   CARDIAC CATHETERIZATION     COLONOSCOPY WITH PROPOFOL  N/A 10/08/2022   Procedure: COLONOSCOPY WITH PROPOFOL ;  Surgeon: Cindie Carlin POUR, DO;  Location: [facility] ENDO [facility];  Service: Endoscopy;  Laterality: N/A;  11:30 AM   CORONARY ULTRASOUND/IVUS N/A 05/05/2021   Procedure: Intravascular Ultrasound/IVUS;  Surgeon: Elmira Newman PARAS, MD;  Location: [facility] INVASIVE CV LAB;  Service: Cardiovascular;  Laterality: N/A;   ESOPHAGOGASTRODUODENOSCOPY (EGD) WITH PROPOFOL  N/A 10/08/2022   Procedure: ESOPHAGOGASTRODUODENOSCOPY (EGD) WITH PROPOFOL ;  Surgeon: Cindie Carlin POUR, DO;  Location: [facility];  Service: Endoscopy;  Laterality: N/A;   HEMOSTASIS CLIP PLACEMENT  10/08/2022   Procedure: HEMOSTASIS CLIP PLACEMENT;  Surgeon: Cindie Carlin POUR, DO;  Location: [facility];  Service: Endoscopy;;   LEFT HEART CATH AND CORONARY ANGIOGRAPHY N/A 05/05/2021   Procedure: LEFT HEART CATH AND CORONARY ANGIOGRAPHY;  Surgeon: Elmira Newman PARAS, MD;  Location: [facility] INVASIVE CV LAB;  Service: Cardiovascular;  Laterality: N/A;   MEDIASTINOSCOPY N/A 07/11/2021   Procedure: MEDIASTINOSCOPY;  Surgeon: Kerrin Elspeth BROCKS, MD;  [facility];  Service: Thoracic;  Laterality: N/A;   POLYPECTOMY  10/08/2022   Procedure: POLYPECTOMY;  Surgeon: Cindie Carlin POUR, DO;  Location: [facility];  Service: Endoscopy;;   SUBMUCOSAL LIFTING INJECTION  10/08/2022   Procedure: SUBMUCOSAL LIFTING INJECTION;  Surgeon: Cindie Carlin POUR, DO;  Location: [facility];  Service: Endoscopy;;   SUBMUCOSAL TATTOO INJECTION  10/08/2022   Procedure: SUBMUCOSAL TATTOO INJECTION;  Surgeon: Cindie Carlin POUR, DO;  Location: [facility];  Service: Endoscopy;;   VIDEO BRONCHOSCOPY N/A 11/01/2020   Procedure: VIDEO BRONCHOSCOPY WITH ENDOBRONCHIAL ULTRASOUND;  Surgeon: Jude Harden GAILS, MD;  Location: [facility] ENDOSCOPY;  Service: Cardiopulmonary;  Laterality: N/A;   Family History Family History  Problem Relation Age of Onset    Pulmonary fibrosis Mother    Cancer Mother        cervical   Cancer Father        prostate   Hypertension Father    Lupus Maternal Grandmother    Other Maternal Grandmother        clotting disorder   Fibromyalgia Maternal Aunt    Other Other        clotting disorder   Colon cancer Neg Hx     Social History Social History   Tobacco Use   Smoking status: Former    Current packs/day: 0.00    Average packs/day: 1 pack/day for 3.0 years (3.0 ttl pk-yrs)    Types: Cigarettes    Start date: 66    Quit date: 1999    Years since quitting: 26.7    Passive exposure: Current   Smokeless tobacco: Current    Types: Chew, Snuff  Vaping Use   Vaping status: Never Used  Substance Use Topics   Alcohol use: Yes    Comment: rarely- maybe 2 mixed drinks in a year   Drug use: Never   Allergies Patient has no known allergies.  Review of Systems Review  of Systems  All other systems reviewed and are negative.   Physical Exam Vital Signs  I have reviewed the triage vital signs BP 119/76   Pulse 83   Temp 98 F (36.7 C)   Resp 17   Ht 5' 11 (1.803 m)   Wt (!) 140 kg   SpO2 94%   BMI 43.05 kg/m  Physical Exam Vitals and nursing note reviewed.  Constitutional:      General: He is not in acute distress.    Appearance: Normal appearance.  HENT:     Mouth/Throat:     Mouth: Mucous membranes are moist.  Eyes:     Conjunctiva/sclera: Conjunctivae normal.  Cardiovascular:     Rate and Rhythm: Normal rate and regular rhythm.  Pulmonary:     Effort: Pulmonary effort is normal. No respiratory distress.     Breath sounds: Normal breath sounds.  Abdominal:     General: Abdomen is flat.     Palpations: Abdomen is soft.     Tenderness: There is no abdominal tenderness.  Musculoskeletal:     Right lower leg: No edema.     Left lower leg: No edema.  Skin:    General: Skin is warm and dry.     Capillary Refill: Capillary refill takes less than 2 seconds.  Neurological:      Mental Status: He is alert and oriented to person, place, and time. Mental status is at baseline.  Psychiatric:        Mood and Affect: Mood normal.        Behavior: Behavior normal.     ED Results and Treatments Labs (all labs ordered are listed, but only abnormal results are displayed) Labs Reviewed  BASIC METABOLIC PANEL WITH GFR - Abnormal; Notable for the following components:      Result Value   Glucose, Bld 208 (*)    Creatinine, Ser 1.43 (*)    All other components within normal limits  BLOOD GAS, VENOUS - Abnormal; Notable for the following components:   pO2, Ven 59 (*)    Bicarbonate 29.0 (*)    Acid-Base Excess 3.0 (*)    All other components within normal limits  CBC  BRAIN NATRIURETIC PEPTIDE  HIV ANTIBODY (ROUTINE TESTING W REFLEX)  BASIC METABOLIC PANEL WITH GFR  CBC  TROPONIN I (HIGH SENSITIVITY)  TROPONIN I (HIGH SENSITIVITY)                                                                                                                          Radiology CT Angio Chest PE W and/or Wo Contrast Result Date: 08/07/2024 CLINICAL DATA:  Shortness of breath, hypoxia. History of sarcoidosis, diagnosed at mediastinoscopy with lymph node biopsy on August 2022. EXAM: CT ANGIOGRAPHY CHEST WITH CONTRAST TECHNIQUE: Multidetector CT imaging of the chest was performed using the standard protocol during bolus administration of intravenous contrast. Multiplanar CT image reconstructions and MIPs were obtained to evaluate the vascular anatomy. RADIATION DOSE  REDUCTION: This exam was performed according to the departmental dose-optimization program which includes automated exposure control, adjustment of the mA and/or kV according to patient size and/or use of iterative reconstruction technique. CONTRAST:  OMNIPAQUE  IOHEXOL  350 MG/ML SOLN COMPARISON:  06/16/2021 FINDINGS: Cardiovascular: No filling defect is identified in the pulmonary arterial tree to suggest pulmonary embolus.  Mediastinum/Nodes: Enlarged AP window, prevascular, paratracheal, hilar, and subcarinal lymph nodes are present. Index right hilar node 2.2 cm in short axis on image 43 series 4, formerly 1.9 cm. Index subcarinal node 3.1 cm in short axis on image 46 series 4, formerly 2.7 cm. Lungs/Pleura: Mild bilateral airway thickening. Scarring or atelectasis in both lower lobes in the lingula. Upper Abdomen: Unremarkable Musculoskeletal: Mid to lower thoracic spondylosis. Review of the MIP images confirms the above findings. IMPRESSION: 1. No filling defect is identified in the pulmonary arterial tree to suggest pulmonary embolus. 2. Enlarged mediastinal and hilar lymph nodes, mildly increased in size from 06/16/2021. Appearance compatible with known diagnosis of sarcoidosis. 3. Mild bilateral airway thickening. 4. Scarring or atelectasis in both lower lobes and the lingula. Electronically Signed   By: [name] M.D.   On: 08/07/2024 17:59   DG Chest 2 View Result Date: 08/07/2024 CLINICAL DATA:  Shortness of breath on awakening today. Hypoxemia. History of asthma. EXAM: CHEST - 2 VIEW COMPARISON:  Radiographs 06/09/2024 and 11/26/2023. FINDINGS: The heart size and mediastinal contours are stable. The lungs are clear. No pleural effusion or pneumothorax. Stable mild degenerative changes in the spine without evidence of acute osseous abnormality. IMPRESSION: Stable chest. No active cardiopulmonary process. Electronically Signed   By: [name] M.D.   On: 08/07/2024 14:26    Pertinent labs & imaging results that were available during my care of the patient were reviewed by me and considered in my medical decision making (see MDM for details).  Medications Ordered in ED Medications  amLODipine  (NORVASC ) tablet 5 mg (has no administration in time range)  atorvastatin  (LIPITOR ) tablet 80 mg (has no administration in time range)  metoprolol  tartrate (LOPRESSOR ) tablet 50 mg (has no administration in time  range)  pantoprazole  (PROTONIX ) EC tablet 40 mg (has no administration in time range)  cholecalciferol  (VITAMIN D3) 25 MCG (1000 UNIT) tablet 1,000 Units (has no administration in time range)  enoxaparin  (LOVENOX ) injection 70 mg (70 mg Subcutaneous Given 08/07/24 2055)  0.9 %  sodium chloride  infusion (has no administration in time range)  acetaminophen  (TYLENOL ) tablet 650 mg (has no administration in time range)    Or  acetaminophen  (TYLENOL ) suppository 650 mg (has no administration in time range)  traZODone  (DESYREL ) tablet 25 mg (has no administration in time range)  magnesium  hydroxide (MILK OF MAGNESIA) suspension 30 mL (has no administration in time range)  ondansetron  (ZOFRAN ) tablet 4 mg (has no administration in time range)    Or  ondansetron  (ZOFRAN ) injection 4 mg (has no administration in time range)  ipratropium-albuterol  (DUONEB) 0.5-2.5 (3) MG/3ML nebulizer solution 3 mL (3 mLs Nebulization Given 08/07/24 2039)  methylPREDNISolone  sodium succinate (SOLU-MEDROL ) 40 mg/mL injection 40 mg (has no administration in time range)  iohexol  (OMNIPAQUE ) 350 MG/ML injection 100 mL (100 mLs Intravenous Contrast Given 08/07/24 1722)  ipratropium-albuterol  (DUONEB) 0.5-2.5 (3) MG/3ML nebulizer solution 3 mL (3 mLs Nebulization Given 08/07/24 1834)  methylPREDNISolone  sodium succinate (SOLU-MEDROL ) 125 mg/2 mL injection 60 mg (60 mg Intravenous Given 08/07/24 2053)  Procedures Procedures  (including critical care time)  Medical Decision Making / ED Course   MDM:  43 year old presenting to the emergency department shortness of breath.  Patient overall well-appearing, physical examination without significant abnormality.  Does occasionally dip to 89% on room air with good waveform but seems to come back up quickly.  Differential includes pneumonia,  pneumothorax, pulmonary embolism, ACS, obesity hypoventilation syndrome, asthma.  Does have history of DVT and not on anticoagulation so we will check the PE study.  Was seen a month ago and had negative D-dimer but does seem higher risk.  Also check troponin.  EKG no significant change from prior.  Symptoms atypical for ACS.  Chest x-ray without evidence of pneumonia or pneumothorax.  Will reassess.  Clinical Course as of 08/07/24 2213  Mon Aug 07, 2024  2035 Workup is overall without clear cause of the patient's symptoms.  BNP is normal.  Troponin is negative.  CTA chest without evidence of pulmonary embolism or other acute process.  He remains hypoxic.  Attempted to ambulate off oxygen however patient became hypoxic again to 89%.  Differential includes shunt, obesity hypoventilation syndrome.  Patient may need echocardiogram, further inpatient monitoring. [WS]    Clinical Course User Index [WS] Francesca Elsie CROME, MD     Additional history obtained:  -External records from outside source obtained and reviewed including: Chart review including previous notes, labs, imaging, consultation notes including prior notes    Lab Tests: -I ordered, reviewed, and interpreted labs.   The pertinent results include:   Labs Reviewed  BASIC METABOLIC PANEL WITH GFR - Abnormal; Notable for the following components:      Result Value   Glucose, Bld 208 (*)    Creatinine, Ser 1.43 (*)    All other components within normal limits  BLOOD GAS, VENOUS - Abnormal; Notable for the following components:   pO2, Ven 59 (*)    Bicarbonate 29.0 (*)    Acid-Base Excess 3.0 (*)    All other components within normal limits  CBC  BRAIN NATRIURETIC PEPTIDE  HIV ANTIBODY (ROUTINE TESTING W REFLEX)  BASIC METABOLIC PANEL WITH GFR  CBC  TROPONIN I (HIGH SENSITIVITY)  TROPONIN I (HIGH SENSITIVITY)    Notable for mild AKI  EKG   EKG Interpretation Date/Time:  Monday August 07 2024 14:11:33 EDT Ventricular  Rate:  87 PR Interval:  180 QRS Duration:  87 QT Interval:  388 QTC Calculation: 467 R Axis:   58  Text Interpretation: Sinus rhythm Nonspecific ST abnormality No significant change since last tracing Confirmed by Francesca Elsie ([id number]) on 08/07/2024 2:27:28 PM         Imaging Studies ordered: I ordered imaging studies including CXR, CT chest On my interpretation imaging demonstrates no acute process I independently visualized and interpreted imaging. I agree with the radiologist interpretation   Medicines ordered and prescription drug management: Meds ordered this encounter  Medications   iohexol  (OMNIPAQUE ) 350 MG/ML injection 100 mL   ipratropium-albuterol  (DUONEB) 0.5-2.5 (3) MG/3ML nebulizer solution 3 mL   amLODipine  (NORVASC ) tablet 5 mg   atorvastatin  (LIPITOR ) tablet 80 mg   metoprolol  tartrate (LOPRESSOR ) tablet 50 mg   pantoprazole  (PROTONIX ) EC tablet 40 mg   cholecalciferol  (VITAMIN D3) 25 MCG (1000 UNIT) tablet 1,000 Units   enoxaparin  (LOVENOX ) injection 70 mg   0.9 %  sodium chloride  infusion   OR Linked Order Group    acetaminophen  (TYLENOL ) tablet 650 mg    acetaminophen  (TYLENOL ) suppository 650 mg  traZODone  (DESYREL ) tablet 25 mg   magnesium  hydroxide (MILK OF MAGNESIA) suspension 30 mL   OR Linked Order Group    ondansetron  (ZOFRAN ) tablet 4 mg    ondansetron  (ZOFRAN ) injection 4 mg   ipratropium-albuterol  (DUONEB) 0.5-2.5 (3) MG/3ML nebulizer solution 3 mL   methylPREDNISolone  sodium succinate (SOLU-MEDROL ) 40 mg/mL injection 40 mg   methylPREDNISolone  sodium succinate (SOLU-MEDROL ) 125 mg/2 mL injection 60 mg    -I have reviewed the patients home medicines and have made adjustments as needed   Consultations Obtained: I requested consultation with the hospitalist,  and discussed lab and imaging findings as well as pertinent plan - they recommend: admission   Cardiac Monitoring: The patient was maintained on a cardiac monitor.  I  personally viewed and interpreted the cardiac monitored which showed an underlying rhythm of: NSR  Social Determinants of Health:  Diagnosis or treatment significantly limited by social determinants of health: obesity   Reevaluation: After the interventions noted above, I reevaluated the patient and found that their symptoms have stayed the same  Co morbidities that complicate the patient evaluation  Past Medical History:  Diagnosis Date   Asthma    DVT (deep venous thrombosis) (HCC)    Dyspnea    at times - no known reason.    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   Myocardial infarction [facility])    Pneumonia    69- 58 years of age   Pre-diabetes    Sarcoidosis    Sleep apnea    CPAP @ HS      Dispostion: Disposition decision including need for hospitalization was considered, and patient admitted to the hospital.    Final Clinical Impression(s) / ED Diagnoses Final diagnoses:  Acute hypoxic respiratory failure (HCC)     This chart was dictated using voice recognition software.  Despite best efforts to proofread,  errors can occur which can change the documentation meaning.    Francesca Elsie CROME, MD 08/07/24 2213

## 2024-08-07 NOTE — ED Notes (Signed)
 Pt SPO2 90-91% on RA while ambulating, then went down to 89% when he got back into the bed.

## 2024-08-07 NOTE — Assessment & Plan Note (Signed)
-   I suspect prerenal etiology possibly secondary to volume depletion. - He will be hydrated with IV normal saline and will follow BMP. - Will avoid nephrotoxins.

## 2024-08-07 NOTE — H&P (Signed)
 [place]   PATIENT NAME: Zachary Weeks    MR#:  [id number]  DATE OF BIRTH:  [birth year]  DATE OF ADMISSION:  08/07/2024  PRIMARY CARE PHYSICIAN: Toribio Jerel MATSU, MD   Patient is coming from: Home  REQUESTING/REFERRING PHYSICIAN: Francesca Fallow, MD  CHIEF COMPLAINT:   Chief Complaint  Patient presents with   Shortness of Breath    HISTORY OF PRESENT ILLNESS:  Zachary Weeks is a 43 y.o. male with medical history significant for sarcoidosis, asthma, GERD, essential hypertension and OSA on CPAP who presented to the emergency room with acute onset of worsening dyspnea over the last few days with associated cough without significant expectoration and without significant wheezing.  He was seen in the emergency room a couple months ago for similar symptoms.  He got significantly dyspneic today while at work and decided to come to the ER.  He was having associated pleuritic chest pain before but  denied any current no nausea or vomiting or diaphoresis.  No radiation of his chest pain.  No fever or chills.  No worsening lower extremity edema, orthopnea or paroxysmal nocturnal dyspnea.  He has not used his CPAP over the last 3 nights.  No dysuria, oliguria or hematuria or flank pain.  ED Course: When he came to the ER Pulsoxymeter was 89% on room air and 93% on 4 L of O2 via nasal cannula.  Vital signs were otherwise within normal.  Labs revealed borderline potassium of 3.5.  Blood glucose of 208.  Creatinine 1.43 compared to 1.18 on 06/09/2024.  Venous blood gas showed pH 7.38 and HCO3 of 29.  High sensitive troponin I was 6 twice and BNP 13.  CBC was normal. EKG as reviewed by me :  EKG showed sinus rhythm with a rate of 87 with probable progression Imaging: 2 view chest x-ray showed stable chest with no acute cardiopulmonary disease.  Chest CTA revealed the following: 1. No filling defect is identified in the pulmonary arterial tree to suggest pulmonary embolus. 2. Enlarged  mediastinal and hilar lymph nodes, mildly increased in size from 06/16/2021. Appearance compatible with known diagnosis of sarcoidosis. 3. Mild bilateral airway thickening. 4. Scarring or atelectasis in both lower lobes and the lingula.   The patient was given DuoNeb.  He will be admitted to a medical telemetry bed for further evaluation and management. PAST MEDICAL HISTORY:   Past Medical History:  Diagnosis Date   Asthma    DVT (deep venous thrombosis) (HCC)    Dyspnea    at times - no known reason.    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   Myocardial infarction [facility])    Pneumonia    36- 36 years of age   Pre-diabetes    Sarcoidosis    Sleep apnea    CPAP @ [facility]    PAST SURGICAL HISTORY:   Past Surgical History:  Procedure Laterality Date   BIOPSY  10/08/2022   Procedure: BIOPSY;  Surgeon: Cindie Carlin POUR, DO;  Location: [facility] ENDO [facility];  Service: Endoscopy;;   BRONCHIAL NEEDLE ASPIRATION BIOPSY  11/01/2020   Procedure: BRONCHIAL NEEDLE ASPIRATION BIOPSIES;  Surgeon: Jude Harden GAILS, MD;  Location: [facility] ENDOSCOPY;  Service: Cardiopulmonary;;   BRONCHIAL WASHINGS  11/01/2020   Procedure: BRONCHIAL WASHINGS;  Surgeon: Jude Harden GAILS, MD;  Location: [facility] ENDOSCOPY;  Service: Cardiopulmonary;;   CARDIAC  CATHETERIZATION     COLONOSCOPY WITH PROPOFOL  N/A 10/08/2022   Procedure: COLONOSCOPY WITH PROPOFOL ;  Surgeon: Cindie Carlin POUR, DO;  Location: [facility] ENDO [facility];  Service: Endoscopy;  Laterality: N/A;  11:30 AM   CORONARY ULTRASOUND/IVUS N/A 05/05/2021   Procedure: Intravascular Ultrasound/IVUS;  Surgeon: Elmira Newman PARAS, MD;  Location: [facility] INVASIVE CV LAB;  Service: Cardiovascular;  Laterality: N/A;   ESOPHAGOGASTRODUODENOSCOPY (EGD) WITH PROPOFOL  N/A 10/08/2022   Procedure: ESOPHAGOGASTRODUODENOSCOPY (EGD) WITH PROPOFOL ;  Surgeon: Cindie Carlin POUR, DO;  Location: [facility];  Service:  Endoscopy;  Laterality: N/A;   HEMOSTASIS CLIP PLACEMENT  10/08/2022   Procedure: HEMOSTASIS CLIP PLACEMENT;  Surgeon: Cindie Carlin POUR, DO;  Location: [facility];  Service: Endoscopy;;   LEFT HEART CATH AND CORONARY ANGIOGRAPHY N/A 05/05/2021   Procedure: LEFT HEART CATH AND CORONARY ANGIOGRAPHY;  Surgeon: Elmira Newman PARAS, MD;  Location: [facility] INVAS[facility] CV LAB;  Service: Cardiovascular;  Laterality: N/A;   MEDIASTINOSCOPY N/A 07/11/2021   Procedure: MEDIASTINOSCOPY;  Surgeon: Kerrin Elspeth BROCKS, MD;  Location: [facility] OR;  Service: Thoracic;  Laterality: N/A;   POLYPECTOMY  10/08/2022   Procedure: POLYPECTOMY;  Surgeon: Cindie Carlin POUR, DO;  [facility];  Service: Endoscopy;;   SUBMUCOSAL LIFTING INJECTION  10/08/2022   Procedure: SUBMUCOSAL LIFTING INJECTION;  Surgeon: Cindie Carlin POUR, DO;  Location: [facility];  Service: Endoscopy;;   SUBMUCOSAL TATTOO INJECTION  10/08/2022   Procedure: SUBMUCOSAL TATTOO INJECTION;  Surgeon: Cindie Carlin POUR, DO;  Location: [facility];  Service: Endoscopy;;   VIDEO BRONCHOSCOPY N/A 11/01/2020   Procedure: VIDEO BRONCHOSCOPY WITH ENDOBRONCHIAL ULTRASOUND;  Surgeon: Jude Harden GAILS, MD;  Location: [facility] ENDOSCOPY;  Service: Cardiopulmonary;  Laterality: N/A;    SOCIAL HISTORY:   Social History   Tobacco Use   Smoking status: Former    Current packs/day: 0.00    Average packs/day: 1 pack/day for 3.0 years (3.0 ttl pk-yrs)    Types: Cigarettes    Start date: 74    Quit date: 1999    Years since quitting: 26.7    Passive exposure: Current   Smokeless tobacco: Current    Types: Chew, Snuff  Substance Use Topics   Alcohol use: Yes    Comment: rarely- maybe 2 mixed drinks in a year    FAMILY HISTORY:   Family History  Problem Relation Age of Onset   Pulmonary fibrosis Mother    Cancer Mother        cervical   Cancer Father        prostate   Hypertension Father    Lupus Maternal Grandmother    Other Maternal  Grandmother        clotting disorder   Fibromyalgia Maternal Aunt    Other Other        clotting disorder   Colon cancer Neg Hx     DRUG ALLERGIES:  No Known Allergies  REVIEW OF SYSTEMS:   ROS As per history of present illness. All pertinent systems were reviewed above. Constitutional, HEENT, cardiovascular, respiratory, GI, GU, musculoskeletal, neuro, psychiatric, endocrine, integumentary and hematologic systems were reviewed and are otherwise negative/unremarkable except for positive findings mentioned above in the HPI.   MEDICATIONS AT HOME:   Prior to Admission medications   Medication Sig Start Date End Date Taking? Authorizing Provider  albuterol  (VENTOLIN  HFA) 108 (90 Base) MCG/ACT inhaler Inhale 2-3 puffs into the lungs every 6 (six) hours as needed for wheezing or shortness of breath.   Yes [provider]  amLODipine  (NORVASC ) 5 MG tablet TAKE 1 TABLET BY MOUTH DAILY 06/05/24  Yes Patwardhan, Manish J, MD  atorvastatin  (LIPITOR ) 80 MG tablet Take 1 tablet (80 mg total) by mouth daily. 05/20/23  Yes Patwardhan, Manish J, MD  Cholecalciferol  (VITAMIN D3) 1000 units CAPS Take 1,000 Units by mouth in the morning. 06/08/22  Yes [provider]  cyclobenzaprine  (FLEXERIL ) 10 MG tablet Take 10 mg by mouth daily as needed for muscle spasms. 04/26/21  Yes [provider]  metoprolol  tartrate (LOPRESSOR ) 50 MG tablet TAKE 1 TABLET BY MOUTH TWICE DAILY 06/05/24  Yes Patwardhan, Manish J, MD  pantoprazole  (PROTONIX ) 40 MG tablet TAKE 1 TABLET BY MOUTH TWICE DAILY 06/05/24  Yes Shirlean Therisa ORN, NP      VITAL SIGNS:  Blood pressure 129/87, pulse 83, temperature 97.8 F (36.6 C), temperature source Oral, resp. rate 14, height 5' 11 (1.803 m), weight (!) 140 kg, SpO2 94%.  PHYSICAL EXAMINATION:  Physical Exam  GENERAL:  43 y.o.-year-old Caucasian male patient lying in the bed with no acute distress.  EYES: Pupils equal, round, reactive to light and accommodation.  No scleral icterus. Extraocular muscles intact.  HEENT: Head atraumatic, normocephalic. Oropharynx and nasopharynx clear.  NECK:  Supple, no jugular venous distention. No thyroid enlargement, no tenderness.  LUNGS: Diminished bibasilar breath sounds with prolonged expiratory phase.. No use of accessory muscles of respiration.  CARDIOVASCULAR: Regular rate and rhythm, S1, S2 normal. No murmurs, rubs, or gallops.  ABDOMEN: Soft, nondistended, nontender. Bowel sounds present. No organomegaly or mass.  EXTREMITIES: No pedal edema, cyanosis, or clubbing.  NEUROLOGIC: Cranial nerves II through XII are intact. Muscle strength 5/5 in all extremities. Sensation intact. Gait not checked.  PSYCHIATRIC: The patient is alert and oriented x 3.  Normal affect and good eye contact. SKIN: No obvious rash, lesion, or ulcer.   LABORATORY PANEL:   CBC Recent Labs  Lab 08/07/24 1447  WBC 8.4  HGB 16.5  HCT 47.7  PLT 196   ------------------------------------------------------------------------------------------------------------------  Chemistries  Recent Labs  Lab 08/07/24 1447  NA 137  K 3.5  CL 100  CO2 23  GLUCOSE 208*  BUN 16  CREATININE 1.43*  CALCIUM  9.0   ------------------------------------------------------------------------------------------------------------------  Cardiac Enzymes No results for input(s): TROPONINI in the last 168 hours. ------------------------------------------------------------------------------------------------------------------  RADIOLOGY:  CT Angio Chest PE W and/or Wo Contrast Result Date: 08/07/2024 CLINICAL DATA:  Shortness of breath, hypoxia. History of sarcoidosis, diagnosed at mediastinoscopy with lymph node biopsy on August 2022. EXAM: CT ANGIOGRAPHY CHEST WITH CONTRAST TECHNIQUE: Multidetector CT imaging of the chest was performed using the standard protocol during bolus administration of intravenous contrast. Multiplanar CT image reconstructions  and MIPs were obtained to evaluate the vascular anatomy. RADIATION DOSE REDUCTION: This exam was performed according to the departmental dose-optimization program which includes automated exposure control, adjustment of the mA and/or kV according to patient size and/or use of iterative reconstruction technique. CONTRAST:  OMNIPAQUE  IOHEXOL  350 MG/ML SOLN COMPARISON:  06/16/2021 FINDINGS: Cardiovascular: No filling defect is identified in the pulmonary arterial tree to suggest pulmonary embolus. Mediastinum/Nodes: Enlarged AP window, prevascular, paratracheal, hilar, and subcarinal lymph nodes are present. Index right hilar node 2.2 cm in short axis on image 43 series 4, formerly 1.9 cm. Index subcarinal node 3.1 cm in short axis on image 46 series 4, formerly 2.7 cm. Lungs/Pleura: Mild bilateral airway thickening. Scarring or atelectasis in both lower lobes in the lingula. Upper Abdomen: Unremarkable Musculoskeletal: Mid to lower thoracic  spondylosis. Review of the MIP images confirms the above findings. IMPRESSION: 1. No filling defect is identified in the pulmonary arterial tree to suggest pulmonary embolus. 2. Enlarged mediastinal and hilar lymph nodes, mildly increased in size from 06/16/2021. Appearance compatible with known diagnosis of sarcoidosis. 3. Mild bilateral airway thickening. 4. Scarring or atelectasis in both lower lobes and the lingula. Electronically Signed   By: [name] M.D.   On: 08/07/2024 17:59   DG Chest 2 View Result Date: 08/07/2024 CLINICAL DATA:  Shortness of breath on awakening today. Hypoxemia. History of asthma. EXAM: CHEST - 2 VIEW COMPARISON:  Radiographs 06/09/2024 and 11/26/2023. FINDINGS: The heart size and mediastinal contours are stable. The lungs are clear. No pleural effusion or pneumothorax. Stable mild degenerative changes in the spine without evidence of acute osseous abnormality. IMPRESSION: Stable chest. No active cardiopulmonary process. Electronically  Signed   By: [name] M.D.   On: 08/07/2024 14:26      IMPRESSION AND PLAN:  Assessment and Plan: Acute respiratory failure with hypoxia (HCC) - The patient will be admitted to a medical telemetry bed. - O2 protocol will be provided. - This could be related to sarcoidosis exacerbation. - Management otherwise as below.  Sarcoidosis of lung (HCC) - The patient will be placed on scheduled and as needed DuoNebs. - Will add steroid therapy with IV Solu-Medrol .   AKI (acute kidney injury) (HCC) - I suspect prerenal etiology possibly secondary to volume depletion. - He will be hydrated with IV normal saline and will follow BMP. - Will avoid nephrotoxins.  Hyperglycemia - Will check hemoglobin A1c. - Will place him on supplement coverage with NovoLog .  Essential hypertension - Will continue antihypertensive therapy.  GERD without esophagitis - Will continue PPI therapy.  Dyslipidemia - Will continue statin therapy.   DVT prophylaxis: Lovenox .  Advanced Care Planning:  Code Status: full code.  Family Communication:  The plan of care was discussed in details with the patient (and family). I answered all questions. The patient agreed to proceed with the above mentioned plan. Further management will depend upon hospital course. Disposition Plan: Back to previous home environment Consults called: none.  All the records are reviewed and case discussed with ED provider.  Status is: Inpatient  At the time of the admission, it appears that the appropriate admission status for this patient is inpatient.  This is judged to be reasonable and necessary in order to provide the required intensity of service to ensure the patient's safety given the presenting symptoms, physical exam findings and initial radiographic and laboratory data in the context of comorbid conditions.  The patient requires inpatient status due to high intensity of service, high risk of further deterioration and  high frequency of surveillance required.  I certify that at the time of admission, it is my clinical judgment that the patient will require inpatient hospital care extending more than 2 midnights.                            Dispo: The patient is from: Home              Anticipated d/c is to: Home              Patient currently is not medically stable to d/c.              Difficult to place patient: No  [place]ison DELENA Peaches M.D on 08/07/2024 at 9:04 PM  [facility]   From 7 PM-7 AM, contact night-coverage [email or web address]  CC: Primary care physician; Toribio Jerel MATSU, MD

## 2024-08-07 NOTE — Progress Notes (Signed)
 PHARMACIST - PHYSICIAN COMMUNICATION  CONCERNING:  Enoxaparin  (Lovenox ) for DVT Prophylaxis    RECOMMENDATION: Patient was prescribed enoxaprin 40mg  q24 hours for VTE prophylaxis.   Filed Weights   08/07/24 1359  Weight: (!) 140 kg (308 lb 10.3 oz)    Body mass index is 43.05 kg/m.  Estimated Creatinine Clearance: 93.4 mL/min (A) (by C-G formula based on SCr of 1.43 mg/dL (H)).   Based on [facility] policy patient is candidate for enoxaparin  0.5mg /kg TBW SQ every 24 hours based on BMI being >30.  DESCRIPTION: Pharmacy has adjusted enoxaparin  dose per [facility] policy.  Patient is now receiving enoxaparin  70 mg every 24 hours    Zachary Weeks, PharmD Clinical Pharmacist  08/07/2024 8:35 PM

## 2024-08-08 DIAGNOSIS — I10 Essential (primary) hypertension: Secondary | ICD-10-CM

## 2024-08-08 DIAGNOSIS — N179 Acute kidney failure, unspecified: Secondary | ICD-10-CM | POA: Diagnosis not present

## 2024-08-08 DIAGNOSIS — J9601 Acute respiratory failure with hypoxia: Secondary | ICD-10-CM | POA: Diagnosis not present

## 2024-08-08 DIAGNOSIS — D860 Sarcoidosis of lung: Secondary | ICD-10-CM | POA: Diagnosis not present

## 2024-08-08 LAB — CBC
HCT: 50 % (ref 39.0–52.0)
Hemoglobin: 16.7 g/dL (ref 13.0–17.0)
MCH: 29.7 pg (ref 26.0–34.0)
MCHC: 33.4 g/dL (ref 30.0–36.0)
MCV: 89 fL (ref 80.0–100.0)
Platelets: 179 K/uL (ref 150–400)
RBC: 5.62 MIL/uL (ref 4.22–5.81)
RDW: 13.2 % (ref 11.5–15.5)
WBC: 6.2 K/uL (ref 4.0–10.5)
nRBC: 0 % (ref 0.0–0.2)

## 2024-08-08 LAB — BASIC METABOLIC PANEL WITH GFR
Anion gap: 10 (ref 5–15)
BUN: 19 mg/dL (ref 6–20)
CO2: 24 mmol/L (ref 22–32)
Calcium: 9.2 mg/dL (ref 8.9–10.3)
Chloride: 104 mmol/L (ref 98–111)
Creatinine, Ser: 1.34 mg/dL — ABNORMAL HIGH (ref 0.61–1.24)
GFR, Estimated: >60 mL/min (ref >60)
Glucose, Bld: 300 mg/dL — ABNORMAL HIGH (ref 70–99)
Potassium: 4.2 mmol/L (ref 3.5–5.1)
Sodium: 138 mmol/L (ref 135–145)

## 2024-08-08 LAB — HIV ANTIBODY (ROUTINE TESTING W REFLEX): HIV Screen 4th Generation wRfx: NONREACTIVE

## 2024-08-08 MED ORDER — IPRATROPIUM-ALBUTEROL 0.5-2.5 (3) MG/3ML IN SOLN
3.0000 mL | Freq: Four times a day (QID) | RESPIRATORY_TRACT | Status: DC
  Administered 2024-08-08: 3 mL via RESPIRATORY_TRACT
  Filled 2024-08-08: qty 3

## 2024-08-08 MED ORDER — IPRATROPIUM-ALBUTEROL 0.5-2.5 (3) MG/3ML IN SOLN
3.0000 mL | Freq: Two times a day (BID) | RESPIRATORY_TRACT | Status: DC
  Administered 2024-08-09 – 2024-08-10 (×3): 3 mL via RESPIRATORY_TRACT
  Filled 2024-08-08 (×4): qty 3

## 2024-08-08 MED ORDER — ORAL CARE MOUTH RINSE: 15.0000 mL | OROMUCOSAL | Status: DC | PRN

## 2024-08-08 MED ORDER — ALBUTEROL SULFATE (2.5 MG/3ML) 0.083% IN NEBU: 2.5000 mg | INHALATION_SOLUTION | Freq: Four times a day (QID) | RESPIRATORY_TRACT | Status: DC | PRN

## 2024-08-08 NOTE — Plan of Care (Signed)

## 2024-08-08 NOTE — Progress Notes (Signed)
 PROGRESS NOTE    Zachary Weeks  FMW:[id number] DOB: October 28, 1981 DOA: 08/07/2024 PCP: Toribio Jerel MATSU, MD   Brief Narrative: Zachary Weeks is a 43 y.o. male with a history of sarcoidosis, asthma, GERD, primary hypertension, OSA.  Patient presented secondary to shortness of breath and with concern for respiratory failure possibly secondary to sarcoidosis flare. Steroids started.   Assessment/Plan:  Acute respiratory failure with hypoxia Presumed secondary to flare of lung sarcoidosis.  -Wean oxygen as able -Ambulatory pulse ox twice daily  Sarcoidosis of the lung Noted. Patient was previously established with pulmonology but states he was supposed to get a new one but never did. Patient started on Solu-medrol  this admission. -Continue Solu-medrol   AKI Baseline creatinine of about 1.1. AKI presumed secondary to volume depletion. Patient started on IV fluids. Creatinine of 1.43 on admission and improved to 1.34 today. -Continue IV fluids  Hyperglycemia Noted on admission. Patient appears to have a history of at least prediabetes based on prior hemoglobin A1C of 6.6%. Hemoglobin A1C obtained this admission. -Follow-up hemoglobin A1C  OSA -Continue CPAP  GERD without esophagitis -Continue Protonix   Primary hypertension -Continue amlodipine  and metoprolol   Hyperlipidemia -Continue Lipitor    DVT prophylaxis: Lovenox  Code Status:   Code Status: Full Code Family Communication: None at bedside Disposition Plan: Discharge home likely in 2-4 days pending ability to wean oxygen if able   Consultants:  None  Procedures:  None  Antimicrobials: None    Subjective: Patient reports ongoing dyspnea which is unchanged from admission.  Objective: BP 136/87   Pulse (!) 102   Temp 98.1 F (36.7 C) (Oral)   Resp 20   Ht 5' 11 (1.803 m)   Wt (!) 140 kg   SpO2 93%   BMI 43.05 kg/m   Examination:  General exam: Appears calm and comfortable Respiratory system:  Diminished. Respiratory effort normal. Cardiovascular system: S1 & S2 heard, RRR. No murmurs. Gastrointestinal system: Abdomen is nondistended, soft and nontender. Normal bowel sounds heard. Central nervous system: Alert and oriented. No focal neurological deficits. Psychiatry: Judgement and insight appear normal. Mood & affect appropriate.    Data Reviewed: I have personally reviewed following labs and imaging studies   Last CBC Lab Results  Component Value Date   WBC 6.2 08/08/2024   HGB 16.7 08/08/2024   HCT 50.0 08/08/2024   MCV 89.0 08/08/2024   MCH 29.7 08/08/2024   RDW 13.2 08/08/2024   PLT 179 08/08/2024     Last metabolic panel Lab Results  Component Value Date   GLUCOSE 300 (H) 08/08/2024   NA 138 08/08/2024   K 4.2 08/08/2024   CL 104 08/08/2024   CO2 24 08/08/2024   BUN 19 08/08/2024   CREATININE 1.34 (H) 08/08/2024   GFRNONAA >60 08/08/2024   CALCIUM  9.2 08/08/2024   PROT 7.4 04/24/2024   ALBUMIN 3.7 04/24/2024   BILITOT 1.1 04/24/2024   ALKPHOS 64 04/24/2024   AST 22 04/24/2024   ALT 27 04/24/2024   ANIONGAP 10 08/08/2024     Creatinine Clearance: Estimated Creatinine Clearance: 99.6 mL/min (A) (by C-G formula based on SCr of 1.34 mg/dL (H)).  No results found for this or any previous visit (from the past 240 hours).    Radiology Studies: CT Angio Chest PE W and/or Wo Contrast Result Date: 08/07/2024 CLINICAL DATA:  Shortness of breath, hypoxia. History of sarcoidosis, diagnosed at mediastinoscopy with lymph node biopsy on August 2022. EXAM: CT ANGIOGRAPHY CHEST WITH CONTRAST TECHNIQUE: Multidetector CT imaging  of the chest was performed using the standard protocol during bolus administration of intravenous contrast. Multiplanar CT image reconstructions and MIPs were obtained to evaluate the vascular anatomy. RADIATION DOSE REDUCTION: This exam was performed according to the departmental dose-optimization program which includes automated exposure  control, adjustment of the mA and/or kV according to patient size and/or use of iterative reconstruction technique. CONTRAST:  OMNIPAQUE  IOHEXOL  350 MG/ML SOLN COMPARISON:  06/16/2021 FINDINGS: Cardiovascular: No filling defect is identified in the pulmonary arterial tree to suggest pulmonary embolus. Mediastinum/Nodes: Enlarged AP window, prevascular, paratracheal, hilar, and subcarinal lymph nodes are present. Index right hilar node 2.2 cm in short axis on image 43 series 4, formerly 1.9 cm. Index subcarinal node 3.1 cm in short axis on image 46 series 4, formerly 2.7 cm. Lungs/Pleura: Mild bilateral airway thickening. Scarring or atelectasis in both lower lobes in the lingula. Upper Abdomen: Unremarkable Musculoskeletal: Mid to lower thoracic spondylosis. Review of the MIP images confirms the above findings. IMPRESSION: 1. No filling defect is identified in the pulmonary arterial tree to suggest pulmonary embolus. 2. Enlarged mediastinal and hilar lymph nodes, mildly increased in size from 06/16/2021. Appearance compatible with known diagnosis of sarcoidosis. 3. Mild bilateral airway thickening. 4. Scarring or atelectasis in both lower lobes and the lingula. Electronically Signed   By: [name] M.D.   On: 08/07/2024 17:59   DG Chest 2 View Result Date: 08/07/2024 CLINICAL DATA:  Shortness of breath on awakening today. Hypoxemia. History of asthma. EXAM: CHEST - 2 VIEW COMPARISON:  Radiographs 06/09/2024 and 11/26/2023. FINDINGS: The heart size and mediastinal contours are stable. The lungs are clear. No pleural effusion or pneumothorax. Stable mild degenerative changes in the spine without evidence of acute osseous abnormality. IMPRESSION: Stable chest. No active cardiopulmonary process. Electronically Signed   By: [name] M.D.   On: 08/07/2024 14:26      LOS: 1 day    [name], MD [facility] 08/08/2024, 7:53 AM   If 7PM-7AM, please contact  night-coverage [email or web address]

## 2024-08-08 NOTE — ED Notes (Signed)
 Patient ambulated to the restroom

## 2024-08-08 NOTE — ED Notes (Signed)
 Patient ambulated to restroom.

## 2024-08-08 NOTE — Progress Notes (Signed)
   08/08/24 1014  TOC Brief Assessment  Insurance and Status Reviewed  Patient has primary care physician Yes  Home environment has been reviewed Single family home  Prior level of function: Independent  Prior/Current Home Services No current home services  Social Drivers of Health Review SDOH reviewed no interventions necessary  Readmission risk has been reviewed Yes  Transition of care needs transition of care needs identified, TOC will continue to follow   Pt currently requiring 4L O2 through [place]. Pt not on O2 at home. ICM will continue to follow for O2 need at discharge.

## 2024-08-08 NOTE — ED Notes (Signed)
Pt up to bathroom and back to room 

## 2024-08-08 NOTE — Plan of Care (Signed)

## 2024-08-08 NOTE — Assessment & Plan Note (Signed)
-   She has not been compliant with his CPAP for the last 3 nights. - This could be contributing to his hypoxia. - CPAP will be applied every night here.

## 2024-08-09 DIAGNOSIS — J9601 Acute respiratory failure with hypoxia: Secondary | ICD-10-CM | POA: Diagnosis not present

## 2024-08-09 LAB — HEMOGLOBIN A1C
Hgb A1c MFr Bld: 7.1 % — ABNORMAL HIGH (ref 4.8–5.6)
Mean Plasma Glucose: 157.07 mg/dL

## 2024-08-09 NOTE — Plan of Care (Signed)
  Problem: Education: Goal: Knowledge of General Education information will improve Description: Including pain rating scale, medication(s)/side effects and non-pharmacologic comfort measures Outcome: Progressing   Problem: Clinical Measurements: Goal: Respiratory complications will improve Outcome: Progressing Goal: Cardiovascular complication will be avoided Outcome: Progressing   Problem: Nutrition: Goal: Adequate nutrition will be maintained Outcome: Progressing   Problem: Coping: Goal: Level of anxiety will decrease Outcome: Progressing   Problem: Elimination: Goal: Will not experience complications related to bowel motility Outcome: Progressing   Problem: Pain Managment: Goal: General experience of comfort will improve and/or be controlled Outcome: Progressing   Problem: Safety: Goal: Ability to remain free from injury will improve Outcome: Progressing   Problem: Skin Integrity: Goal: Risk for impaired skin integrity will decrease Outcome: Progressing

## 2024-08-09 NOTE — Progress Notes (Signed)
 Mobility Specialist Progress Note:    08/09/24 1330  Mobility  Activity Ambulated with assistance  Level of Assistance Independent  Assistive Device None  Distance Ambulated (ft) 140 ft  Range of Motion/Exercises Active;All extremities  Activity Response Tolerated well  Mobility Referral Yes  Mobility visit 1 Mobility  Mobility Specialist Start Time (ACUTE ONLY) 1330  Mobility Specialist Stop Time (ACUTE ONLY) 1350  Mobility Specialist Time Calculation (min) (ACUTE ONLY) 20 min   Pt received in bed, agreeable to mobility. Independently able to stand and ambulate with no AD. Tolerated well, SpO2 93% on RA at rest. SpO2 88-93% on RA during ambulation, after a short rest break O2 was back at 93% on RA. Returned pt supine, all needs met.  Edwar Coe Mobility Specialist Please contact via Special educational needs teacher or  Rehab office at [phone]

## 2024-08-09 NOTE — Progress Notes (Signed)
 PROGRESS NOTE    Zachary Weeks  FMW:[id number] DOB: 06/25/1981 DOA: 08/07/2024 PCP: Toribio Jerel MATSU, MD   Brief Narrative:  43 y.o. male with a history of sarcoidosis, asthma, GERD, primary hypertension, OSA presented secondary to shortness of breath and with concern for respiratory failure possibly secondary to sarcoidosis flare.  He was started on IV Solu-Medrol .  Assessment & Plan:   Acute respiratory failure with hypoxia Sarcoidosis of the lung with possible flare -Currently still requiring 2 L oxygen by nasal cannula.  Wean off as able.  Continue Solu-Medrol  40 mg IV every 12 hours for today.  Continue current nebs.  Will need outpatient follow-up with pulmonary.  AKI - Baseline creatinine of 1.1.  Improving to 1.34 on 08/08/2024.  Off IV fluids.  No labs today.  Monitor.  Hyperglycemia - Possibly steroid mediated.  No recent A1c.  Check A1c in AM.  Continue CBGs with SSI.  Obesity class III -Outpatient follow-up  Hyperlipidemia Hypertension - Continue Lipitor .  Continue amlodipine  and metoprolol   GERD -Continue PPI    DVT prophylaxis: Lovenox  Code Status: Full Family Communication: None at bedside Disposition Plan:  Status is: Inpatient Remains inpatient appropriate because: Of severity of illness    Consultants: None  Procedures: None  Antimicrobials: None   Subjective: Patient seen and examined at bedside.  Feels slightly better but still short of breath with exertion.  Does not feel ready to go home today.  No fever, vomiting, chest pain or abdominal pain reported.  Objective: Vitals:   08/08/24 2050 08/08/24 2138 08/09/24 0510 08/09/24 0750  BP:  130/75 112/79   Pulse:  82 87   Resp:  19 16   Temp:  97.6 F (36.4 C) 97.7 F (36.5 C)   TempSrc:  Oral Oral   SpO2: 95% 94% 93% 100%  Weight:      Height:        Intake/Output Summary (Last 24 hours) at 08/09/2024 0902 Last data filed at 08/08/2024 2241 Gross per 24 hour  Intake 1015.52 ml   Output --  Net 1015.52 ml   Filed Weights   08/07/24 1359  Weight: (!) 140 kg    Examination:  General exam: Appears calm and comfortable. Respiratory system: Bilateral decreased breath sounds at bases with scattered crackles Cardiovascular system: S1 & S2 heard, Rate controlled Gastrointestinal system: Abdomen is morbidly obese, nondistended, soft and nontender. Normal bowel sounds heard. Extremities: No cyanosis, clubbing, edema  Central nervous system: Alert and awake.  No obvious focal neurological deficits. Moving extremities Skin: No rashes, lesions or ulcers Psychiatry: Affect is mostly flat.  Not agitated.    Data Reviewed: I have personally reviewed following labs and imaging studies  CBC: Recent Labs  Lab 08/07/24 1447 08/08/24 0533  WBC 8.4 6.2  HGB 16.5 16.7  HCT 47.7 50.0  MCV 87.2 89.0  PLT 196 179   Basic Metabolic Panel: Recent Labs  Lab 08/07/24 1447 08/08/24 0533  NA 137 138  K 3.5 4.2  CL 100 104  CO2 23 24  GLUCOSE 208* 300*  BUN 16 19  CREATININE 1.43* 1.34*  CALCIUM  9.0 9.2   GFR: Estimated Creatinine Clearance: 99.6 mL/min (A) (by C-G formula based on SCr of 1.34 mg/dL (H)). Liver Function Tests: No results for input(s): AST, ALT, ALKPHOS, BILITOT, PROT, ALBUMIN in the last 168 hours. No results for input(s): LIPASE, AMYLASE in the last 168 hours. No results for input(s): AMMONIA in the last 168 hours. Coagulation Profile: No results for  input(s): INR, PROTIME in the last 168 hours. Cardiac Enzymes: No results for input(s): CKTOTAL, CKMB, CKMBINDEX, TROPONINI in the last 168 hours. BNP (last 3 results) No results for input(s): PROBNP in the last 8760 hours. HbA1C: No results for input(s): HGBA1C in the last 72 hours. CBG: No results for input(s): GLUCAP in the last 168 hours. Lipid Profile: No results for input(s): CHOL, HDL, LDLCALC, TRIG, CHOLHDL, LDLDIRECT in the last 72  hours. Thyroid Function Tests: No results for input(s): TSH, T4TOTAL, FREET4, T3FREE, THYROIDAB in the last 72 hours. Anemia Panel: No results for input(s): VITAMINB12, FOLATE, FERRITIN, TIBC, IRON, RETICCTPCT in the last 72 hours. Sepsis Labs: No results for input(s): PROCALCITON, LATICACIDVEN in the last 168 hours.  No results found for this or any previous visit (from the past 240 hours).       Radiology Studies: CT Angio Chest PE W and/or Wo Contrast Result Date: 08/07/2024 CLINICAL DATA:  Shortness of breath, hypoxia. History of sarcoidosis, diagnosed at mediastinoscopy with lymph node biopsy on August 2022. EXAM: CT ANGIOGRAPHY CHEST WITH CONTRAST TECHNIQUE: Multidetector CT imaging of the chest was performed using the standard protocol during bolus administration of intravenous contrast. Multiplanar CT image reconstructions and MIPs were obtained to evaluate the vascular anatomy. RADIATION DOSE REDUCTION: This exam was performed according to the departmental dose-optimization program which includes automated exposure control, adjustment of the mA and/or kV according to patient size and/or use of iterative reconstruction technique. CONTRAST:  OMNIPAQUE  IOHEXOL  350 MG/ML SOLN COMPARISON:  06/16/2021 FINDINGS: Cardiovascular: No filling defect is identified in the pulmonary arterial tree to suggest pulmonary embolus. Mediastinum/Nodes: Enlarged AP window, prevascular, paratracheal, hilar, and subcarinal lymph nodes are present. Index right hilar node 2.2 cm in short axis on image 43 series 4, formerly 1.9 cm. Index subcarinal node 3.1 cm in short axis on image 46 series 4, formerly 2.7 cm. Lungs/Pleura: Mild bilateral airway thickening. Scarring or atelectasis in both lower lobes in the lingula. Upper Abdomen: Unremarkable Musculoskeletal: Mid to lower thoracic spondylosis. Review of the MIP images confirms the above findings. IMPRESSION: 1. No filling defect is  identified in the pulmonary arterial tree to suggest pulmonary embolus. 2. Enlarged mediastinal and hilar lymph nodes, mildly increased in size from 06/16/2021. Appearance compatible with known diagnosis of sarcoidosis. 3. Mild bilateral airway thickening. 4. Scarring or atelectasis in both lower lobes and the lingula. Electronically Signed   By: [name] M.D.   On: 08/07/2024 17:59   DG Chest 2 View Result Date: 08/07/2024 CLINICAL DATA:  Shortness of breath on awakening today. Hypoxemia. History of asthma. EXAM: CHEST - 2 VIEW COMPARISON:  Radiographs 06/09/2024 and 11/26/2023. FINDINGS: The heart size and mediastinal contours are stable. The lungs are clear. No pleural effusion or pneumothorax. Stable mild degenerative changes in the spine without evidence of acute osseous abnormality. IMPRESSION: Stable chest. No active cardiopulmonary process. Electronically Signed   By: [name] M.D.   On: 08/07/2024 14:26        Scheduled Meds:  amLODipine   5 mg Oral Daily   atorvastatin   80 mg Oral Daily   cholecalciferol   1,000 Units Oral q AM   enoxaparin  (LOVENOX ) injection  70 mg Subcutaneous Q24H   ipratropium-albuterol   3 mL Nebulization BID   methylPREDNISolone  (SOLU-MEDROL ) injection  40 mg Intravenous Q12H   metoprolol  tartrate  50 mg Oral BID   pantoprazole   40 mg Oral BID   Continuous Infusions:        Sophie Mao, MD  [facility] 08/09/2024, 9:02 AM

## 2024-08-10 ENCOUNTER — Other Ambulatory Visit (HOSPITAL_COMMUNITY): Payer: Self-pay

## 2024-08-10 LAB — BASIC METABOLIC PANEL WITH GFR
Anion gap: 12 (ref 5–15)
BUN: 23 mg/dL — ABNORMAL HIGH (ref 6–20)
CO2: 25 mmol/L (ref 22–32)
Calcium: 8.9 mg/dL (ref 8.9–10.3)
Chloride: 100 mmol/L (ref 98–111)
Creatinine, Ser: 1.23 mg/dL (ref 0.61–1.24)
GFR, Estimated: >60 mL/min (ref >60)
Glucose, Bld: 326 mg/dL — ABNORMAL HIGH (ref 70–99)
Potassium: 4.2 mmol/L (ref 3.5–5.1)
Sodium: 137 mmol/L (ref 135–145)

## 2024-08-10 LAB — MAGNESIUM: Magnesium: 2.3 mg/dL (ref 1.7–2.4)

## 2024-08-10 MED ORDER — INSULIN ASPART 100 UNIT/ML IJ SOLN: 0.0000 [IU] | Freq: Three times a day (TID) | INTRAMUSCULAR | Status: DC

## 2024-08-10 MED ORDER — PREDNISONE 20 MG PO TABS: 40.0000 mg | ORAL_TABLET | Freq: Every day | ORAL | 0 refills | Status: AC

## 2024-08-10 MED ORDER — INSULIN ASPART 100 UNIT/ML IJ SOLN: 0.0000 [IU] | Freq: Every day | INTRAMUSCULAR | Status: DC

## 2024-08-10 NOTE — Progress Notes (Signed)
 Did well on O2 walk test, staying above 92% while ambulating.  IV removed and DC instructions reviewed, to follow up with primary MD.  Scripts sent to pharmacy.  Drove self to ED and will drive self back home.  Nurse tech walking patient to ED.

## 2024-08-10 NOTE — Inpatient Diabetes Management (Addendum)
 Inpatient Diabetes Program Recommendations  AACE/ADA: New Consensus Statement on Inpatient Glycemic Control   Target Ranges:  Prepandial:   less than 140 mg/dL      Peak postprandial:   less than 180 mg/dL (1-2 hours)      Critically ill patients:  140 - 180 mg/dL     Latest Reference Range & Units 05/05/21 00:11 07/09/21 13:29 08/08/24 05:33  Hemoglobin A1C 4.8 - 5.6 % 6.6 (H) 6.3 (H) 7.1 (H)    Latest Reference Range & Units 08/07/24 14:47 08/08/24 05:33 08/10/24 05:00  Glucose 70 - 99 mg/dL 791 (H) 699 (H) 673 (H)   Review of Glycemic Control  Diabetes history: DM2 Outpatient Diabetes medications: None; pt had been taking Metformin , Ozempic, and Jardiance 25 mg daily but GI provider advised him to stop all DM meds due to GI issues Current orders for Inpatient glycemic control: None; Solumedrol 40 mg Q12H  Inpatient Diabetes Program Recommendations:    Insulin : Patient has prediabetes hx and is currently ordered steroids. Lab glucose 326 mg/dl this morning.  Please consider ordering CBGs AC&HS, Novolog  0-15 units TID with meals and Novolog  0-5 units at bedtime.  HbgA1C:  A1C 7.1% on 08/08/24 indicating an average glucose of 157 mg/dl over the past 2-3 months.  Diet: May want to add Carb Modified to heart healthy diet.  Addendum 08/10/24@9 :14-Spoke with patient over the phone regarding DM. Patient states he has DM and he had been taking Ozempic, Metformin , and Jardiance but he started having GI issues and his GI provider advised him to stop all DM medications.  Discussed how Jardiance works and explained that it likely was not contributing to GI issues. Patient denies any UTI or yeast infections while on Jardiance. Patient has a glucometer at home for glucose monitoring. Discussed A1C of 7.1% and glucose of 326 mg/dl this morning which is likely due to Solumedrol he was given. Patient reports that the discharging provider is planning to discharge him on Prednisone . Discussed that the  steroids will likely contribute to hyperglycemia. Patient states he plans to call PCP and get a follow up appointment for next week. Encouraged patient to check CBGs at home and to reach out to PCP if glucose is staying consistently over 200 mg/dl. Informed patient that I would add the Jardiance to home med list and let Dr. Cheryle know that he had been on Metformin , Ozempic, and Jardiance for DM but they were stopped as he may need to resume at least the [place]. Patient reports he is scheduled to see GI provider in October. Asked patient to let his PCP know that GI had stopped DM medications and be sure to take glucometer or glucose log to appointment with PCP.   Thanks, Earnie Gainer, RN, MSN, CDCES Diabetes Coordinator Inpatient Diabetes Program [phone] (Team Pager from 8am to 5pm)

## 2024-08-10 NOTE — Discharge Summary (Signed)
 Physician Discharge Summary  Zachary Weeks FMW:[id number] DOB: 09-20-1981 DOA: 08/07/2024  PCP: Toribio Jerel MATSU, MD  Admit date: 08/07/2024 Discharge date: 08/10/2024  Admitted From: Home Disposition: Home  Recommendations for Outpatient Follow-up:  Follow up with PCP in 1 week  Outpatient evaluation and follow-up with pulmonary Follow up in ED if symptoms worsen or new appear   Home Health: No Equipment/Devices: None  Discharge Condition: Stable CODE STATUS: Full Diet recommendation: Heart healthy/carb modified  Brief/Interim Summary: 43 y.o. male with a history of sarcoidosis, asthma, GERD, primary hypertension, OSA presented secondary to shortness of breath and with concern for respiratory failure possibly secondary to sarcoidosis flare.  He was started on IV Solu-Medrol .  Subsequently, his condition is improved.  He is currently on room air.  He feels okay to go home today.  He will be discharged home on oral prednisone  today.  Outpatient follow-up with PCP.  Will need outpatient evaluation and follow-up with pulmonary.  Discharge Diagnoses:   Acute respiratory failure with hypoxia Sarcoidosis of the lung with possible flare - Treated with IV Solu-Medrol  and required supplemental oxygen during the hospitalization.  Subsequently, his condition is improved.  He is currently on room air.  He feels okay to go home today.  He will be discharged home today on oral prednisone  40 mg daily for 7 days.  Outpatient follow-up with PCP.  Will need outpatient evaluation and follow-up with pulmonary.   AKI - Baseline creatinine of 1.1.  Improving to 1.23 today.  Outpatient follow-up.  Diabetes mellitus type 2 uncontrolled with hyperglycemia - A1c 7.1.  Carb modified diet.  Resume Jardiance.  Outpatient follow-up with PCP.    Obesity class III -Outpatient follow-up   Hyperlipidemia Hypertension - Continue Lipitor .  Continue amlodipine  and metoprolol    GERD -Continue PPI     Discharge  Instructions  Discharge Instructions     Diet - low sodium heart healthy   Complete by: As directed    Diet Carb Modified   Complete by: As directed    Increase activity slowly   Complete by: As directed    Pulmonary Visit   Complete by: As directed    sarcoidosis   Reason for referral: Other Pulmonary      Allergies as of 08/10/2024   No Known Allergies      Medication List     TAKE these medications    albuterol  108 (90 Base) MCG/ACT inhaler Commonly known as: VENTOLIN  HFA Inhale 2-3 puffs into the lungs every 6 (six) hours as needed for wheezing or shortness of breath.   amLODipine  5 MG tablet Commonly known as: NORVASC  TAKE 1 TABLET BY MOUTH DAILY   atorvastatin  80 MG tablet Commonly known as: LIPITOR  Take 1 tablet (80 mg total) by mouth daily.   cyclobenzaprine  10 MG tablet Commonly known as: FLEXERIL  Take 10 mg by mouth daily as needed for muscle spasms.   Jardiance 25 MG Tabs tablet Generic drug: empagliflozin Take 25 mg by mouth daily. Pt reports GI provider told him to stop all DM meds due to GI issues he was having.   metoprolol  tartrate 50 MG tablet Commonly known as: LOPRESSOR  TAKE 1 TABLET BY MOUTH TWICE DAILY   pantoprazole  40 MG tablet Commonly known as: PROTONIX  TAKE 1 TABLET BY MOUTH TWICE DAILY   predniSONE  20 MG tablet Commonly known as: DELTASONE  Take 2 tablets (40 mg total) by mouth daily with breakfast for 7 days. Start taking on: August 11, 2024   Vitamin D3  1000 units Caps Take 1,000 Units by mouth in the morning.        No Known Allergies  Consultations: None   Procedures/Studies: CT Angio Chest PE W and/or Wo Contrast Result Date: 08/07/2024 CLINICAL DATA:  Shortness of breath, hypoxia. History of sarcoidosis, diagnosed at mediastinoscopy with lymph node biopsy on August 2022. EXAM: CT ANGIOGRAPHY CHEST WITH CONTRAST TECHNIQUE: Multidetector CT imaging of the chest was performed using the standard protocol during  bolus administration of intravenous contrast. Multiplanar CT image reconstructions and MIPs were obtained to evaluate the vascular anatomy. RADIATION DOSE REDUCTION: This exam was performed according to the departmental dose-optimization program which includes automated exposure control, adjustment of the mA and/or kV according to patient size and/or use of iterative reconstruction technique. CONTRAST:  OMNIPAQUE  IOHEXOL  350 MG/ML SOLN COMPARISON:  06/16/2021 FINDINGS: Cardiovascular: No filling defect is identified in the pulmonary arterial tree to suggest pulmonary embolus. Mediastinum/Nodes: Enlarged AP window, prevascular, paratracheal, hilar, and subcarinal lymph nodes are present. Index right hilar node 2.2 cm in short axis on image 43 series 4, formerly 1.9 cm. Index subcarinal node 3.1 cm in short axis on image 46 series 4, formerly 2.7 cm. Lungs/Pleura: Mild bilateral airway thickening. Scarring or atelectasis in both lower lobes in the lingula. Upper Abdomen: Unremarkable Musculoskeletal: Mid to lower thoracic spondylosis. Review of the MIP images confirms the above findings. IMPRESSION: 1. No filling defect is identified in the pulmonary arterial tree to suggest pulmonary embolus. 2. Enlarged mediastinal and hilar lymph nodes, mildly increased in size from 06/16/2021. Appearance compatible with known diagnosis of sarcoidosis. 3. Mild bilateral airway thickening. 4. Scarring or atelectasis in both lower lobes and the lingula. Electronically Signed   By: [name] M.D.   On: 08/07/2024 17:59   DG Chest 2 View Result Date: 08/07/2024 CLINICAL DATA:  Shortness of breath on awakening today. Hypoxemia. History of asthma. EXAM: CHEST - 2 VIEW COMPARISON:  Radiographs 06/09/2024 and 11/26/2023. FINDINGS: The heart size and mediastinal contours are stable. The lungs are clear. No pleural effusion or pneumothorax. Stable mild degenerative changes in the spine without evidence of acute osseous  abnormality. IMPRESSION: Stable chest. No active cardiopulmonary process. Electronically Signed   By: [name] M.D.   On: 08/07/2024 14:26      Subjective: Patient seen and examined at bedside.  Feels better and feels able to go home today.  No fever, vomiting, abdominal pain reported.  Discharge Exam: Vitals:   08/10/24 0900 08/10/24 0917  BP:    Pulse:    Resp:    Temp:    SpO2: 96% 94%    General: Pt is alert, awake, not in acute distress.  On room air currently. Cardiovascular: rate controlled, S1/S2 + Respiratory: bilateral decreased breath sounds at bases with scattered crackles Abdominal: Soft, morbidly obese, NT, ND, bowel sounds + Extremities: no edema, no cyanosis    The results of significant diagnostics from this hospitalization (including imaging, microbiology, ancillary and laboratory) are listed below for reference.     Microbiology: No results found for this or any previous visit (from the past 240 hours).   Labs: BNP (last 3 results) Recent Labs    08/07/24 1447  BNP 13.0   Basic Metabolic Panel: Recent Labs  Lab 08/07/24 1447 08/08/24 0533 08/10/24 0500  NA 137 138 137  K 3.5 4.2 4.2  CL 100 104 100  CO2 23 24 25   GLUCOSE 208* 300* 326*  BUN 16 19 23*  CREATININE 1.43*  1.34* 1.23  CALCIUM  9.0 9.2 8.9  MG  --   --  2.3   Liver Function Tests: No results for input(s): AST, ALT, ALKPHOS, BILITOT, PROT, ALBUMIN in the last 168 hours. No results for input(s): LIPASE, AMYLASE in the last 168 hours. No results for input(s): AMMONIA in the last 168 hours. CBC: Recent Labs  Lab 08/07/24 1447 08/08/24 0533  WBC 8.4 6.2  HGB 16.5 16.7  HCT 47.7 50.0  MCV 87.2 89.0  PLT 196 179   Cardiac Enzymes: No results for input(s): CKTOTAL, CKMB, CKMBINDEX, TROPONINI in the last 168 hours. BNP: Invalid input(s): POCBNP CBG: No results for input(s): GLUCAP in the last 168 hours. D-Dimer No results for  input(s): DDIMER in the last 72 hours. Hgb A1c Recent Labs    08/08/24 0533  HGBA1C 7.1*   Lipid Profile No results for input(s): CHOL, HDL, LDLCALC, TRIG, CHOLHDL, LDLDIRECT in the last 72 hours. Thyroid function studies No results for input(s): TSH, T4TOTAL, T3FREE, THYROIDAB in the last 72 hours.  Invalid input(s): FREET3 Anemia work up No results for input(s): VITAMINB12, FOLATE, FERRITIN, TIBC, IRON, RETICCTPCT in the last 72 hours. Urinalysis    Component Value Date/Time   COLORURINE AMBER (A) 04/24/2024 0924   APPEARANCEUR CLEAR 04/24/2024 0924   APPEARANCEUR Clear 01/23/2021 0914   LABSPEC 1.025 04/24/2024 0924   PHURINE 5.0 04/24/2024 0924   GLUCOSEU 50 (A) 04/24/2024 0924   HGBUR NEGATIVE 04/24/2024 0924   BILIRUBINUR NEGATIVE 04/24/2024 0924   BILIRUBINUR Negative 01/23/2021 0914   KETONESUR NEGATIVE 04/24/2024 0924   PROTEINUR 30 (A) 04/24/2024 0924   NITRITE NEGATIVE 04/24/2024 0924   LEUKOCYTESUR NEGATIVE 04/24/2024 0924   Sepsis Labs Recent Labs  Lab 08/07/24 1447 08/08/24 0533  WBC 8.4 6.2   Microbiology No results found for this or any previous visit (from the past 240 hours).   Time coordinating discharge: 35 minutes  SIGNED:   Sophie Mao, MD  [facility] 08/10/2024, 9:35 AM

## 2024-08-11 ENCOUNTER — Telehealth: Payer: Self-pay | Admitting: Pulmonary Disease

## 2024-08-11 NOTE — Telephone Encounter (Signed)
 Patient wishes to transfer care to our [place] location since it is closer to home

## 2024-08-11 NOTE — Telephone Encounter (Signed)
 Happy to see patient if Dr. Jude is OK with that.

## 2024-08-16 ENCOUNTER — Encounter: Payer: Self-pay | Admitting: Gastroenterology

## 2024-08-16 ENCOUNTER — Ambulatory Visit (INDEPENDENT_AMBULATORY_CARE_PROVIDER_SITE_OTHER): Admitting: Gastroenterology

## 2024-08-16 VITALS — BP 118/78 | HR 79 | Temp 97.2°F | Ht 71.0 in | Wt 320.2 lb

## 2024-08-16 DIAGNOSIS — K219 Gastro-esophageal reflux disease without esophagitis: Secondary | ICD-10-CM | POA: Diagnosis not present

## 2024-08-16 DIAGNOSIS — R194 Change in bowel habit: Secondary | ICD-10-CM | POA: Diagnosis not present

## 2024-08-16 DIAGNOSIS — Z860101 Personal history of adenomatous and serrated colon polyps: Secondary | ICD-10-CM | POA: Diagnosis not present

## 2024-08-16 NOTE — Progress Notes (Signed)
 Gastroenterology Office Note     Primary Care Physician:  Toribio Jerel MATSU, MD  Primary Gastroenterologist: Dr. Cindie   Chief Complaint   Chief Complaint  Patient presents with   Follow-up    Pt arrives for follow up. Pt is wanting to know if he start back on diabetic meds. No longer having any stool issues. Still taking Pantoprazole . Recent ED visit      History of Present Illness   Zachary Weeks is a 43 y.o. male presenting today with a history of chronic GERD, H.pylori gastritis s/p documented eradication, numerous adenomatous and sessile serrated polyps in 2023 with surveillance due in 2026. He was seen in June 2025 with change in bowel habits described as more frequent stools, suspected to have had an acute gastroenteritis and dealing with post-infectious IBS. Labs ordered to include: alpha gal panel positive for low/marginal response to pork and lamb, low to moderate tresponse to beef, negative celiac, inflammatory markers normal. Started on dicyclomine .   Returns today with resolution of symptoms. BM once to twice a day now. Back to baseline. Eats more chicken than beef. Trying to stay away from pork, lamb, beef. No longer needing to take dicyclomine . No overt GI bleeding. Declining epi pen. States he has no symptomatic reactions if inadvertently eating beef. Recently hospitalized for sarcoidosis flare.   Has to resume jardiance and metformin  per PCP.   Pantoprazole  BID. Weaning off steroids with tomorrow last day. No dysphagia.    Past Medical History:  Diagnosis Date   Asthma    DVT (deep venous thrombosis) (HCC)    Dyspnea    at times - no known reason.    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   Myocardial infarction [facility])    Pneumonia    9- 77 years of age   Pre-diabetes    Sarcoidosis    Sleep apnea    CPAP @ [facility]    Past Surgical History:  Procedure Laterality  Date   BIOPSY  10/08/2022   Procedure: BIOPSY;  Surgeon: Cindie Carlin POUR, DO;  Location: [facility];  Service: Endoscopy;;   BRONCHIAL NEEDLE ASPIRATION BIOPSY  11/01/2020   Procedure: BRONCHIAL NEEDLE ASPIRATION BIOPSIES;  Surgeon: Jude Harden GAILS, MD;  Location: [facility] ENDOSCOPY;  Service: Cardiopulmonary;;   BRONCHIAL WASHINGS  11/01/2020   Procedure: BRONCHIAL WASHINGS;  Surgeon: Jude Harden GAILS, MD;  Location: [facility] ENDOSCOPY;  Service: Cardiopulmonary;;   CARDIAC CATHETERIZATION     COLONOSCOPY WITH PROPOFOL  N/A 10/08/2022   Procedure: COLONOSCOPY WITH PROPOFOL ;  Surgeon: Cindie Carlin POUR, DO;  Location: [facility];  Service: Endoscopy;  Laterality: N/A;  11:30 AM   CORONARY ULTRASOUND/IVUS N/A 05/05/2021   Procedure: Intravascular Ultrasound/IVUS;  Surgeon: Elmira Newman PARAS, MD;  Location: [facility] INVASIVE CV LAB;  Service: Cardiovascular;  Laterality: N/A;   ESOPHAGOGASTRODUODENOSCOPY (EGD) WITH PROPOFOL  N/A 10/08/2022   Procedure: ESOPHAGOGASTRODUODENOSCOPY (EGD) WITH PROPOFOL ;  Surgeon: Cindie Carlin POUR, DO;  Location: [facility];  Service: Endoscopy;  Laterality: N/A;   HEMOSTASIS CLIP PLACEMENT  10/08/2022   Procedure: HEMOSTASIS CLIP PLACEMENT;  Surgeon: Cindie Carlin POUR, DO;  [facility]: [facility];  Service: Endoscopy;;   LEFT HEART CATH AND CORONARY ANGIOGRAPHY N/A 05/05/2021   Procedure: LEFT HEART CATH AND CORONARY ANGIOGRAPHY;  Surgeon: Elmira Newman PARAS, MD;  Location: [facility] INVASIVE CV LAB;  Service: Cardiovascular;  Laterality: N/A;  MEDIASTINOSCOPY N/A 07/11/2021   Procedure: MEDIASTINOSCOPY;  Surgeon: Kerrin Elspeth BROCKS, MD;  Location: [facility] OR;  Service: Thoracic;  Laterality: N/A;   POLYPECTOMY  10/08/2022   Procedure: POLYPECTOMY;  Surgeon: Cindie Carlin POUR, DO;  [facility];  Service: Endoscopy;;   SUBMUCOSAL LIFTING INJECTION  10/08/2022   Procedure: SUBMUCOSAL LIFTING INJECTION;  Surgeon: Cindie Carlin POUR, DO;  [facility];   Service: Endoscopy;;   SUBMUCOSAL TATTOO INJECTION  10/08/2022   Procedure: SUBMUCOSAL TATTOO INJECTION;  Surgeon: Cindie Carlin POUR, DO;  [facility]: [facility];  Service: Endoscopy;;   VIDEO BRONCHOSCOPY N/A 11/01/2020   Procedure: VIDEO BRONCHOSCOPY WITH ENDOBRONCHIAL ULTRASOUND;  Surgeon: Jude Harden GAILS, MD;  Location: [facility] ENDOSCOPY;  Service: Cardiopulmonary;  Laterality: N/A;    Current Outpatient Medications  Medication Sig Dispense Refill   albuterol  (VENTOLIN  HFA) 108 (90 Base) MCG/ACT inhaler Inhale 2-3 puffs into the lungs every 6 (six) hours as needed for wheezing or shortness of breath.     amLODipine  (NORVASC ) 5 MG tablet TAKE 1 TABLET BY MOUTH DAILY 30 tablet 0   atorvastatin  (LIPITOR ) 80 MG tablet Take 1 tablet (80 mg total) by mouth daily. 90 tablet 3   Cholecalciferol  (VITAMIN D3) 1000 units CAPS Take 1,000 Units by mouth in the morning.     cyclobenzaprine  (FLEXERIL ) 10 MG tablet Take 10 mg by mouth daily as needed for muscle spasms.     metoprolol  tartrate (LOPRESSOR ) 50 MG tablet TAKE 1 TABLET BY MOUTH TWICE DAILY 60 tablet 0   pantoprazole  (PROTONIX ) 40 MG tablet TAKE 1 TABLET BY MOUTH TWICE DAILY 180 tablet 3   predniSONE  (DELTASONE ) 20 MG tablet Take 2 tablets (40 mg total) by mouth daily with breakfast for 7 days. 14 tablet 0   empagliflozin (JARDIANCE) 25 MG TABS tablet Take 25 mg by mouth daily. Pt reports GI provider told him to stop all DM meds due to GI issues he was having. (Patient not taking: Reported on 08/16/2024)     No current facility-administered medications for this visit.    Allergies as of 08/16/2024   (No Known Allergies)    Family History  Problem Relation Age of Onset   Pulmonary fibrosis Mother    Cancer Mother        cervical   Cancer Father        prostate   Hypertension Father    Lupus Maternal Grandmother    Other Maternal Grandmother        clotting disorder   Fibromyalgia Maternal Aunt    Other Other        clotting disorder    Colon cancer Neg Hx     Social History   Socioeconomic History   Marital status: Legally Separated    Spouse name: Not on file   Number of children: 2   Years of education: Not on file   Highest education level: High school graduate  Occupational History   Not on file  Tobacco Use   Smoking status: Former    Current packs/day: 0.00    Average packs/day: 1 pack/day for 3.0 years (3.0 ttl pk-yrs)    Types: Cigarettes    Start date: 53    Quit date: 1999    Years since quitting: 26.7    Passive exposure: Current   Smokeless tobacco: Current    Types: Chew, Snuff  Vaping Use   Vaping status: Never Used  Substance and Sexual Activity   Alcohol use: Yes  Comment: rarely- maybe 2 mixed drinks in a year   Drug use: Never   Sexual activity: Not on file  Other Topics Concern   Not on file  Social History Narrative   Lives with family   Caffeine- occass soda   Social Drivers of Health   Financial Resource Strain: Low Risk  (02/21/2020)   Received from [facility]   Overall Financial Resource Strain (CARDIA)    Difficulty of Paying Living Expenses: Not hard at all  Food Insecurity: No Food Insecurity (08/08/2024)   Hunger Vital Sign    Worried About Running Out of Food in the Last Year: Never true    Ran Out of Food in the Last Year: Never true  Transportation Needs: No Transportation Needs (08/08/2024)   PRAPARE - Administrator, Civil Service (Medical): No    Lack of Transportation (Non-Medical): No  Physical Activity: Inactive (02/21/2020)   Received from [facility]   Exercise Vital Sign    On average, how many days per week do you engage in moderate to strenuous exercise (like a brisk walk)?: 0 days    On average, how many minutes do you engage in exercise at this level?: 0 min  Stress: No Stress Concern Present (02/21/2020)   Received from [facility] - Occupational Stress Questionnaire    Feeling of  Stress : Not at all  Social Connections: Unknown (08/08/2024)   Social Connection and Isolation Panel    Frequency of Communication with Friends and Family: Not on file    Frequency of Social Gatherings with Friends and Family: Not on file    Attends Religious Services: Not on file    Active Member of Clubs or Organizations: Not on file    Attends Banker Meetings: Not on file    Marital Status: Widowed  Intimate Partner Violence: Not At Risk (08/08/2024)   Humiliation, Afraid, Rape, and Kick questionnaire    Fear of Current or Ex-Partner: No    Emotionally Abused: No    Physically Abused: No    Sexually Abused: No     Review of Systems   Gen: Denies any fever, chills, fatigue, weight loss, lack of appetite.  CV: Denies chest pain, heart palpitations, peripheral edema, syncope.  Resp: Denies shortness of breath at rest or with exertion. Denies wheezing or cough.  GI: Denies dysphagia or odynophagia. Denies jaundice, hematemesis, fecal incontinence. GU : Denies urinary burning, urinary frequency, urinary hesitancy MS: Denies joint pain, muscle weakness, cramps, or limitation of movement.  Derm: Denies rash, itching, dry skin Psych: Denies depression, anxiety, memory loss, and confusion Heme: Denies bruising, bleeding, and enlarged lymph nodes.   Physical Exam   BP 118/78   Pulse 79   Temp (!) 97.2 F (36.2 C)   Ht 5' 11 (1.803 m)   Wt (!) 320 lb 3.2 oz (145.2 kg)   BMI 44.66 kg/m  General:   Alert and oriented. Pleasant and cooperative. Well-nourished and well-developed.  Head:  Normocephalic and atraumatic. Eyes:  Without icterus Abdomen:  +BS, soft, non-tender and non-distended. No HSM noted. No guarding or rebound. No masses appreciated.  Rectal:  Deferred  Msk:  Symmetrical without gross deformities. Normal posture. Extremities:  Without edema. Neurologic:  Alert and  oriented x4;  grossly normal neurologically. Skin:  Intact without significant lesions  or rashes. Psych:  Alert and cooperative. Normal mood and affect.   Assessment  TAD FANCHER is a 43 y.o. male presenting today with a history of chronic GERD, H.pylori gastritis s/p documented eradication, numerous adenomatous and sessile serrated polyps in 2023 with surveillance due in 2026, last seen in June 2025 with change in bowel habits.  Presentation was most consistent with post-infectious IBS after bout with acute gastroenteritis. Now back to baseline and doing well. Evaluation noted as above.   GERD: doing well with pantoprazole  BID. No alarm signs/symptoms.  Numerous polyps in 2023: due for surveillance in 2026.   PLAN   Continue PPI BID Call if any recurrence Patient declining epi pen for hx of alpha gal allergy: has low/equivocal response to pork and lamb, low to moderate tresponse to beef, avoiding this Colonoscopy in 2026 Return in 1 year   Therisa MICAEL Stager, PhD, [facility] Gastroenterology

## 2024-08-16 NOTE — Patient Instructions (Signed)
 I am glad you are better!  Please call with any concerns in the meantime or any recurrence of diarrhea or any blood in  stool.  We will see you back in 1 year and arrange colonoscopy at that time!  I enjoyed seeing you again today! I value our relationship and want to provide genuine, compassionate, and quality care. You may receive a survey regarding your visit with me, and I welcome your feedback! Thanks so much for taking the time to complete this. I look forward to seeing you again.      Therisa MICAEL Stager, PhD, ANP-BC [facility] Gastroenterology

## 2024-08-31 ENCOUNTER — Ambulatory Visit (HOSPITAL_COMMUNITY)

## 2024-09-21 ENCOUNTER — Ambulatory Visit: Payer: Managed Care, Other (non HMO) | Admitting: Urology

## 2024-09-22 ENCOUNTER — Encounter: Admitting: Pulmonary Disease

## 2024-09-22 NOTE — Progress Notes (Deleted)
   Established Patient Pulmonology Office Visit   Subjective:  Patient ID: Zachary Weeks, male    DOB: [birth year]  MRN: [id number]  CC: No chief complaint on file.   HPI  Mr. Sellman is a 43 y/o  M with a PMH significant for Sarcoidosis and OSA who presents for follow up.  The patient was diagnosed with Sarcoidosis in 2022 after a mediastinoscopy   {PULM QUESTIONNAIRES (Optional):33196}  ROS  {History (Optional):23778}  Current Outpatient Medications:    albuterol  (VENTOLIN  HFA) 108 (90 Base) MCG/ACT inhaler, Inhale 2-3 puffs into the lungs every 6 (six) hours as needed for wheezing or shortness of breath., Disp: , Rfl:    amLODipine  (NORVASC ) 5 MG tablet, TAKE 1 TABLET BY MOUTH DAILY, Disp: 30 tablet, Rfl: 0   atorvastatin  (LIPITOR ) 80 MG tablet, Take 1 tablet (80 mg total) by mouth daily., Disp: 90 tablet, Rfl: 3   Cholecalciferol  (VITAMIN D3) 1000 units CAPS, Take 1,000 Units by mouth in the morning., Disp: , Rfl:    cyclobenzaprine  (FLEXERIL ) 10 MG tablet, Take 10 mg by mouth daily as needed for muscle spasms., Disp: , Rfl:    empagliflozin (JARDIANCE) 25 MG TABS tablet, Take 25 mg by mouth daily. Pt reports GI provider told him to stop all DM meds due to GI issues he was having. (Patient not taking: Reported on 08/16/2024), Disp: , Rfl:    metoprolol  tartrate (LOPRESSOR ) 50 MG tablet, TAKE 1 TABLET BY MOUTH TWICE DAILY, Disp: 60 tablet, Rfl: 0   pantoprazole  (PROTONIX ) 40 MG tablet, TAKE 1 TABLET BY MOUTH TWICE DAILY, Disp: 180 tablet, Rfl: 3      Objective:  There were no vitals taken for this visit. {Pulm Vitals (Optional):32837}  Physical Exam   Diagnostic Review:  {Labs (Optional):32838}  CTA 07/2024: IMPRESSION: 1. No filling defect is identified in the pulmonary arterial tree to suggest pulmonary embolus. 2. Enlarged mediastinal and hilar lymph nodes, mildly increased in size from 06/16/2021. Appearance compatible with known diagnosis of sarcoidosis. 3. Mild  bilateral airway thickening. 4. Scarring or atelectasis in both lower lobes and the lingula.  PFT 06/2021: normal  Pathology 2022:  SURGICAL PATHOLOGY **** THIS IS AN ADDENDUM REPORT **** CASE: [id number] PATIENT: Zachary Weeks Surgical Pathology Report **********Addendum **********  Reason for Addendum #1:  Additional special stains  Clinical History: mediastinal adenopathy (cm)    FINAL MICROSCOPIC DIAGNOSIS: A. LYMPH NODE, 4R, BIOPSY: - Non-necrotizing granulomatous. - See comment.  B. LYMPH NODE, 4R #2, BIOPSY: - Non-necrotizing granulomas. - See comment.  C. LYMPH NODE, MEDIASTINAL, BIOPSY: - Non-necrotizing granuloma. - See comment.  COMMENT: The differential includes sarcoid and infectious granulomatous inflammation. ADDENDUM:  No acid-fast bacilli are identified with AFB stain and no fungi are identified with PAS or GMS stains.       Assessment & Plan:   Assessment & Plan   No orders of the defined types were placed in this encounter.     No follow-ups on file.   Samatha Anspach, MD

## 2024-10-06 ENCOUNTER — Other Ambulatory Visit: Payer: Self-pay | Admitting: Cardiology

## 2024-10-12 ENCOUNTER — Other Ambulatory Visit: Payer: Self-pay | Admitting: Cardiology

## 2024-11-28 NOTE — Progress Notes (Unsigned)
 "  Established Patient Pulmonology Office Visit   Subjective:  Patient ID: Zachary Weeks, male    DOB: 12-Aug-1981  MRN: [id number]  CC: No chief complaint on file.   HPI  44  yo obese Man  for FU of mediastinal and hilar lymphadenopathy (first noted 10/2019, found to have noncaseating granulomas on mediastinoscopy), RLE DVT, & OSA who presents for follow up.        Family history of pulmonary fibrosis in his mother  {PULM QUESTIONNAIRES (Optional):33196}  ROS  {History (Optional):23778} Current Medications[1]      Objective:  There were no vitals taken for this visit. {Pulm Vitals (Optional):32837}  Physical Exam   Diagnostic Review:  {Labs (Optional):32838}  PFTs 06/2021 >> FVC 80%, no airway obs, TLC 84% , DLCO nml   HST 11/2020 [facility] severe OSA  TBNA 10/2020 neg, scant lymphoid tissue   10/18/20 Ambulatory saturation-oxygen saturation was 92% on room air at rest and desaturated 91% on walking    04/2021 CT angiogram chest with diffuse nonspecific mediastinal and hilar adenopathy, minimal scarring in the right middle lobe, dependent atelectasis focal subpleural opacity in the right lower lobe likely additional atelectasis or scarring   10/2019 CT angio chest >> Mild mediastinal and bilateral hilar adenopathy, Prevascular lymph node has a short axis diameter of 14 mm. Subcarinal lymph node has a short axis diameter of 19 mm. Right paratracheal lymph node has a short axis diameter of 12 mm. No axillary adenopathy. 5 mm right lower lobe peripheral nodule   PET 04/2020 >> Mediastinal and bilateral hilar lymphadenopathy with associated increased metabolic activity, favored to represent benign, reactive changes.   CT chest W con 08/2020 Unchanged enlarged mediastinal and bilateral hilar lymph nodes,  which remain nonspecific   Autoimmune Work up from [employer] 01/2020  Rheumatoid factor was negative quantifiron  negative ANA was negative angiotensin  converting enzyme was negative anti-SSA antibody and anti-SSB antibody were negative ESR was 17 mm/hr factor V Leiden test was negative  Mediastinoscopy lymph node biopsy >> noncaseating granulomas.  Special stains and cultures were negative for AFB and fungus  INAL MICROSCOPIC DIAGNOSIS:  A. LYMPH NODE, 4R, BIOPSY:  - Non-necrotizing granulomatous.  - See comment.   B. LYMPH NODE, 4R #2, BIOPSY:  - Non-necrotizing granulomas.  - See comment.   C. LYMPH NODE, MEDIASTINAL, BIOPSY:  - Non-necrotizing granuloma.  - See comment.   COMMENT:  The differential includes sarcoid and infectious granulomatous.  Correlate with Gram stain and microbiological studies.  Special stains  will be performed and reported as an addendum.    PMH - RLE DVT in 10/2019   Underwent hematology evaluation and detailed hypercoagulable work-up which was mostly negative except PCR-ABL positive .  bone marrow biopsy 02/2020 neg 04/2021 NSTEMI, hosp adm for hypoxia  CTA Chest 08/07/2024: IMPRESSION: 1. No filling defect is identified in the pulmonary arterial tree to suggest pulmonary embolus. 2. Enlarged mediastinal and hilar lymph nodes, mildly increased in size from 06/16/2021. Appearance compatible with known diagnosis of sarcoidosis. 3. Mild bilateral airway thickening. 4. Scarring or atelectasis in both lower lobes and the lingula.  CT Chest 2022: IMPRESSION: 1. No acute cardiopulmonary disease. Specifically, no evidence of pulmonary embolism. 2. 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 3. Borderline cardiomegaly, unchanged.  4. Minimal coronary artery calcifications. Aortic Atherosclerosis (ICD10-I70.0). 5. Suspected hepatic  steatosis.  Correlation with LFTs is advised.    Assessment & Plan:   Assessment & Plan   No orders of the defined types were placed in this encounter.     No follow-ups on file.   Zachary Waring, MD    [1]  Current Outpatient Medications:    albuterol  (VENTOLIN  HFA) 108 (90 Base) MCG/ACT inhaler, Inhale 2-3 puffs into the lungs every 6 (six) hours as needed for wheezing or shortness of breath., Disp: , Rfl:    amLODipine  (NORVASC ) 5 MG tablet, TAKE 1 TABLET BY MOUTH DAILY, Disp: 15 tablet, Rfl: 0   atorvastatin  (LIPITOR ) 80 MG tablet, TAKE 1 TABLET BY MOUTH DAILY, Disp: 15 tablet, Rfl: 0   Cholecalciferol  (VITAMIN D3) 1000 units CAPS, Take 1,000 Units by mouth in the morning., Disp: , Rfl:    cyclobenzaprine  (FLEXERIL ) 10 MG tablet, Take 10 mg by mouth daily as needed for muscle spasms., Disp: , Rfl:    empagliflozin (JARDIANCE) 25 MG TABS tablet, Take 25 mg by mouth daily. Pt reports GI provider told him to stop all DM meds due to GI issues he was having. (Patient not taking: Reported on 08/16/2024), Disp: , Rfl:    metoprolol  tartrate (LOPRESSOR ) 50 MG tablet, TAKE 1 TABLET BY MOUTH TWICE DAILY, Disp: 30 tablet, Rfl: 0   pantoprazole  (PROTONIX ) 40 MG tablet, TAKE 1 TABLET BY MOUTH TWICE DAILY, Disp: 180 tablet, Rfl: 3  "

## 2024-11-29 ENCOUNTER — Ambulatory Visit: Admitting: Pulmonary Disease

## 2024-11-29 ENCOUNTER — Encounter: Payer: Self-pay | Admitting: Pulmonary Disease

## 2024-11-29 VITALS — BP 140/93 | HR 81 | Ht 71.0 in | Wt 321.0 lb

## 2024-11-29 DIAGNOSIS — G4733 Obstructive sleep apnea (adult) (pediatric): Secondary | ICD-10-CM | POA: Diagnosis not present

## 2024-11-29 DIAGNOSIS — Z6841 Body Mass Index (BMI) 40.0 and over, adult: Secondary | ICD-10-CM | POA: Diagnosis not present

## 2024-11-29 DIAGNOSIS — D860 Sarcoidosis of lung: Secondary | ICD-10-CM

## 2024-11-29 DIAGNOSIS — J4530 Mild persistent asthma, uncomplicated: Secondary | ICD-10-CM | POA: Diagnosis not present

## 2024-11-29 MED ORDER — FLUTICASONE-SALMETEROL 100-50 MCG/ACT IN AEPB: 1.0000 | INHALATION_SPRAY | Freq: Two times a day (BID) | RESPIRATORY_TRACT | 6 refills | Status: AC

## 2024-11-29 NOTE — Patient Instructions (Signed)
" °  VISIT SUMMARY: During your visit, we discussed your persistent low blood oxygen levels, asthma exacerbation, and sarcoidosis. We also addressed your severe obstructive sleep apnea and the challenges with CPAP therapy.  YOUR PLAN: SARCOIDOSIS OF LUNG: You have sarcoidosis with lymph node involvement and potential early scarring in your lungs. There is no pulmonary fibrosis. -We have ordered a high-resolution CT scan of your chest for February. -We have also ordered a breathing test and an arterial blood gas (ABG) test to assess your oxygen levels.  MILD PERSISTENT ASTHMA: You experienced a recent asthma exacerbation that required hospitalization. Your symptoms include wheezing, chest tightness, and a dry cough, with oxygen levels dropping during exertion. -We have prescribed Wixela as a maintenance inhaler to help manage your asthma. -We have ordered a breathing test to reassess your lung function.  SEVERE OBSTRUCTIVE SLEEP APNEA: You have severe obstructive sleep apnea and have not tolerated CPAP therapy well. -We discussed alternative treatments, including a dental appliance and the Inspire device. You prefer the dental appliance. -We have referred you to a dentist for evaluation and fitting of a dental appliance for sleep apnea.  Contains text generated by [employer]. "

## 2024-11-29 NOTE — Assessment & Plan Note (Signed)
 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 CTA done on 07/2024 does not show significant evidence of fibrotic change or nodularities. Lymphadenopathy increased since 2022, possibly reactive due to recent asthma exacerbation. - Ordered high-resolution CT scan of the chest to evaluate lymphadenopathy. If enlarging, may consider starting immunosuppressive therapies for sarcoidosis. - PFT with ABG before next follow up

## 2024-12-05 ENCOUNTER — Other Ambulatory Visit: Payer: Self-pay | Admitting: Cardiology

## 2024-12-06 NOTE — Telephone Encounter (Signed)
 Looks like he has follow-up with me in March.  Okay to send 73-month supply on all medications, further refill can be discussed at office visit in March.  Thanks MJP

## 2024-12-06 NOTE — Telephone Encounter (Signed)
 In accordance with refill protocols, please review and address the following requirements before this medication refill can be authorized:  Labs

## 2024-12-21 ENCOUNTER — Other Ambulatory Visit (HOSPITAL_BASED_OUTPATIENT_CLINIC_OR_DEPARTMENT_OTHER): Payer: Self-pay

## 2024-12-21 MED ORDER — PREDNISONE 20 MG PO TABS
ORAL_TABLET | ORAL | 0 refills | Status: AC
  Filled 2024-12-21: qty 10, 7d supply, fill #0

## 2024-12-25 ENCOUNTER — Ambulatory Visit: Admitting: Urology

## 2024-12-25 ENCOUNTER — Other Ambulatory Visit: Payer: Self-pay | Admitting: Gastroenterology

## 2024-12-26 NOTE — Telephone Encounter (Signed)
 Phoned and LMOVM for the pt to return call or MyChart us  regarding the Rx for Dicyclomine 

## 2025-01-22 ENCOUNTER — Ambulatory Visit: Admitting: Urology

## 2025-02-02 ENCOUNTER — Ambulatory Visit: Admitting: Cardiology

## 2025-02-06 ENCOUNTER — Encounter (HOSPITAL_COMMUNITY)

## 2025-02-06 ENCOUNTER — Other Ambulatory Visit (HOSPITAL_COMMUNITY)

## 2025-02-26 ENCOUNTER — Ambulatory Visit: Admitting: Pulmonary Disease
